# Patient Record
Sex: Male | Born: 1955 | Race: White | Hispanic: No | Marital: Married | Smoking: Former smoker
Health system: Southern US, Community
[De-identification: ages and names within clinical notes are randomized; demographics above are authoritative.]

## PROBLEM LIST (undated history)

## (undated) DIAGNOSIS — M549 Dorsalgia, unspecified: Secondary | ICD-10-CM

## (undated) DIAGNOSIS — F1120 Opioid dependence, uncomplicated: Secondary | ICD-10-CM

## (undated) DIAGNOSIS — G8929 Other chronic pain: Secondary | ICD-10-CM

## (undated) DIAGNOSIS — C801 Malignant (primary) neoplasm, unspecified: Secondary | ICD-10-CM

## (undated) DIAGNOSIS — I639 Cerebral infarction, unspecified: Secondary | ICD-10-CM

## (undated) DIAGNOSIS — I10 Essential (primary) hypertension: Secondary | ICD-10-CM

## (undated) DIAGNOSIS — E785 Hyperlipidemia, unspecified: Secondary | ICD-10-CM

## (undated) HISTORY — PX: CHOLECYSTECTOMY: SHX55

## (undated) HISTORY — PX: APPENDECTOMY: SHX54

## (undated) HISTORY — PX: BACK SURGERY: SHX140

---

## 1987-12-02 DIAGNOSIS — G8929 Other chronic pain: Secondary | ICD-10-CM

## 1987-12-02 HISTORY — DX: Other chronic pain: G89.29

## 2000-11-19 ENCOUNTER — Emergency Department (HOSPITAL_COMMUNITY): Admission: EM | Admit: 2000-11-19 | Discharge: 2000-11-19 | Payer: Self-pay | Admitting: Emergency Medicine

## 2003-01-25 ENCOUNTER — Emergency Department (HOSPITAL_COMMUNITY): Admission: EM | Admit: 2003-01-25 | Discharge: 2003-01-25 | Payer: Self-pay | Admitting: Emergency Medicine

## 2003-01-25 ENCOUNTER — Encounter: Payer: Self-pay | Admitting: Emergency Medicine

## 2003-04-12 ENCOUNTER — Emergency Department (HOSPITAL_COMMUNITY): Admission: EM | Admit: 2003-04-12 | Discharge: 2003-04-12 | Payer: Self-pay | Admitting: Emergency Medicine

## 2003-04-15 ENCOUNTER — Emergency Department (HOSPITAL_COMMUNITY): Admission: EM | Admit: 2003-04-15 | Discharge: 2003-04-15 | Payer: Self-pay

## 2003-09-13 ENCOUNTER — Encounter: Payer: Self-pay | Admitting: Internal Medicine

## 2003-09-13 ENCOUNTER — Encounter: Admission: RE | Admit: 2003-09-13 | Discharge: 2003-09-13 | Payer: Self-pay | Admitting: Internal Medicine

## 2003-10-27 ENCOUNTER — Encounter (INDEPENDENT_AMBULATORY_CARE_PROVIDER_SITE_OTHER): Payer: Self-pay | Admitting: *Deleted

## 2003-10-27 ENCOUNTER — Ambulatory Visit (HOSPITAL_COMMUNITY): Admission: RE | Admit: 2003-10-27 | Discharge: 2003-10-27 | Payer: Self-pay | Admitting: General Surgery

## 2004-03-18 ENCOUNTER — Ambulatory Visit (HOSPITAL_COMMUNITY): Admission: RE | Admit: 2004-03-18 | Discharge: 2004-03-18 | Payer: Self-pay | Admitting: Specialist

## 2004-03-18 ENCOUNTER — Ambulatory Visit (HOSPITAL_BASED_OUTPATIENT_CLINIC_OR_DEPARTMENT_OTHER): Admission: RE | Admit: 2004-03-18 | Discharge: 2004-03-18 | Payer: Self-pay | Admitting: Specialist

## 2004-03-18 ENCOUNTER — Encounter (INDEPENDENT_AMBULATORY_CARE_PROVIDER_SITE_OTHER): Payer: Self-pay | Admitting: Specialist

## 2006-08-06 ENCOUNTER — Inpatient Hospital Stay (HOSPITAL_COMMUNITY): Admission: EM | Admit: 2006-08-06 | Discharge: 2006-08-08 | Payer: Self-pay | Admitting: Emergency Medicine

## 2006-08-07 ENCOUNTER — Encounter: Payer: Self-pay | Admitting: Vascular Surgery

## 2006-09-07 ENCOUNTER — Emergency Department (HOSPITAL_COMMUNITY): Admission: EM | Admit: 2006-09-07 | Discharge: 2006-09-07 | Payer: Self-pay | Admitting: Emergency Medicine

## 2010-01-18 ENCOUNTER — Emergency Department (HOSPITAL_COMMUNITY): Admission: EM | Admit: 2010-01-18 | Discharge: 2010-01-18 | Payer: Self-pay | Admitting: Emergency Medicine

## 2011-04-18 NOTE — H&P (Signed)
NAME:  Gerald Adkins, Gerald Adkins                ACCOUNT NO.:  [id number]   MEDICAL RECORD NO.:  [id number]          PATIENT TYPE:  INP   LOCATION:  1824                         FACILITY:  [facility]   PHYSICIAN:  Melissa L. Ladona Ridgel, MD  DATE OF BIRTH:  1956/01/25   DATE OF ADMISSION:  08/06/2006  DATE OF DISCHARGE:                                HISTORY & PHYSICAL   CHIEF COMPLAINT:  Chest pain with questionable syncope.   HISTORY OF PRESENT ILLNESS:  The patient is a 55 year old white male with a  past medical history of chronic back who states that this afternoon, he  developed the acute onset of chest pain.  This was associated with confusion  and blurred vision.  The patient was working on his car.  He went into the  house to have a bowel movement which seemed to come on him quite quickly.  He states that he had bright red blood per rectum related to this bowel  movement.  He subsequently got up and back to the car to work, when the pain  hit him as he was walking toward the car.  He felt like he was going to fall  down on the ground.  He picked up his cell phone quickly and called for  help.  The patient states that since early May, he has been having on and  off chest pain with activity but he just put it off and decided to change  some of his lifestyle issues to be more heart healthy.  The patient does  have a history of anxiety attacks, but states this is not related.  He  states that he has, during these episodes, gotten short of breath and so he  has unconnected the acetylene from his torches and used the O2 to breathe  which has improved his symptoms.  The patient appears to be reading up on  his medical health and applying certain principals that he is convinced are  applicable to his case.  The patient states he changed his diet.  He does  not eat red meat.  At this point, he states he had to come to the emergency  room because this was worse than previous and different than the last  time.   While in the emergency room, the patient was having an IV inserted when he  developed the acute onset of nausea, sweating, and acutely lost  consciousness and potentially had a respiratory arrest.  The patient was  awakened shortly thereafter, a monitor was not attached and, therefore, we  cannot confirm his rhythm.  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  The patient states he  came around quickly from the event in the dental office, but this sounded  similar to that event.  The patient has had no subsequent chest pain while  in the emergency room, but does complain of some mild abdominal discomfort.   REVIEW OF SYSTEMS:  Chronic pain, see HPI.  He denies any recent weight loss  or gain.  He has not had any nausea and vomiting other than the events  related to today.  All other review of systems appears to be negative.   PAST MEDICAL HISTORY:  A broken back, chronic back pain.  He denies  diabetes.  He states he has slight hypertension.   PAST SURGICAL HISTORY:  Back surgery 16 years ago, appendectomy, and a  cholecystectomy.   SOCIAL HISTORY:  The patient has quit tobacco eight years ago.  He also quit  drinking very heavily a couple of years ago.  He states now he may have 1-2  drinks a week whereas before, he was out in the bars every night.  The  patient is currently disabled.   FAMILY HISTORY:  Mom is living and has some form of cancer which he is not  aware of what type.  A sister has had breast cancer.  His dad is unknown to  him.  He has one son who is married.   ALLERGIES:  No known drug allergies.   MEDICATIONS:  He takes herbs and occasionally purchases some hydrocodone  from a friend for his back pain.   PHYSICAL EXAMINATION:  VITAL SIGNS:  Temperature 96.8, blood pressure 147/86, pulse 64,  respirations 12, saturation 98%.  GENERAL:  Well developed, well nourished, white male in no acute  distress.  HEENT:  Normocephalic, atraumatic, pupils equal, round, reactive to light,  extraocular movements intact, he has poor dentition.  NECK:  Supple, no JVD, no lymph nodes, no carotid bruits.  CHEST:  Decreased but clear.  CARDIOVASCULAR:  Regular rate and rhythm, positive S1 and S2, no S3 or S4,  no murmurs, gallops, and rubs.  ABDOMEN:  Soft with minimal tenderness in the left lower quadrant.  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  The patient states his  mother was a heavy drinker during her pregnancy and he is not aware if she  had been taking thalidomide.Marland Kitchen  NEUROLOGICAL:  The patient is awake, alert and oriented.  Cranial nerves 2-  12 are intact.  Power 5/5.  DTRs 2+.  Plantars are downgoing.  RECTAL:  Exam was requested of the patient in order to examine the bright  red blood, the patient refuses at this time.   He also states he will not take a proton pump inhibitor as it messes him  up.  The patient had been reading up in his health text book and read that  he needed acid for his stomach to feel better, that the book told him that  if he drank apple juice his stomach would improve which is what he has been  doing and he has had no symptoms since.  At this time, he refuses to take  proton pump inhibitor.   LABORATORY DATA:  White count 8.3, hemoglobin 15.3, hematocrit 45.1,  platelets are 284.  Sodium 137, potassium 4.1, chloride 104, CO2 29, BUN 11,  creatinine 0.8.  Point of care enzymes are negative x2.  D-dimer is less  than 0.22.  Chest x-ray is negative.   ASSESSMENT AND PLAN:  This is a 55 year old white male with acute chest pain  and syncope/near syncope.  He also had what appeared to be a syncopal event  versus a respiratory arrest while having IV manipulated in the emergency room.  The patient reports  bright red blood per  rectum with associated loose  stools this afternoon.  At this time, he defers request for rectal exam  secondary to pain.  He also relates he will not take a proton pump  inhibitor as stated above.  At this time, the patient will be admitted to  step down for closer monitoring and will undergo cardiac catheterization as  per cardiology in the a.m.   1. Cardiovascular:  I appreciated Dr. Elvis Coil consultation.  With respect      to the recent syncope and previous history of flat line and/or      syncope I will recommend that the patient be placed in cardiac step      down tonight.  I agree with Lopressor, a lipid panel.  He also may need      to have the aspirin discontinued if he continues to have rectal      bleeding.  2. Pulmonary:  His lungs are clear, his chest x-ray is negative, and his D-      dimer is within normal limits.  I doubt, therefore, that he has a      pulmonary embolus.  I will not be doing a CT chest tonight as he will      be having cardiac catheterization in the morning.  Should he develop      further symptoms, we may need to consider this.  3. GI:  Rectal bleeding, the patient, as stated, defers PPI and rectal      examination.  I will, however, obtain a CT scan of the abdomen and      pelvis without IV contrast in light of his cardiac catheterization in      the a.m.  I will, however, use oral contrast to see if there is any      bowel thickening or areas suggesting possible diverticulitis or related      disease process.  I will request the primary service try to encourage      the patient to take a proton pump inhibitor.  We will also heme check      stools and if he has more bleeding, will repeat his hemoglobin and      hematocrit and consider him for transfusion if he drops his hemoglobin.  4. GU:  There are no complaints.  5. Endocrine:  His blood sugars appear stable on his BMET, will check a      lipid panel and TSH.      Melissa  L. Ladona Ridgel, MD  Electronically Signed     MLT/MEDQ  D:  08/06/2006  T:  08/06/2006  Job:  [id number]

## 2011-04-18 NOTE — Discharge Summary (Addendum)
Gerald Adkins, Gerald Adkins                ACCOUNT NO.:  [id number]   MEDICAL RECORD NO.:  [id number]          PATIENT TYPE:  INP   LOCATION:  6526                         FACILITY:  [facility]   PHYSICIAN:  Jackie Plum, M.D.DATE OF BIRTH:  06/02/1956   DATE OF ADMISSION:  08/06/2006  DATE OF DISCHARGE:  08/08/2006                                 DISCHARGE SUMMARY   PREOPERATIVE DIAGNOSIS:  Chest pain, resolved.   POSTOPERATIVE DIAGNOSES:  Cardiac catheterization indicated RCA stenosis  status post PTCA.  One-vessel coronary artery disease  Dyslipidemia.  Hypertension.  Congenital defect of left hand.   PROCEDURES:  Cardiac catheterization done by Dr. Swaziland on August 07, 2006.  Showed single-vessel obstructive atherosclerotic coronary artery  disease with normal ventricular function.  Status post PTCA of the mid right  coronary artery.  There was unsuccessful PTCA of the distal right coronary  at the crux due to inability to pass a wire into the true lumen of the  vessel.   CONSULTANT:  Dr. Swaziland of cardiology.   CONDITION ON DISCHARGE:  Satisfactory.   REASON FOR ADMISSION:  Chest pain.  Patient presented with chest pain to the  ED on August 06, 2006.  He is said to have had a questionable  episode/spell which is unclear whether it is truly a syncopal episode or  not.  Please see full details regarding patient's presenting symptoms by  reviewing the H&P dictated by Dr. Derenda Mis for full details regarding  patient's presentation.   On admission, patient's cardiac/pulmonary auscultation was unremarkable.  His EKG was normal.  At point of care, cardiac markers were negative.  Chest  pain was consistent with angina and therefore cardiology was consulted.  Patient was seen in consultation by Dr. Swaziland on the day of admission.  He  recommended admitting patient to telemetry to rule out myocardial  infarction, which was effected.  Lipid panel was obtained which indicated  a  total cholesterol of 201 with triglycerides of 160, HDL of 36 which was low,  and LDL of 133 which was high.  He was started on Zocor and he ruled out for  MI.  On account of typical angina presentation, cardiac catheterization was  done which showed above.  Patient has been ambulatory overnight without any  problems.  She had been off her nitroglycerin drip and she had been fine.  Has been cleared by cardiology for discharge today.   Patient is going to be discharged.  To follow up with his PCP, Dr. Concepcion Elk  in about 10-14 days.   He will be continued on:  1. Aspirin 325 mg p.o. daily.  2. Imdur 30 mg daily.  3. ____ QA MARKER: 257 ____ 12.5   4. Lisinopril 40 mg daily.  5. Zocor 40 mg daily.   He will need medical management of his coronary artery disease and continued  followup of his dyslipidemia.  We did monitor his LFTs on statins.  Patient  was discharged home in satisfactory condition.      Jackie Plum, M.D.  Electronically Signed     GO/MEDQ  D:  08/08/2006  T:  08/09/2006  Job:  [id number]   cc:   Fleet Contras, M.D.  Peter M. Swaziland, M.D.

## 2011-04-18 NOTE — Discharge Summary (Signed)
Gerald Adkins, Gerald Adkins                ACCOUNT NO.:  [id number]   MEDICAL RECORD NO.:  [id number]          PATIENT TYPE:  EMS   LOCATION:  MINO                         FACILITY:  [facility]   PHYSICIAN:  Hollice Espy, M.D.DATE OF BIRTH:  [birth year]   DATE OF ADMISSION:  09/07/2006  DATE OF DISCHARGE:  09/07/2006                                 DISCHARGE SUMMARY   DATE OF ER VISIT:  September 06, 2006.   ATTENDING PHYSICIAN:  Dr. Virginia Rochester.   PRIMARY CARE PHYSICIAN:  Patient has no PCP but being referred to Dr. Lucita Ferrara of [place] at [facility] as well as referral to [facility].   HOSPITAL COURSE:  Patient is a 54 year old white male with past medical  history of appendectomy and cholecystectomy in the past who presents to the  emergency room complaining of some abdominal pain.  He also had been  complaining of some rectal bleeding which seemed to be a separate complaint  from his rectal bleeding.  Patient was admitted.  Labs were checked on the  patient who was found to have essentially completely normal labs including a  negative urinalysis, white count, electrolytes, BUN and creatinine.  CT of  the abdomen was done and showed a proximal jejunal ileus and partial  obstruction with evidence of mucosal thickening.  This mucosal thickening  due to lymphostasis could be due to obstruction, infection, inflammation, or  ischemia.  Patient was also noted to have some mild cardiomegaly and some  stable mild bronchitic changes.  The plan initially was to admit the patient  for a partial small bowel obstruction, however, due to a nursing error in  the emergency room, patient was given a tray of food.  He actually ate the  whole thing and told me that he felt much, much better after he received  this.  His belly was feeling markedly better.  He was observed for 1 hour  post meal and had no problems or complaints.  At this point, it was felt  that he could be sent home.   In addition, the ER attending had evaluated the  patient and he was found to have a significant large amount of well sized  hemorrhoids which was felt to be cause of patient's rectal bleeding.  Currently, patient states he is doing fine, denies any headaches, vision  changes, dysphagia, chest pain, palpitations, shortness of breath, wheeze,  cough, abdominal pain, hematuria, dysuria, constipation, diarrhea.  No gross  extremity numbness, weakness, or pain.  Review of systems is otherwise  negative.   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   MEDICATIONS:  None.   ALLERGIES:  He has no known drug allergies.   SOCIAL HISTORY:  He is a smoker, he denies any alcohol or drug use.   FAMILY HISTORY:  Non contributory.   PHYSICAL EXAMINATION:  Patient's  vitals on admission:  Temp 98.0, heart rate  90 since then it has come down to about 84, blood pressure 165/100 came to  as low as 151/90.  Respirations 20, O2 sat 98% on room air.  In general,  patient is alert and oriented x3, no apparent distress.  HEENT:  Normocephalic, atraumatic.  His mucous membranes are moist.  He has  no carotid bruits.  HEART:  Regular rate and rhythm.  S1, S2.  LUNGS:  Clear to auscultation bilaterally.  ABDOMEN:  Soft, mild distention but no tenderness and he does have positive  bowel sounds.  EXTREMITIES:  There is no clubbing or cyanosis or edema.  He has his chronic  left forearm deformity.   LABORATORY DATA:  White count 9.2, no shift.  H and H 12.6, 37.4, CVA 89,  platelet count 259.  UA unremarkable.  Sodium 139, potassium 6.9, chloride  108, bicarbonate 25, BUN 9, creatinine 0.9, glucose 91.  CT is as per HPI.   ASSESSMENT/PLAN:  1. Small bowel obstruction:  Self resolved and he has already been  able to      tolerate p.o.  Plan to discharge the patient home.  2. Hypertension:  His blood pressure has been elevated during this      hospitalization, although, some of this may have been initially due to      pain, but he does have cardiomegaly on his chest x-ray.  He did not      have a PCP and I am going to refer him to Dr. Flonnie Overman so he may evaluate      him, repeat his blood pressure check, and likely start him on blood      pressure medication.  3. Tobacco abuse:  Patient needs counseling and right now has no desire to      quit.  4. Hemorrhoids:  Plan to refer the patient to [facility] for      followup so that he may be able to have these managed.      Hollice Espy, M.D.  Electronically Signed     SKK/MEDQ  D:  09/07/2006  T:  09/08/2006  Job:  [id number]   cc:   Lucita Ferrara, MD  [facility]

## 2011-04-18 NOTE — Op Note (Signed)
NAME:  Gerald Adkins, Gerald Adkins                          ACCOUNT NO.:  [id number]   MEDICAL RECORD NO.:  [id number]                   PATIENT TYPE:  AMB   LOCATION:  DSC                                  FACILITY:  [facility]   PHYSICIAN:  Earvin Hansen L. Shon Hough, M.D.           DATE OF BIRTH:  [birth year]   DATE OF PROCEDURE:  03/18/2004  DATE OF DISCHARGE:                                 OPERATIVE REPORT   INDICATION:  [age]-year-old gentleman with enlarging mass involving his  mid-back, increased pain and discomfort;  the patient has increased pain  such that he cannot lie on it and the area has gotten larger.   PROCEDURES DONE:  Exploration, excision of mass with some difficulty and  plastic closure.   SURGEON:  Yaakov Guthrie. Shon Hough, M.D.   ANESTHESIA:  MAC anesthesia supplemented by 0.5% Xylocaine with epinephrine  1:100,000 concentration, a total of 20 mL.   DESCRIPTION OF PROCEDURE:  Prep was done with Betadine soaping solution and  walled off with sterile towels and drapes so as to make a sterile field.  The mass just measured approximately 4 x 6 cm and was dealt with by making  an incision across transversely down to underlying subcutaneous tissue.  Dissection was carried down with difficulty with Metzenbaum scissors, down  to the underlying fascia, with the mass lying under the fascia of the  musculature.  The fascia was opened and then with delicate dissection, we  were able to tease the mass out, which had a consistency of a sebaceous  cyst.  We were able to do it circumferentially and under it, down to  underlying deeper tissue.  Hemostasis was maintained with the Bovie unit on  coagulation.  The mass did have a feeder vessel coming in from below with a  large diameter; this was ligated with a hemostat and then Bovie on  coagulation.  After this, irrigation was done and all small bleeders were  then controlled again with the Bovie unit on coagulation.  Next, the fascia  was reclosed  with 3-0 Vicryl, the subcutaneous tissue closed with 3-0 Vicryl  x2 layers, a subdermal suture of 5-0 Vicryl was placed and then a running  subcuticular stitch of 5-0 Vicryl.  Steri-Strips and soft dressings were  applied.  A #7 Blake drain was placed in the depths of the wound and brought  out through the lateral-most portion of the incision and secured with 3-0  Prolene.  After a sterile dressing had been placed, the patient was placed  in the bed and taken to the recovery in excellent condition.   ESTIMATED BLOOD LOSS:  Less than 50 mL.   COMPLICATIONS:  None.  Yaakov Guthrie. Shon Hough, M.D.    Cathie Hoops  D:  03/18/2004  T:  03/19/2004  Job:  [id number]

## 2011-04-18 NOTE — Consult Note (Signed)
NAME:  EUCLID, CASSETTA NO.:  [id number]   MEDICAL RECORD NO.:  [id number]          PATIENT TYPE:  EMS   LOCATION:  MAJO                         FACILITY:  [facility]   PHYSICIAN:  Peter M. Swaziland, M.D.  DATE OF BIRTH:  [birth year]   DATE OF CONSULTATION:  08/06/2006  DATE OF DISCHARGE:                                   CONSULTATION   HISTORY OF PRESENT ILLNESS:  Mr. Mcnab is a 55 year old white male who  presented to the emergency department today for evaluation of chest pain.  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  He describes the pain in the midchest as if some  heavy weight is sitting on his chest associated with shortness of breath,  sweating, nausea and weakness.  He states he has had similar episodes of  chest pain throughout the summer with exertion that typically resolve with  rest but this episode was more severe and lasted longer.  He also had some  associated tingling and numbness in his right leg.   In the emergency department, the nurse had just started an IV in his left  arm.  She was talking with the patient when he suddenly stopped breathing  and became unresponsive.  She believes the patient was asystolic on the  monitor but no recording was made.  With stimulation the patient start  breathing again and came to.   PAST MEDICAL HISTORY:  1. Borderline hypertension.  2. History of fracture of his back.  3. Congenital defect of his left hand.   MEDICATIONS:  Include p.r.n. hydrocodone he takes psyllium hulls and cayenne  pepper tablets over-the-counter.   ALLERGIES:  He is allergic to BUTTERSCOTCH.   FAMILY HISTORY:  Positive for cancer in multiple family members.  There is  no history of coronary disease.  He does not know his father's history.   SOCIAL HISTORY:  He is disabled.  He is married.  He has one son.  He has a  33 pack-year history of smoking but quit 8 years ago.  Drinks  alcohol  approximately two times per week.   REVIEW OF SYSTEMS:  The patient does report chronic history of rectal  bleeding, now occurring approximately one to two times per month.  Other  review of systems is negative.   PHYSICAL EXAM:  Patient is well-developed white male in no distress.  He  appears older than his stated age.  Blood pressure 147/86, pulse 64, sinus rhythm.  He is afebrile.  Sats are  98%.  HEENT:  He has a heavy beard.  His pupils equal round reactive light  accommodation.  Extraocular movements were full.  Oropharynx is clear.  Neck  is supple without JVD, adenopathy, thyromegaly or bruits.  LUNGS:  Were clear to auscultation percussion.  CARDIAC:  Exam reveals regular rate and rhythm without gallop, murmur, rub  or click.  ABDOMEN:  Soft, nontender without masses or hepatosplenomegaly.  Femoral pedal pulses were 2+ and symmetric.  NEUROLOGIC:  Exam is nonfocal.   LABORATORY  DATA:  ECG is normal.  Chest x-ray:  Shows no active disease.  White count 8300, hemoglobin 15.3, hematocrit 45.1, platelets 284,000.  Sodium is 137, potassium 4.1, chloride 104, BUN 11, glucose 103, creatinine  is 0.8.  CPK-MB and troponins were negative x2. D-dimer is less than 0.22.   IMPRESSION:  1. Chest pain history is very consistent with stable angina pectoris.  2. Prior tobacco use.  3. Elevated blood pressure  4. History of rectal bleeding.  5. Apparent episode of asystole respiratory arrest resolve spontaneously.      Question vagally mediated since it occurred after initiation of IV.   PLAN:  Will admit to telemetry, rule out myocardial infarction, be treated  with aspirin, Lovenox subcu, Lopressor and ACE inhibitor. Will check lipid  panel.  We will plan on doing cardiac catheterization tomorrow to evaluate  his coronary status.           ______________________________  Peter M. Swaziland, M.D.     PMJ/MEDQ  D:  08/06/2006  T:  08/07/2006  Job:  [id number]   cc:   Jackie Plum, M.D.

## 2011-04-18 NOTE — Op Note (Signed)
NAME:  Gerald Adkins, Gerald Adkins                          ACCOUNT NO.:  [id number]   MEDICAL RECORD NO.:  [id number]                   PATIENT TYPE:  OIB   LOCATION:  NA                                   FACILITY:  [facility]   PHYSICIAN:  Jimmye Norman III, M.D.               DATE OF BIRTH:  1956-08-12   DATE OF PROCEDURE:  10/27/2003  DATE OF DISCHARGE:                                 OPERATIVE REPORT   PREOPERATIVE DIAGNOSIS:  Symptomatic cholelithiasis.   POSTOPERATIVE DIAGNOSIS:  Symptomatic cholelithiasis.   PROCEDURE:  Laparoscopic cholecystectomy.   SURGEON:  Jimmye Norman, M.D.   ASSISTANT:  None.   ANESTHESIA:  General endotracheal.   ESTIMATED BLOOD LOSS:  Less than 20 mL.   COMPLICATIONS:  None.   CONDITION:  Stable.   INDICATIONS FOR OPERATION:  The patient is a 55 year old male with abdominal  pain, right upper quadrant, and gallstones on ultrasound.  He comes in for a  laparoscopic cholecystectomy.   FINDINGS:  The patient had adhesions to the infundibulum of the gallbladder,  which were easily removed, including the duodenum.  He had two large stones  within the gallbladder and a normal-size common duct and cystic duct.   OPERATION:  The patient was taken to the operating room and placed on the  table in the supine position.  After an adequate endotracheal anesthetic was  administered, he was prepped and draped in the usual sterile manner,  exposing the midline in the right upper quadrant.   With the patient in reverse Trendelenburg position, a supraumbilical  curvilinear incision was made using a #11 blade.  It was taken down to the  midline fascia, through which a Veress needle was passed into the peritoneal  cavity and confirmed to be in position with the saline test.  Once this was  done, carbon dioxide insufflation was instilled through the Veress needle  into the peritoneal cavity up to a maximum intra-abdominal pressure of 15  mmHg.   Subsequently, two right  costal margin 5 mm cannulas and a subxiphoid 11-12  mm cannula were passed under direct vision into the peritoneal cavity.  With  all cannulas in place, the patient was placed in a steeper reversed  Trendelenburg.  The left side was tilted down and the dissection begun.   Because of the adhesions to the infundibulum of the gallbladder, we had to  be careful with retraction initially through the lateralmost 5 mm cannula.  We did take it towards the right upper quadrant and the anterior abdominal  wall.  However, as we were doing so, we did an adhesiolysis of those  adhesions, freeing up the infundibulum for a better retraction.  We were  then able to expose the peritoneum overlying the triangle of Calot and  hepatoduodenal triangle.  We dissected up the cystic duct and the cystic  artery and these peritoneal structures and isolated them well enough  to see  windows on both sides above the artery and the duct.  We placed Endoclips on  both the cystic duct and the cystic artery proximally and distally with  triple clips on the cystic duct.  We subsequently transected it and then  dissected out the gallbladder from its bed with minimal difficulty.  There  was no entrance into the gallbladder itself.   We then brought the gallbladder out the supraumbilical site with minimal  difficulty, although we did have to stretch the fascia a bit because of the  large size of the stones.  We inspected the gallbladder bed where there was  minimal bleeding.  We did cauterize somewhat; however, we used less than a  bag of irrigation for hemostasis and irrigation.  There was no biliary  spillage or leakage.  Once the irrigation and coagulation were done, we  removed all cannulas, aspirating above the liver as we did so.  We removed  all cannulas and then closed the supraumbilical site using a figure-of-eight  stitch of 0 Vicryl on a UR-6 needle.  We then injected at all sites with  0.25% Marcaine with  epinephrine and closed the skin using running  subcuticular stitch of 4-0 Vicryl.  All needle counts, sponge counts, and  instrument counts were correct.  The patient was taken to the recovery room  in a stable condition with sterile dressings applied to all wounds.                                               Kathrin Ruddy, M.D.    JW/MEDQ  D:  10/27/2003  T:  10/27/2003  Job:  [id number]

## 2011-04-18 NOTE — Cardiovascular Report (Signed)
NAME:  BROWN, DUNLAP NO.:  [id number]   MEDICAL RECORD NO.:  [id number]          PATIENT TYPE:  INP   LOCATION:  2807                         FACILITY:  [facility]   PHYSICIAN:  Peter M. Swaziland, M.D.  DATE OF BIRTH:  June 18, 1956   DATE OF PROCEDURE:  DATE OF DISCHARGE:                              CARDIAC CATHETERIZATION   INDICATIONS FOR PROCEDURE:  A 55 year old male with hypercholesterolemia and  prior tobacco abuse who presents with unstable angina.   PROCEDURES:  1. Left heart catheterization.  2. Coronary and left ventricular angiography.  3. Percutaneous balloon angioplasty of the right coronary artery.   ACCESS:  Via the right femoral artery using the standard Seldinger  technique.   EQUIPMENT:  A 6-French 4-cm right and left Judkins catheter, 6-French  pigtail catheter, 6-French arterial sheath, 6-French FR-4 guide with side  holes 0.014.  PT2 moderate strength wire, a 2.5 x 20-mm Quantum balloon, and  a 3 x 15-mm Maverick balloon.   CONTRAST:  Omnipaque 260 ml.   MEDICATIONS:  1. Versed a total of 3 mg IV.  2. Fentanyl 25 mcg IV.  3. Integrilin double bolus 180 mcg/kg followed by continuous infusion of 2      mcg/kg per minute.  4. Nitroglycerin 200 mcg intracoronary x3.  5. Heparin 4500 units IV.  6. Atropine 0.5 mg IV.   HEMODYNAMIC DATA:  1. Aortic pressure 163/91 with a mean of 120.  2. Left ventricle pressure is 160 with EDP of 17-mmHg.   ANGIOGRAPHIC DATA:  1. The left coronary artery arises and distributes normally.  The left      main coronary artery is short and normal.  2. The left anterior descending artery is normal.  3. The left circumflex coronary artery has mild disease in the mid vessel      up to 20%.  4. The right coronary artery is a dominant vessel.  It has a focal 90%      stenosis in the mid vessel.  There is then a long stenosis at the crux      up to 99.9%.  There is TIMI II flow distally.  5. The PDA and  posterolateral branches fill by well-established left-to-      right collaterals.   LEFT VENTRICULAR ANGIOGRAPHY:  Performed in the RAO view demonstrates normal  left ventricular size and contractility with normal systolic function.  Ejection fraction is estimated at 55%.  There is no mitral regurgitation or  prolapse.   We proceeded this point with attempt at intervention of the tandem lesions  in the right coronary artery.  Using the above equipment, we were able to  cross the lesion in the mid right coronary without difficulty.  However, the  distal lesion was very difficult to cross.  It behaved like a chronic total  occlusion.  We were unable to cross with a high-torque floppy wire or a  Prowater wire.  With much difficulty, we were able to cross with a PT2 wire  but there was significant resistance in crossing.  We, therefore, used a  dock wire  and placed a transit catheter in the distal right coronary artery.  We then withdrew the wire and performed dye injection through the transit  catheter.  This demonstrated opacification of the distal right coronary  branches including the PDA and posterolateral branches but the distal right  coronary was not opacified.  This was felt to be consistent with subintimal  position of the wire with re-entry into the true lumen distally.  We  replaced a high-torque floppy wire down the transit catheter and withdrew  the transit catheter.  We did do a low-level balloon inflation, using a 2.5-  mm Quantum balloon but still we were unable to establish any flow past the  crux of the right coronary artery.   We then addressed the mid right coronary lesion.  We dilated this using the  2.5-mm Quantum up to 16 atmospheres.  We then exchanged for a 3 x 15-mm  Maverick balloon and dilated to 8 atmospheres.  This yielded a good  angiographic result here with less than 20% residual stenosis.  I do not  elect to put a stent in this lesion, since I was  concerned about the distal  runoff.  The patient tolerated the procedure very well.  In the procedure in  the mid right coronary lesion was reduced to less than 20%.  The lesion at  the crux was unchanged with a still 99.9% obstruction and TIMI grade 2 flow.  The patient tolerated procedure very well, in large part due to the good  collateral support from the LAD.  He remained pain free throughout procedure  and had no ST-segment changes.  It is felt at this point that further  intervention of the distal coronary could not be performed and we  recommended aggressive medical therapy.   FINAL INTERPRETATION:  1. Single-vessel obstructive atherosclerotic coronary artery disease.  2. Normal left ventricular function.  3. Successful percutaneous transluminal coronary angioplasty of the mid      right coronary.  4. Unsuccessful percutaneous transluminal coronary angioplasty of the      distal right coronary at the crux due to inability to pass a wire into      the true lumen of the vessel.           ______________________________  Peter M. [place], M.D.     PMJ/MEDQ  D:  08/07/2006  T:  08/07/2006  Job:  [id number]   cc:   Jackie Plum, M.D.

## 2013-04-19 ENCOUNTER — Encounter (HOSPITAL_COMMUNITY): Payer: Self-pay | Admitting: Emergency Medicine

## 2013-04-19 ENCOUNTER — Emergency Department (HOSPITAL_COMMUNITY)
Admission: EM | Admit: 2013-04-19 | Discharge: 2013-04-20 | Disposition: A | Discharge status: Home / Self Care | Payer: Medicare Other | Attending: Emergency Medicine | Admitting: Emergency Medicine

## 2013-04-19 DIAGNOSIS — R300 Dysuria: Secondary | ICD-10-CM | POA: Insufficient documentation

## 2013-04-19 DIAGNOSIS — Z8673 Personal history of transient ischemic attack (TIA), and cerebral infarction without residual deficits: Secondary | ICD-10-CM | POA: Insufficient documentation

## 2013-04-19 DIAGNOSIS — R3911 Hesitancy of micturition: Secondary | ICD-10-CM | POA: Insufficient documentation

## 2013-04-19 DIAGNOSIS — K644 Residual hemorrhoidal skin tags: Secondary | ICD-10-CM | POA: Insufficient documentation

## 2013-04-19 DIAGNOSIS — R109 Unspecified abdominal pain: Secondary | ICD-10-CM

## 2013-04-19 DIAGNOSIS — K6289 Other specified diseases of anus and rectum: Secondary | ICD-10-CM | POA: Insufficient documentation

## 2013-04-19 DIAGNOSIS — R42 Dizziness and giddiness: Secondary | ICD-10-CM | POA: Insufficient documentation

## 2013-04-19 DIAGNOSIS — K625 Hemorrhage of anus and rectum: Secondary | ICD-10-CM | POA: Insufficient documentation

## 2013-04-19 DIAGNOSIS — R110 Nausea: Secondary | ICD-10-CM | POA: Insufficient documentation

## 2013-04-19 DIAGNOSIS — K921 Melena: Secondary | ICD-10-CM | POA: Insufficient documentation

## 2013-04-19 DIAGNOSIS — Z79899 Other long term (current) drug therapy: Secondary | ICD-10-CM | POA: Insufficient documentation

## 2013-04-19 DIAGNOSIS — K649 Unspecified hemorrhoids: Secondary | ICD-10-CM

## 2013-04-19 HISTORY — DX: Cerebral infarction, unspecified: I63.9

## 2013-04-19 LAB — COMPREHENSIVE METABOLIC PANEL
ALT: 23 U/L (ref 0–53)
Albumin: 3.6 g/dL (ref 3.5–5.2)
CO2: 22 mEq/L (ref 19–32)
Chloride: 98 mEq/L (ref 96–112)
GFR calc Af Amer: >90 mL/min (ref >90)
GFR calc non Af Amer: >90 mL/min (ref >90)
Glucose, Bld: 132 mg/dL — ABNORMAL HIGH (ref 70–99)
Potassium: 3.5 mEq/L (ref 3.5–5.1)

## 2013-04-19 LAB — URINALYSIS, ROUTINE W REFLEX MICROSCOPIC
Glucose, UA: NEGATIVE mg/dL
Hgb urine dipstick: NEGATIVE
Ketones, ur: 15 mg/dL — AB
Leukocytes, UA: NEGATIVE
Protein, ur: 30 mg/dL — AB
Specific Gravity, Urine: 1.022 (ref 1.005–1.030)
Urobilinogen, UA: 1 mg/dL (ref 0.0–1.0)
pH: 6 (ref 5.0–8.0)

## 2013-04-19 LAB — CBC WITH DIFFERENTIAL/PLATELET
Basophils Relative: 0 % (ref 0–1)
HCT: 43.9 % (ref 39.0–52.0)
Hemoglobin: 15 g/dL (ref 13.0–17.0)
Lymphocytes Relative: 27 % (ref 12–46)
MCH: 28.3 pg (ref 26.0–34.0)
MCHC: 34.2 g/dL (ref 30.0–36.0)
Platelets: 250 10*3/uL (ref 150–400)
RBC: 5.3 MIL/uL (ref 4.22–5.81)

## 2013-04-19 LAB — GRAM STAIN

## 2013-04-19 LAB — URINE MICROSCOPIC-ADD ON

## 2013-04-19 LAB — LIPASE, BLOOD: Lipase: 19 U/L (ref 11–59)

## 2013-04-19 MED ORDER — LIDOCAINE HCL 2 % EX GEL
Freq: Once | CUTANEOUS | Status: AC
  Administered 2013-04-19: 20 via URETHRAL

## 2013-04-19 MED ORDER — IBUPROFEN 400 MG PO TABS
600.0000 mg | ORAL_TABLET | Freq: Once | ORAL | Status: AC
  Administered 2013-04-19: 600 mg via ORAL
  Filled 2013-04-19: qty 2

## 2013-04-19 NOTE — ED Notes (Signed)
Pt states he hasn't been able to void x 2 days, c/o abdominal pain 10/10.

## 2013-04-19 NOTE — ED Notes (Signed)
Pt taken to room D35

## 2013-04-19 NOTE — ED Notes (Signed)
States that his prostate has shut down and  He has not been able to pee well and has had diarrhea since sunday

## 2013-04-19 NOTE — ED Provider Notes (Signed)
History     CSN: [id number]  Arrival date & time 04/19/13  1308   First MD Initiated Contact with Patient 04/19/13 1651      Chief Complaint  Patient presents with  . Rectal bleeding    Patient is a 57 y.o. male presenting with hematochezia.  Rectal Bleeding Quality:  Bright red Amount:  Moderate Duration:  4 days Timing:  Intermittent Progression:  Unchanged Chronicity:  New Context: constipation, defecation, hemorrhoids and rectal pain   Pain details:    Quality:  Sharp   Severity:  Moderate   Duration:  4 days   Timing:  Worse with defecation   Progression:  Unchanged Similar prior episodes: yes   Relieved by:  Time Worsened by:  Defecation Ineffective treatments:  None tried Associated symptoms: abdominal pain and light-headedness   Associated symptoms: no fever, no hematemesis and no vomiting   Risk factors: no anticoagulant use, no hx of colorectal cancer, no hx of colorectal surgery and no liver disease    Additionally, he reports that he has had decreased urine output.  He has had a mild, cramping lower abdominal pain, aggravated by nothing, relieved by bowel movements.  Although he was constipated several days ago, which he believe flared up his hemorrhoids, he is currently having diarrhea; it is non-bloody, with no mucus; he has been having 5-10 episodes per day for about 3 days.  He has had no recent travel.  No fever, no chills.  Has had some nausea but no vomiting.    Past Medical History  Diagnosis Date  . Stroke     No past surgical history on file.  No family history on file.  History  Substance Use Topics  . Smoking status: Former Games developer  . Smokeless tobacco: Not on file  . Alcohol Use: Yes      Review of Systems  Constitutional: Negative for fever.  Gastrointestinal: Positive for nausea, abdominal pain, diarrhea, hematochezia, anal bleeding and rectal pain. Negative for vomiting and hematemesis.  Genitourinary: Positive for decreased urine  volume and difficulty urinating. Negative for dysuria, hematuria, flank pain, discharge, genital sores and testicular pain.  Neurological: Positive for light-headedness.  All other systems reviewed and are negative.    Allergies  Review of patient's allergies indicates no known allergies.  Home Medications   Current Outpatient Rx  Name  Route  Sig  Dispense  Refill  . Krill Oil 500 MG CAPS   Oral   Take 1 capsule by mouth daily.         . Probiotic Product (PROBIOTIC DAILY PO)   Oral   Take 1 capsule by mouth daily.           BP 143/103  Pulse 116  Temp(Src) 98.2 F (36.8 C) (Oral)  Resp 16  SpO2 97%  Physical Exam  Nursing note and vitals reviewed. Constitutional: He is oriented to person, place, and time. He appears well-developed and well-nourished. No distress.  HENT:  Head: Normocephalic and atraumatic.  Mouth/Throat: Oropharynx is clear and moist.  Eyes: Conjunctivae and EOM are normal. Pupils are equal, round, and reactive to light. No scleral icterus.  Neck: Normal range of motion. Neck supple. No JVD present.  Cardiovascular: Normal rate, regular rhythm, normal heart sounds and intact distal pulses.  Exam reveals no gallop and no friction rub.   No murmur heard. Pulmonary/Chest: Effort normal and breath sounds normal. No respiratory distress. He has no wheezes. He has no rales.  Abdominal: Soft. Normal appearance  and bowel sounds are normal. He exhibits no distension. There is no hepatosplenomegaly. There is no tenderness. There is no rigidity, no rebound, no guarding, no CVA tenderness, no tenderness at McBurney's point and negative Murphy's sign. No hernia.  Genitourinary: Testes normal and penis normal. Rectal exam shows external hemorrhoid (multiple, large, non-thrombosed ). Rectal exam shows no mass. Guaiac positive stool. Prostate is enlarged. Prostate is not tender.  Musculoskeletal: He exhibits no edema.  Neurological: He is alert and oriented to  person, place, and time. No cranial nerve deficit. He exhibits normal muscle tone. Coordination normal.  Skin: Skin is warm and dry. He is not diaphoretic.    ED Course  Procedures (including critical care time)  Labs Reviewed  CBC WITH DIFFERENTIAL - Abnormal; Notable for the following:    RDW 16.1 (*)    All other components within normal limits  COMPREHENSIVE METABOLIC PANEL - Abnormal; Notable for the following:    Glucose, Bld 132 (*)    All other components within normal limits  URINALYSIS, ROUTINE W REFLEX MICROSCOPIC - Abnormal; Notable for the following:    Color, Urine AMBER (*)    APPearance HAZY (*)    Bilirubin Urine SMALL (*)    Ketones, ur 15 (*)    Protein, ur 30 (*)    All other components within normal limits  URINE MICROSCOPIC-ADD ON - Abnormal; Notable for the following:    Casts HYALINE CASTS (*)    All other components within normal limits  GRAM STAIN  URINE CULTURE  LIPASE, BLOOD  URINALYSIS, ROUTINE W REFLEX MICROSCOPIC   No results found.   1. Abdominal pain   2. Hemorrhoids   3. Bleeding hemorrhoid   4. Urinary hesitancy       MDM  57 yo M presents c/o rectal bleeding, rectal pain, large hemorrhoids for about 4 days.  Associated with mild abd discomfort, nausea, constipation a few day ago followed by diarrhea for the past several days; decreased urine output with urinary hesitancy. On exam, he is initially tachycardic to 116; otherwise normal vitals.  Tachycardia resolved with IVF.  He is non-toxic appearing, in NAD.  abd benign.  Has large, non-thrombosed hemorrhoids.  Moderate, bright red, gross blood on rectal exam.  Prostate mildly enlarged, non-tender.  Hemoglobin stable, CMP unremarkable, no leukocytosis, UA not suggestive of UTI.  Bladder exam, bedside ultrasound, and ultimately in and out cath do not demonstrate urinary retention  Given benign abd exam, well appearing patient, normal labs, stable for discharge.  We discussed management  of his hemorrhoids including diet, Sitz baths, water intake; he is to f/u with his PCP tomorrow for reevaluation.  Gave return precautions.           Toney Sang, MD 04/20/13 [phone]

## 2013-04-19 NOTE — ED Notes (Signed)
Patient has not been able to urinate for urinalysis. Is aware sample is needed.

## 2013-04-22 NOTE — ED Provider Notes (Signed)
I saw and evaluated the patient, reviewed the resident's note and I agree with the findings and plan.     Toy Baker, MD 04/22/13 [phone]

## 2018-08-22 ENCOUNTER — Encounter (HOSPITAL_COMMUNITY): Payer: Self-pay | Admitting: *Deleted

## 2018-08-22 ENCOUNTER — Emergency Department (HOSPITAL_COMMUNITY): Payer: Medicare Other

## 2018-08-22 ENCOUNTER — Other Ambulatory Visit: Payer: Self-pay

## 2018-08-22 ENCOUNTER — Inpatient Hospital Stay (HOSPITAL_COMMUNITY)
Admission: EM | Admit: 2018-08-22 | Discharge: 2018-08-26 | DRG: 824 | Disposition: A | Discharge status: Home / Self Care | Payer: Medicare Other | Attending: Internal Medicine | Admitting: Internal Medicine

## 2018-08-22 DIAGNOSIS — D509 Iron deficiency anemia, unspecified: Secondary | ICD-10-CM | POA: Diagnosis present

## 2018-08-22 DIAGNOSIS — C383 Malignant neoplasm of mediastinum, part unspecified: Secondary | ICD-10-CM | POA: Diagnosis present

## 2018-08-22 DIAGNOSIS — Z8674 Personal history of sudden cardiac arrest: Secondary | ICD-10-CM

## 2018-08-22 DIAGNOSIS — Z91018 Allergy to other foods: Secondary | ICD-10-CM | POA: Diagnosis not present

## 2018-08-22 DIAGNOSIS — C439 Malignant melanoma of skin, unspecified: Secondary | ICD-10-CM | POA: Diagnosis not present

## 2018-08-22 DIAGNOSIS — R0602 Shortness of breath: Secondary | ICD-10-CM

## 2018-08-22 DIAGNOSIS — R1110 Vomiting, unspecified: Secondary | ICD-10-CM | POA: Diagnosis present

## 2018-08-22 DIAGNOSIS — Z23 Encounter for immunization: Secondary | ICD-10-CM

## 2018-08-22 DIAGNOSIS — Z79899 Other long term (current) drug therapy: Secondary | ICD-10-CM | POA: Diagnosis not present

## 2018-08-22 DIAGNOSIS — D491 Neoplasm of unspecified behavior of respiratory system: Secondary | ICD-10-CM

## 2018-08-22 DIAGNOSIS — I871 Compression of vein: Secondary | ICD-10-CM | POA: Diagnosis present

## 2018-08-22 DIAGNOSIS — C3411 Malignant neoplasm of upper lobe, right bronchus or lung: Secondary | ICD-10-CM | POA: Diagnosis present

## 2018-08-22 DIAGNOSIS — R222 Localized swelling, mass and lump, trunk: Secondary | ICD-10-CM

## 2018-08-22 DIAGNOSIS — M549 Dorsalgia, unspecified: Secondary | ICD-10-CM | POA: Diagnosis present

## 2018-08-22 DIAGNOSIS — I700 Atherosclerosis of aorta: Secondary | ICD-10-CM | POA: Diagnosis present

## 2018-08-22 DIAGNOSIS — Z87891 Personal history of nicotine dependence: Secondary | ICD-10-CM

## 2018-08-22 DIAGNOSIS — Z8673 Personal history of transient ischemic attack (TIA), and cerebral infarction without residual deficits: Secondary | ICD-10-CM | POA: Diagnosis not present

## 2018-08-22 DIAGNOSIS — C770 Secondary and unspecified malignant neoplasm of lymph nodes of head, face and neck: Secondary | ICD-10-CM | POA: Diagnosis present

## 2018-08-22 DIAGNOSIS — E785 Hyperlipidemia, unspecified: Secondary | ICD-10-CM | POA: Diagnosis present

## 2018-08-22 DIAGNOSIS — I2510 Atherosclerotic heart disease of native coronary artery without angina pectoris: Secondary | ICD-10-CM | POA: Diagnosis present

## 2018-08-22 DIAGNOSIS — F17200 Nicotine dependence, unspecified, uncomplicated: Secondary | ICD-10-CM | POA: Diagnosis not present

## 2018-08-22 DIAGNOSIS — G8929 Other chronic pain: Secondary | ICD-10-CM | POA: Diagnosis present

## 2018-08-22 DIAGNOSIS — C7801 Secondary malignant neoplasm of right lung: Secondary | ICD-10-CM | POA: Diagnosis not present

## 2018-08-22 DIAGNOSIS — I10 Essential (primary) hypertension: Secondary | ICD-10-CM | POA: Diagnosis present

## 2018-08-22 DIAGNOSIS — C781 Secondary malignant neoplasm of mediastinum: Secondary | ICD-10-CM | POA: Diagnosis not present

## 2018-08-22 HISTORY — DX: Essential (primary) hypertension: I10

## 2018-08-22 HISTORY — DX: Dorsalgia, unspecified: M54.9

## 2018-08-22 HISTORY — DX: Other chronic pain: G89.29

## 2018-08-22 HISTORY — DX: Opioid dependence, uncomplicated: F11.20

## 2018-08-22 HISTORY — DX: Hyperlipidemia, unspecified: E78.5

## 2018-08-22 LAB — CBC
HEMATOCRIT: 30.8 % — AB (ref 39.0–52.0)
Hemoglobin: 8.3 g/dL — ABNORMAL LOW (ref 13.0–17.0)
MCH: 18.9 pg — AB (ref 26.0–34.0)
MCHC: 26.9 g/dL — ABNORMAL LOW (ref 30.0–36.0)
MCV: 70.2 fL — AB (ref 78.0–100.0)
Platelets: 535 10*3/uL — ABNORMAL HIGH (ref 150–400)
RBC: 4.39 MIL/uL (ref 4.22–5.81)
RDW: 17.3 % — ABNORMAL HIGH (ref 11.5–15.5)
WBC: 11.1 10*3/uL — ABNORMAL HIGH (ref 4.0–10.5)

## 2018-08-22 LAB — BASIC METABOLIC PANEL
Anion gap: 11 (ref 5–15)
BUN: 8 mg/dL (ref 6–20)
CHLORIDE: 99 mmol/L (ref 98–111)
CO2: 24 mmol/L (ref 22–32)
CREATININE: 0.62 mg/dL (ref 0.61–1.24)
Calcium: 8.6 mg/dL — ABNORMAL LOW (ref 8.9–10.3)
GFR calc non Af Amer: >60 mL/min (ref >60)
Glucose, Bld: 100 mg/dL — ABNORMAL HIGH (ref 70–99)
POTASSIUM: 4.1 mmol/L (ref 3.5–5.1)
Sodium: 134 mmol/L — ABNORMAL LOW (ref 135–145)

## 2018-08-22 LAB — I-STAT TROPONIN, ED: Troponin i, poc: 0 ng/mL (ref 0.00–0.08)

## 2018-08-22 LAB — TYPE AND SCREEN
ABO/RH(D): O POS
Antibody Screen: NEGATIVE

## 2018-08-22 LAB — PROTIME-INR
INR: 1.1
Prothrombin Time: 14.1 seconds (ref 11.4–15.2)

## 2018-08-22 LAB — BRAIN NATRIURETIC PEPTIDE: B NATRIURETIC PEPTIDE 5: 47 pg/mL (ref 0.0–100.0)

## 2018-08-22 LAB — APTT: aPTT: 35 seconds (ref 24–36)

## 2018-08-22 LAB — SURGICAL PCR SCREEN
MRSA, PCR: NEGATIVE
Staphylococcus aureus: NEGATIVE

## 2018-08-22 MED ORDER — POLYETHYLENE GLYCOL 3350 17 G PO PACK: 17.0000 g | PACK | Freq: Every day | ORAL | Status: DC | PRN

## 2018-08-22 MED ORDER — ACETAMINOPHEN 650 MG RE SUPP: 650.0000 mg | Freq: Four times a day (QID) | RECTAL | Status: DC | PRN

## 2018-08-22 MED ORDER — ENOXAPARIN SODIUM 40 MG/0.4ML ~~LOC~~ SOLN: 40.0000 mg | SUBCUTANEOUS | Status: DC

## 2018-08-22 MED ORDER — PNEUMOCOCCAL VAC POLYVALENT 25 MCG/0.5ML IJ INJ
0.5000 mL | INJECTION | INTRAMUSCULAR | Status: DC
  Filled 2018-08-22: qty 0.5

## 2018-08-22 MED ORDER — CEFAZOLIN SODIUM-DEXTROSE 2-4 GM/100ML-% IV SOLN
2.0000 g | INTRAVENOUS | Status: AC
  Administered 2018-08-23: 2 g via INTRAVENOUS
  Filled 2018-08-22: qty 100

## 2018-08-22 MED ORDER — ACETAMINOPHEN 325 MG PO TABS
650.0000 mg | ORAL_TABLET | Freq: Four times a day (QID) | ORAL | Status: DC | PRN
  Administered 2018-08-23: 650 mg via ORAL
  Filled 2018-08-22: qty 2

## 2018-08-22 MED ORDER — HEPARIN SODIUM (PORCINE) 5000 UNIT/ML IJ SOLN
5000.0000 [IU] | Freq: Three times a day (TID) | INTRAMUSCULAR | Status: DC
  Filled 2018-08-22: qty 1

## 2018-08-22 MED ORDER — CHLORHEXIDINE GLUCONATE CLOTH 2 % EX PADS
6.0000 | MEDICATED_PAD | Freq: Once | CUTANEOUS | Status: AC
  Administered 2018-08-22: 6 via TOPICAL

## 2018-08-22 MED ORDER — IOPAMIDOL (ISOVUE-370) INJECTION 76%
INTRAVENOUS | Status: AC
  Administered 2018-08-22: 100 mL
  Filled 2018-08-22: qty 100

## 2018-08-22 MED ORDER — INFLUENZA VAC SPLIT QUAD 0.5 ML IM SUSY: 0.5000 mL | PREFILLED_SYRINGE | INTRAMUSCULAR | Status: DC

## 2018-08-22 MED ORDER — CHLORHEXIDINE GLUCONATE CLOTH 2 % EX PADS
6.0000 | MEDICATED_PAD | Freq: Once | CUTANEOUS | Status: AC
  Administered 2018-08-23: 6 via TOPICAL

## 2018-08-22 NOTE — ED Notes (Signed)
Pt returned from CT °

## 2018-08-22 NOTE — ED Notes (Signed)
Patient transported to CT 

## 2018-08-22 NOTE — ED Notes (Signed)
Pt reports severe syncopal reactions to blood draws in past, will wait to get labs drawn when in bed.

## 2018-08-22 NOTE — Consult Note (Signed)
[address]       [place] [address]             [phone]        Gerald Adkins [place] Medical Record #[id number] Date of Birth: 07-Dec-1955  Referring:Olson, Garen Lah, MD Primary Care: [name], MD Primary Cardiologist:No primary care provider on file.  Chief Complaint:    Chief Complaint  Patient presents with  . Shortness of Breath    History of Present Illness:     Patient is a 62 year old male who notes approximately 1 month history of increasing cough whenever he lays on his back, his symptoms progressed until he came to the emergency room this morning with cough and left side of his face swollen.  Chest x-ray and CT scan confirmed a large mediastinal mass extending to the right neck.  Since admission the patient notes that he feels much better respiratory wise.  He has had 40 pound weight loss over the last 6 months.  Patient is a long-term smoker.  He notes over the years he has had 2 episodes of "cardiac arrest", he thinks he had an angioplasty in the past.  Has a previous history of stroke   Current Activity/ Functional Status: Patient is independent with mobility/ambulation, transfers, ADL's, IADL's.   Zubrod Score: At the time of surgery this patient's most appropriate activity status/level should be described as: []     0    Normal activity, no symptoms [x]     1    Restricted in physical strenuous activity but ambulatory, able to do out light work []     2    Ambulatory and capable of self care, unable to do work activities, up and about                 more than 50%  Of the time                            []     3    Only limited self care, in bed greater than 50% of waking hours []     4    Completely disabled, no self care, confined to bed or chair []     5    Moribund  Past Medical History:  Diagnosis Date  . Chronic back pain 1989   broke back  . Hyperlipidemia   . Hypertension   . Opiate addiction ([place])    remote  .  Stroke [facility])     Past Surgical History:  Procedure Laterality Date  . APPENDECTOMY    . BACK SURGERY     lumbar spine, cut sciatic nerve  . CHOLECYSTECTOMY      Social History   Tobacco Use  Smoking Status Former Smoker  . Packs/day: 1.50  . Years: 33.00  . Pack years: 49.50  . Last attempt to quit: 1999  . Years since quitting: 20.7  Smokeless Tobacco Never Used    Social History   Substance and Sexual Activity  Alcohol Use Yes     No Known Allergies  Current Facility-Administered Medications  Medication Dose Route Frequency Provider Last Rate Last Dose  . acetaminophen (TYLENOL) tablet 650 mg  650 mg Oral Q6H PRN Orson Eva J, DO       Or  . acetaminophen (TYLENOL) suppository 650 mg  650 mg Rectal Q6H PRN Bufford Lope, DO      . Derrill Memo  ON 08/23/2018] Influenza vac split quadrivalent PF (FLUARIX) injection 0.5 mL  0.5 mL Intramuscular Tomorrow-1000 Martyn Malay, MD      . Derrill Memo ON 08/23/2018] pneumococcal 23 valent vaccine (PNU-IMMUNE) injection 0.5 mL  0.5 mL Intramuscular Tomorrow-1000 Dorris Singh M, MD      . polyethylene glycol (MIRALAX / GLYCOLAX) packet 17 g  17 g Oral Daily PRN Bufford Lope, DO        Medications Prior to Admission  Medication Sig Dispense Refill Last Dose  . Krill Oil 500 MG CAPS Take 1 capsule by mouth daily.   04/18/2013 at Unknown  . Probiotic Product (PROBIOTIC DAILY PO) Take 1 capsule by mouth daily.   04/15/13    Family History  Problem Relation Age of Onset  . Breast cancer Mother        multiple types  . Lung cancer Maternal Aunt        nonsmoking  . Pancreatic cancer Maternal Aunt      Review of Systems:   Pertinent items are noted in HPI.     Cardiac Review of Systems: Y or  [    ]= no  Chest Pain [  y  ]  Resting SOB Blue.Reese   ] Exertional SOB  Blue.Reese  ]  Orthopnea Blue.Reese  ]   Pedal Edema [ n  ]    Palpitations Florencio.Farrier  ] Syncope  Florencio.Farrier  ]   Presyncope [ n  ]  General Review of Systems: [Y] = yes [  ]=no Constitional: recent weight  change Blue.Reese  ]; anorexia [  ]; fatigue [ y ]; nausea [  Daily In  am ]; night sweats [  ]; fever [  ]; or chills [  ]                                                               Dental: Last Dentist visit: unknown   Eye : blurred vision [  ]; diplopia [   ]; vision changes [  ];  Amaurosis fugax[  ]; Resp: cough [  ];  wheezing[ n ];  hemoptysis[n  ]; shortness of breath[y  ]; paroxysmal nocturnal dyspnea[y  ]; dyspnea on exertion[  y]; or orthopnea[ y ];  GI:  gallstones[  ], vomiting[  ];  dysphagia[  ]; melena[  ];  hematochezia [  ]; heartburn[  ];   Hx of  Colonoscopy[  ]; GU: kidney stones [  ]; hematuria[  ];   dysuria [  ];  nocturia[  ];  history of     obstruction [  ]; urinary frequency [  ]             Skin: rash, swelling[  ];, hair loss[  ];  peripheral edema[  ];  or itching[  ]; Musculosketetal: myalgias[  ];  joint swelling[  ];  joint erythema[  ];  joint pain[  ];  back pain[ y ];  Heme/Lymph: bruising[  ];  bleeding[  ];  anemia[  ];  Neuro: TIA[  ];  headaches[  ];  stroke[  ];  vertigo[  ];  seizures[  ];   paresthesias[  ];  difficulty walking[  ];  Psych:depression[  ]; anxiety[  ];  Endocrine:  diabetes[  ];  thyroid dysfunction[  ];               Physical Exam: BP (!) 134/91 (BP Location: Right Arm)   Pulse (!) 113   Temp 97.7 F (36.5 C) (Oral)   Resp 19   Ht 6\' 1"  (1.854 m)   Wt 83.2 kg   SpO2 100%   BMI 24.21 kg/m    General appearance: alert, cooperative, appears older than stated age and no distress Head: Normocephalic, without obvious abnormality, atraumatic Neck: no carotid bruit, no JVD, supple, symmetrical, trachea midline, thyroid not enlarged, symmetric, no tenderness/mass/nodules and Patient has slight fullness in the right supraclavicular fossa on palpation this may be approachable for biopsy Lymph nodes: Cervical, supraclavicular, and axillary nodes normal. and Patient has no axillary or inguinal adenopathy Resp: diminished breath sounds  bibasilar Back: symmetric, no curvature. ROM normal. No CVA tenderness. Cardio: regular rate and rhythm, S1, S2 normal, no murmur, click, rub or gallop GI: soft, non-tender; bowel sounds normal; no masses,  no organomegaly Extremities: extremities normal with exception of congenitally malformed left hand and wrist, atraumatic, no cyanosis or edema, Homans sign is negative, no sign of DVT and varicose veins noted Neurologic: Grossly normal  Diagnostic Studies & Laboratory data:     Recent Radiology Findings:   Dg Chest 2 View  Result Date: 08/22/2018 CLINICAL DATA:  Acute shortness of breath. Fatigue and nausea/vomiting. EXAM: CHEST - 2 VIEW COMPARISON:  08/06/2006 FINDINGS: RIGHT mediastinal and RIGHT hilar masses are now identified. A 1.5 cm nodular opacity overlying the UPPER LEFT lung noted. The visualized lungs are otherwise clear. No pleural effusion or pneumothorax. No acute bony abnormalities are identified. IMPRESSION: New RIGHT mediastinal and RIGHT hilar masses and possible LEFT lung nodule. Chest CT with contrast recommended for further evaluation. Electronically Signed   By: Margarette Canada M.D.   On: 08/22/2018 12:31   Ct Angio Chest Pe W And/or Wo Contrast  Result Date: 08/22/2018 CLINICAL DATA:  Patient began feeling ill 5 days ago with nausea, vomiting and fatigue. Shortness of breath today. Masses noted on the current chest radiograph. EXAM: CT ANGIOGRAPHY CHEST WITH CONTRAST TECHNIQUE: Multidetector CT imaging of the chest was performed using the standard protocol during bolus administration of intravenous contrast. Multiplanar CT image reconstructions and MIPs were obtained to evaluate the vascular anatomy. CONTRAST:  145mL ISOVUE-370 IOPAMIDOL (ISOVUE-370) INJECTION 76% COMPARISON:  Current chest radiograph. FINDINGS: Cardiovascular: There is satisfactory opacification of the pulmonary arteries to the segmental level. There is no evidence of a pulmonary embolism. Large right upper  lobe and confluent mediastinal and hilar mass occludes the right upper lobe pulmonary artery. It narrows the right lower lobe pulmonary artery. The mass also invades the superior vena cava. It abuts and slightly deforms the innominate artery. Mass partly insert goals the ascending aorta and proximal aortic arch. Heart is normal in size and configuration. Trace amount pericardial fluid. Mild three-vessel coronary artery calcifications. Thoracic aorta is normal in caliber. No dissection. Mild thoracic aortic atherosclerosis. Mediastinum/Nodes: Large right upper lobe mass is contiguous with a large mediastinal mass extends from the right superior mediastinum, follow-up in the anterior, middle and posterior mediastinum, extending inferiorly to just above the caval atrial junction. Mass narrows and invades the superior vena cava although the vessel is not excluded. Mass partly encases the innominate artery and right subclavian artery and abuts the right common carotid artery. Mass partly encases the ascending thoracic aorta proximal arch. Mass also abuts the  trachea displacing it to the left. Trachea is narrowed, greatest narrowing in the anterior to posterior dimension. It measures 1.5 x 0.5 mm in transverse dimensions at the level of the top of the aortic arch. There are shotty prominent mildly enlarged mediastinal lymph nodes the prevascular space. Extends to surround the right hilar structures as described under the cardiovascular section. Mass is contiguous with an enlarged subcarinal lymph node measures 3.2 cm in short axis. There are prominent neck base lymph nodes. Largest lies at the right neck base measuring 2.9 cm short axis. Lungs/Pleura: Trace bilateral pleural effusions. Large medial right upper lobe mass contiguous with the mediastinal mass and right hilar mass. The overall size of this mass in mediastinal and right hilar components measures approximately 14 x 10 x 13 cm. There are lung nodules. Largest  lies in the left upper lobe centered on image 77, series 9, measuring 15 x 13 mm. Nodule in the left lower lobe, image 109, measuring 6 mm. Nodule in the right upper lobe, image 100, measuring 6 mm. Elevation the right hemidiaphragm. Mild atelectasis lies in the right lower lobe above the elevated hemidiaphragm. There is mild atelectasis adjacent to the large right upper lobe mass. Mild centrilobular emphysema. No convincing pneumonia. No pulmonary edema. No pneumothorax. Upper Abdomen: No acute findings. No liver or adrenal masses noted on the included field of view. Musculoskeletal: No fracture or acute finding. No osteoblastic or osteolytic lesions. Review of the MIP images confirms the above findings. IMPRESSION: 1. No evidence of a pulmonary embolism. 2. Large mass consistent with malignancy is centered on the right upper mediastinum and medial right upper lobe. It extends to surround the right hilar structures. The mass invades and narrows the superior vena cava. It occludes the right upper lobe pulmonary artery and narrows right lower lobe pulmonary artery. It abuts the aorta and innominate, right subclavian and right common carotid arteries. It also abuts and narrows esophagus. 3. Or is additional metastatic disease with metastatic adenopathy at the right neck base, small metastatic lung nodules and additional shotty mediastinal nodes that are presumed to be metastatic disease. 4. Trace pleural effusions. No convincing pulmonary edema. No pneumonia. Mild right lower lobe atelectasis. Aortic Atherosclerosis (ICD10-I70.0) and Emphysema (ICD10-J43.9). Electronically Signed   By: Lajean Manes M.D.   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  On  exam and on CT scan there is a slight fullness in the right supraclavicular fossa which may be able to be biopsied, if were unable to get adequate tissue from this we will plan a right parasternal exploration and biopsy of the mass directly.  Risks of the procedure particularly the increased risk because of superior vena cava syndrome and compression of airways as discussed with the patient and his family.  We will plan to keep the patient n.p.o. after midnight and proceed with biopsy tomorrow.  Patient will need radiation oncology consultation Medical oncology consultation MRI of the brain CT of the abdomen pelvis-and ultimately a PET scan as an outpatient   Grace Isaac MD      [address] [place] [address] Office [phone]   Beeper [phone]  08/22/2018 6:49 PM

## 2018-08-22 NOTE — ED Triage Notes (Signed)
Pt reports that he started feeling bad on Tuesday with n/v, fatigue and now sob today. ekg done and airway intact at triage.

## 2018-08-22 NOTE — Anesthesia Preprocedure Evaluation (Addendum)
Anesthesia Evaluation  Patient identified by MRN, date of birth, ID band Patient awake    Reviewed: Allergy & Precautions, H&P , NPO status , Patient's Chart, lab work & pertinent test results  Airway Mallampati: II  TM Distance: >3 FB Neck ROM: Full    Dental no notable dental hx. (+) Edentulous Upper, Dental Advisory Given   Pulmonary former smoker,  Large mediastinal/RUL mass   + rhonchi        Cardiovascular hypertension,  Rhythm:Regular Rate:Tachycardia     Neuro/Psych negative neurological ROS  negative psych ROS   GI/Hepatic negative GI ROS, Neg liver ROS,   Endo/Other  negative endocrine ROS  Renal/GU negative Renal ROS  negative genitourinary   Musculoskeletal   Abdominal   Peds  Hematology negative hematology ROS (+)   Anesthesia Other Findings   Reproductive/Obstetrics negative OB ROS                           Anesthesia Physical Anesthesia Plan  ASA: IV  Anesthesia Plan: General   Post-op Pain Management:    Induction: Intravenous  PONV Risk Score and Plan: 2 and Propofol infusion and Ondansetron  Airway Management Planned: Oral ETT  Additional Equipment:   Intra-op Plan:   Post-operative Plan: Extubation in OR and Possible Post-op intubation/ventilation  Informed Consent: I have reviewed the patients History and Physical, chart, labs and discussed the procedure including the risks, benefits and alternatives for the proposed anesthesia with the patient or authorized representative who has indicated his/her understanding and acceptance.   Dental advisory given  Plan Discussed with: CRNA  Anesthesia Plan Comments:        Anesthesia Quick Evaluation

## 2018-08-22 NOTE — Progress Notes (Signed)
Patient has been tachycardic and is stable.

## 2018-08-22 NOTE — H&P (Addendum)
[facility] Admission History and Physical Service Pager: [phone]  Patient name: Gerald Adkins Medical record number: [id number] Date of birth: 24-Jan-1956 Age: 62 y.o. Gender: male  Primary Care Provider: [name], MD Consultants: CVTS, Code Status: full code  Chief Complaint: facial and neck swelling with dyspnea  Assessment and Plan: LUE DUBUQUE is a 62 y.o. male presenting with facial swelling and dyspnea . 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 He was found to have a large mass consistent with malignancy centered on R upper mediastinum and upper lobe invading and narrowing the SVC on CTA chest. CT does show trachea displacement and esophageal narrowing but patient is able to protect his airway and has no dysphagia but does note some increased difficulty breathing with certain positional changes such as leaning forward or on his side. Reassuringly no focal neurological deficits or confusion, so no need for emergent intervention or SVC stent. Patient will need radiation oncology and PET scan follow up as he would like aggressive measures. - admit to Telemetry, attending Dr. Owens Shark - CVTS consulted for biopsy of mass, holding SQ heparin. Appreciate recommendations - am CBC, CMP - monitor for mental status changes.  If there is an acute worsening of status, would need SVC stent by IR  - consider palliative consult   #chronic back pain - patient fractured his vertebrae approximately 30 years ago. Currently self medicates with marijuana.  Does not want opioids during admission if possible.   - prn tylenol for pain  #marijuana use disorder - patient has admitted to being addicted to  opioids on 3 different occasions.  He states he now uses marijuana for pain control in order to stay off of opioids.  Would prefer to avoid opioids during this hospital stay.   - counseled on cessation  #chronic vomiting - patient states his whole life he has had issues with vomiting when waking up before 9am.  Has had to wake up early for jury duty this past week. He states he vomited for 45 minutes one day earlier this week.  - prn zofran for nausea. (Qtc 449)  FEN/GI: heart healthy diet, replete as necessary, miralax Prophylaxis: SCDs  Disposition: telemetry   History of Present Illness:  Gerald Adkins is a 62 y.o. male presenting with neck and facial swelling with dyspnea. PMH is significant for stroke, vertebral fracture, HTN.    Patient states that he has had a cough for the last 1 year.  However he feels that he started having some slow insidious progressive dyspnea on exertion over the last 1 month.  Facial and neck swelling were first noticed this morning.  He states he was having trouble breathing when lying in certain positions, and that it feels like his airway has 'closed off' at certain positions.  He usually lies on his left side because he for the past year he has not been able to sleep on his back due to productive cough and nocturnal dyspnea when lying in the supine position. He does not have difficulty swallowing his saliva or eating food.  He states he hasn't smoked cigarettes in about twenty years, but had at least a 90 pack/year history before that.   He complains  of an unintentional weight loss in the past month along with intermittent loss of appetite.  The loss of appetite may have more to do with him having to wake up earlier than 9am this past week for jury duty, which causes him to vomit several times. This is a lifelong issue for him. He has not eaten anything today but that is because he thinks he shouldn't eat before coming to the hospital.      Review Of Systems:    Review of Systems  Constitutional: Positive for malaise/fatigue and weight loss.  Respiratory: Positive for cough. Negative for hemoptysis.   Cardiovascular: Negative for chest pain.  Gastrointestinal: Positive for nausea and vomiting. Negative for abdominal pain and diarrhea.  Genitourinary: Negative for dysuria, frequency, hematuria and urgency.  Musculoskeletal: Positive for back pain.  Neurological: Negative for headaches.  All other systems reviewed and are negative.   Patient Active Problem List   Diagnosis Date Noted  . 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    Past Medical History: Past Medical History:  Diagnosis Date  . Chronic back pain 1989   broke back  . Hyperlipidemia   . Hypertension   . Opiate addiction ([place])    remote  . Stroke [facility])     Past Surgical History: Past Surgical History:  Procedure Laterality Date  . APPENDECTOMY    . BACK SURGERY     lumbar spine, cut sciatic nerve  . CHOLECYSTECTOMY      Social History: Social History   Tobacco Use  . Smoking status: Former Smoker    Packs/day: 1.50    Years: 33.00    Pack years: 49.50    Last attempt to quit: 1999    Years since quitting: 20.7  . Smokeless tobacco: Never Used  Substance Use Topics  . Alcohol use: Yes  . Drug use: Yes    Frequency: 3.0 times per week    Types: Marijuana   Additional social history: Lives at home with wife. No other recreational drug use apart of marijuana. Has not had alcohol in 1 month, used to be a weekend drinker.  Please also refer to relevant sections of EMR.  Family History: Family History  Problem Relation Age of Onset  . Breast cancer Mother        multiple types  . Lung cancer Maternal Aunt        nonsmoking  . Pancreatic cancer Maternal Aunt     Allergies and Medications: No Known Allergies No current facility-administered medications on file prior to encounter.    Current Outpatient Medications on File Prior to Encounter  Medication Sig Dispense  Refill  . Krill Oil 500 MG CAPS Take 1 capsule by mouth daily.    . Probiotic Product (PROBIOTIC DAILY PO) Take 1 capsule by mouth daily.    States not on these supplements.  Objective: BP (!) 134/91   Pulse (!) 113   Temp 97.7 F (36.5 C) (Oral)   Resp 19   SpO2 100%  Exam: General: alert and oriented.  No acute distress.  HEENT: atraumatic edematous head with prominent nasal veins. no asymmetry in the oropharynx.  No erythema or swelling in the oropharynx.  Eyes: no scleral icterus.  PERRLA, EOMI.   Neck: significant generalized neck swelling with distended neck veins.  No appreciable cervical lymphadenopathy.   Cardiovascular: regular rate and rhythm.  No murmurs. 2+ radial pulse bilaterally.  No LE edema.  Respiratory: lungs clear to auscultation bilaterally with egophony on R Gastrointestinal: active bowel  sounds.  nontender to palpation in all 4 quadrants.  MSK: 5/5 strength bilaterally upper and lower extremities.  Derm: no rashes.  Neuro: CN 2-12 grossly intact.  Sensation to light touch intact bilaterally upper and lower extremities.  Endorses decreased sensation on the lower right side of face as compared to the left.  Psych: Pleasant affect. Good insight.  Labs and Imaging: CBC BMET  Recent Labs  Lab 08/22/18 1221  WBC 11.1*  HGB 8.3*  HCT 30.8*  PLT 535*   Recent Labs  Lab 08/22/18 1221  NA 134*  K 4.1  CL 99  CO2 24  BUN 8  CREATININE 0.62  GLUCOSE 100*  CALCIUM 8.6*      Troponin (Point of Care Test) Recent Labs    08/22/18 1232  TROPIPOC 0.00    BNP    Component Value Date/Time   BNP 47.0 08/22/2018 1221    Dg Chest 2 View  Result Date: 08/22/2018 CLINICAL DATA:  Acute shortness of breath. Fatigue and nausea/vomiting. EXAM: CHEST - 2 VIEW COMPARISON:  08/06/2006 FINDINGS: RIGHT mediastinal and RIGHT hilar masses are now identified. A 1.5 cm nodular opacity overlying the UPPER LEFT lung noted. The visualized lungs are otherwise clear. No  pleural effusion or pneumothorax. No acute bony abnormalities are identified. IMPRESSION: New RIGHT mediastinal and RIGHT hilar masses and possible LEFT lung nodule. Chest CT with contrast recommended for further evaluation. Electronically Signed   By: Margarette Canada M.D.   On: 08/22/2018 12:31   Ct Angio Chest Pe W And/or Wo Contrast  Result Date: 08/22/2018 CLINICAL DATA:  Patient began feeling ill 5 days ago with nausea, vomiting and fatigue. Shortness of breath today. Masses noted on the current chest radiograph. EXAM: CT ANGIOGRAPHY CHEST WITH CONTRAST TECHNIQUE: Multidetector CT imaging of the chest was performed using the standard protocol during bolus administration of intravenous contrast. Multiplanar CT image reconstructions and MIPs were obtained to evaluate the vascular anatomy. CONTRAST:  155mL ISOVUE-370 IOPAMIDOL (ISOVUE-370) INJECTION 76% COMPARISON:  Current chest radiograph. FINDINGS: Cardiovascular: There is satisfactory opacification of the pulmonary arteries to the segmental level. There is no evidence of a pulmonary embolism. Large right upper lobe and confluent mediastinal and hilar mass occludes the right upper lobe pulmonary artery. It narrows the right lower lobe pulmonary artery. The mass also invades the superior vena cava. It abuts and slightly deforms the innominate artery. Mass partly insert goals the ascending aorta and proximal aortic arch. Heart is normal in size and configuration. Trace amount pericardial fluid. Mild three-vessel coronary artery calcifications. Thoracic aorta is normal in caliber. No dissection. Mild thoracic aortic atherosclerosis. Mediastinum/Nodes: Large right upper lobe mass is contiguous with a large mediastinal mass extends from the right superior mediastinum, follow-up in the anterior, middle and posterior mediastinum, extending inferiorly to just above the caval atrial junction. Mass narrows and invades the superior vena cava although the vessel is not  excluded. Mass partly encases the innominate artery and right subclavian artery and abuts the right common carotid artery. Mass partly encases the ascending thoracic aorta proximal arch. Mass also abuts the trachea displacing it to the left. Trachea is narrowed, greatest narrowing in the anterior to posterior dimension. It measures 1.5 x 0.5 mm in transverse dimensions at the level of the top of the aortic arch. There are shotty prominent mildly enlarged mediastinal lymph nodes the prevascular space. Extends to surround the right hilar structures as described under the cardiovascular section. Mass is contiguous with an enlarged subcarinal lymph  node measures 3.2 cm in short axis. There are prominent neck base lymph nodes. Largest lies at the right neck base measuring 2.9 cm short axis. Lungs/Pleura: Trace bilateral pleural effusions. Large medial right upper lobe mass contiguous with the mediastinal mass and right hilar mass. The overall size of this mass in mediastinal and right hilar components measures approximately 14 x 10 x 13 cm. There are lung nodules. Largest lies in the left upper lobe centered on image 77, series 9, measuring 15 x 13 mm. Nodule in the left lower lobe, image 109, measuring 6 mm. Nodule in the right upper lobe, image 100, measuring 6 mm. Elevation the right hemidiaphragm. Mild atelectasis lies in the right lower lobe above the elevated hemidiaphragm. There is mild atelectasis adjacent to the large right upper lobe mass. Mild centrilobular emphysema. No convincing pneumonia. No pulmonary edema. No pneumothorax. Upper Abdomen: No acute findings. No liver or adrenal masses noted on the included field of view. Musculoskeletal: No fracture or acute finding. No osteoblastic or osteolytic lesions. Review of the MIP images confirms the above findings. IMPRESSION: 1. No evidence of a pulmonary embolism. 2. Large mass consistent with malignancy is centered on the right upper mediastinum and medial  right upper lobe. It extends to surround the right hilar structures. The mass invades and narrows the superior vena cava. It occludes the right upper lobe pulmonary artery and narrows right lower lobe pulmonary artery. It abuts the aorta and innominate, right subclavian and right common carotid arteries. It also abuts and narrows esophagus. 3. Or is additional metastatic disease with metastatic adenopathy at the right neck base, small metastatic lung nodules and additional shotty mediastinal nodes that are presumed to be metastatic disease. 4. Trace pleural effusions. No convincing pulmonary edema. No pneumonia. Mild right lower lobe atelectasis. Aortic Atherosclerosis (ICD10-I70.0) and Emphysema (ICD10-J43.9). Electronically Signed   By: Lajean Manes M.D.   On: 08/22/2018 14:32    Benay Pike, MD 08/22/2018, 5:19 PM PGY-1, [facility] pager: [phone], text pages welcome  FPTS Upper-Level Resident Addendum  I have independently interviewed and examined the patient. I have discussed the above with the original author and agree with their documentation. My edits for correction/addition/clarification are in blue. Please see also any attending notes.   Bufford Lope, DO PGY-3, [facility] 08/22/2018 5:53 PM  FPTS Service pager: [phone] (text pages welcome through [place])

## 2018-08-22 NOTE — Progress Notes (Signed)
New pt admission from ED. Pt brought to the floor in stable condition. Vitals taken. Initial Assessment done. All immediate pertinent needs to patient addressed. Patient Guide given to patient. Important safety instructions relating to hospitalization reviewed with patient. Patient verbalized understanding. Will continue to monitor pt.  Palma Holter RN

## 2018-08-22 NOTE — ED Notes (Signed)
Patient transported to X-ray 

## 2018-08-22 NOTE — ED Provider Notes (Signed)
[facility] Emergency Department Provider Note MRN:  [id number]  Arrival date & time: 08/22/18     Chief Complaint   Shortness of Breath   History of Present Illness   Gerald Adkins is a 62 y.o. year-old male with a history of stroke presenting to the ED with chief complaint of shortness of breath.  Gradual onset shortness of breath 45 hours prior to arrival.  Shortness of breath located in the chest, worse with laying flat.  Associated with recent periorbital swelling, which he says usually only occurs if he is drinking too much alcohol.  Also associated with right-sided stabbing neck pain, constant, nonradiating.  Denies headache or vision change, no chest pain, no abdominal pain, no pain or swelling of the legs.  No recent fever, but does endorse recent dry cough.  Review of Systems  A complete 10 system review of systems was obtained and all systems are negative except as noted in the HPI and PMH.   Patient's Health History    Past Medical History:  Diagnosis Date  . Chronic back pain 1989   broke back  . Hyperlipidemia   . Hypertension   . 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 Stroke [facility])     Past Surgical History:  Procedure Laterality Date  . APPENDECTOMY    . BACK SURGERY     lumbar spine, cut sciatic nerve  . CHOLECYSTECTOMY      Family History  Problem Relation Age of Onset  . Breast cancer Mother        multiple types  . Lung cancer Maternal Aunt        nonsmoking  . Pancreatic cancer Maternal Aunt     Social History   Socioeconomic History  . Marital status: Married    Spouse name: Not on file  . Number of children: Not on file  . Years of education: Not on file  . Highest education level: Not on file  Occupational History  . Not on file  Social Needs  . Financial resource strain: Not on file  . Food insecurity:    Worry: Not on file    Inability: Not on file  . Transportation needs:    Medical: Not on file    Non-medical:  Not on file  Tobacco Use  . Smoking status: Former Smoker    Packs/day: 1.50    Years: 33.00    Pack years: 49.50    Last attempt to quit: 1999    Years since quitting: 20.7  . Smokeless tobacco: Never Used  Substance and Sexual Activity  . Alcohol use: Yes  . Drug use: Yes    Frequency: 3.0 times per week    Types: Marijuana  . Sexual activity: Not on file  Lifestyle  . Physical activity:    Days per week: Not on file    Minutes per session: Not on file  . Stress: Not on file  Relationships  . Social connections:    Talks on phone: Not on file    Gets together: Not on file    Attends religious service: Not on file    Active member of club or organization: Not on file    Attends meetings of clubs or organizations: Not on file    Relationship status: Not on file  . Intimate partner violence:    Fear of current or ex partner: Not on file    Emotionally abused: Not on file    Physically  abused: Not on file    Forced sexual activity: Not on file  Other Topics Concern  . Not on file  Social History Narrative  . Not on file     Physical Exam  Vital Signs and Nursing Notes reviewed Vitals:   08/22/18 1644 08/22/18 1942  BP: (!) 134/91 (!) 131/99  Pulse: (!) 113 (!) 112  Resp: 19 18  Temp: 97.7 F (36.5 C) 97.9 F (36.6 C)  SpO2: 100% 97%    CONSTITUTIONAL: Chronically ill-appearing, NAD NEURO:  Alert and oriented x 3, no focal deficits EYES:  eyes equal and reactive, swelling of the upper eyelids ENT/NECK:  no LAD, no JVD CARDIO: Regular rate, well-perfused, normal S1 and S2 PULM:  CTAB no wheezing or rhonchi GI/GU:  normal bowel sounds, non-distended, non-tender MSK/SPINE:  No gross deformities, no edema SKIN:  no rash, atraumatic PSYCH:  Appropriate speech and behavior  Diagnostic and Interventional Summary    EKG Interpretation  Date/Time:  Sunday August 22 2018 11:41:45 EDT Ventricular Rate:  109 PR Interval:  136 QRS Duration: 84 QT  Interval:  334 QTC Calculation: 449 R Axis:   99 Text Interpretation:  Sinus tachycardia Rightward axis Anterior infarct , age undetermined Abnormal ECG Confirmed by Gerlene Fee [id number]) on 08/22/2018 12:47:06 PM      Labs Reviewed  BASIC METABOLIC PANEL - Abnormal; Notable for the following components:      Result Value   Sodium 134 (*)    Glucose, Bld 100 (*)    Calcium 8.6 (*)    All other components within normal limits  CBC - Abnormal; Notable for the following components:   WBC 11.1 (*)    Hemoglobin 8.3 (*)    HCT 30.8 (*)    MCV 70.2 (*)    MCH 18.9 (*)    MCHC 26.9 (*)    RDW 17.3 (*)    Platelets 535 (*)    All other components within normal limits  SURGICAL PCR SCREEN  BRAIN NATRIURETIC PEPTIDE  PROTIME-INR  APTT  HIV ANTIBODY (ROUTINE TESTING W REFLEX)  COMPREHENSIVE METABOLIC PANEL  CBC WITH DIFFERENTIAL/PLATELET  VITAMIN B12  FOLATE  IRON AND TIBC  FERRITIN  RETICULOCYTES  I-STAT TROPONIN, ED  TYPE AND SCREEN  ABO/RH    CT Angio Chest PE W and/or Wo Contrast  Final Result    DG Chest 2 View  Final Result      Medications  acetaminophen (TYLENOL) tablet 650 mg (has no administration in time range)    Or  acetaminophen (TYLENOL) suppository 650 mg (has no administration in time range)  polyethylene glycol (MIRALAX / GLYCOLAX) packet 17 g (has no administration in time range)  Influenza vac split quadrivalent PF (FLUARIX) injection 0.5 mL (has no administration in time range)  pneumococcal 23 valent vaccine (PNU-IMMUNE) injection 0.5 mL (has no administration in time range)  ceFAZolin (ANCEF) IVPB 2g/100 mL premix (has no administration in time range)  Chlorhexidine Gluconate Cloth 2 % PADS 6 each (has no administration in time range)    And  Chlorhexidine Gluconate Cloth 2 % PADS 6 each (has no administration in time range)  iopamidol (ISOVUE-370) 76 % injection (100 mLs  Contrast Given 08/22/18 1339)     Procedures Critical Care  ED Course  and Medical Decision Making  I have reviewed the triage vital signs and the nursing notes.  Pertinent labs & imaging results that were available during my care of the patient were reviewed by me and considered  in my medical decision making (see below for details).  Considering pneumonia versus new onset CHF and this 62 year old male here with shortness of breath.  No evidence of DVT on exam, considering PE but less of a concern today.  Work-up pending  Clinical Course as of Aug 22 2199  Sun Aug 22, 2018  1254 Work-up revealing decreased hemoglobin, patient explains he has chronic rectal bleeding related to hemorrhoids.  Chest x-ray concerning for lung mass, will follow-up with CT of the chest to evaluate this mass and also to exclude the possibility of PE given the concern for underlying malignancy.  Hemoglobin(!): 8.3 [MB]  1255 Patient reporting 50 to 60 pound unintentional weight loss over the course of the past 3 to 4 weeks.   [MB]    Clinical Course User Index [MB] Maudie Flakes, MD    CT reveals metastatic disease versus primary lung cancer causing impingement or narrowing of the SVC, esophagus.  Admitted to medicine service for further care and evaluation.  Barth Kirks. Sedonia Small, [place] [email or web address]  Final Clinical Impressions(s) / ED Diagnoses     ICD-10-CM   1. Lung tumor D49.1   2. SOB (shortness of breath) R06.02     ED Discharge Orders    None         Maudie Flakes, MD 08/22/18 2200

## 2018-08-22 NOTE — Plan of Care (Signed)
  Problem: Clinical Measurements: Goal: Will remain free from infection Outcome: Progressing   Problem: Activity: Goal: Risk for activity intolerance will decrease Outcome: Progressing   Problem: Nutrition: Goal: Adequate nutrition will be maintained Outcome: Progressing   Problem: Coping: Goal: Level of anxiety will decrease Outcome: Progressing   Problem: Elimination: Goal: Will not experience complications related to bowel motility Outcome: Progressing Goal: Will not experience complications related to urinary retention Outcome: Progressing   Problem: Pain Managment: Goal: General experience of comfort will improve Outcome: Progressing   Problem: Safety: Goal: Ability to remain free from injury will improve Outcome: Progressing   Problem: Skin Integrity: Goal: Risk for impaired skin integrity will decrease Outcome: Progressing

## 2018-08-23 ENCOUNTER — Inpatient Hospital Stay (HOSPITAL_COMMUNITY): Payer: Medicare Other | Admitting: Anesthesiology

## 2018-08-23 ENCOUNTER — Ambulatory Visit
Admit: 2018-08-23 | Discharge: 2018-08-23 | Disposition: A | Discharge status: Home / Self Care | Payer: Medicare Other | Source: Ambulatory Visit | Attending: Radiation Oncology | Admitting: Radiation Oncology

## 2018-08-23 ENCOUNTER — Encounter (HOSPITAL_COMMUNITY)
Admission: EM | Disposition: A | Discharge status: Home / Self Care | Payer: Self-pay | Source: Home / Self Care | Attending: Internal Medicine

## 2018-08-23 DIAGNOSIS — Z87891 Personal history of nicotine dependence: Secondary | ICD-10-CM

## 2018-08-23 DIAGNOSIS — I871 Compression of vein: Secondary | ICD-10-CM

## 2018-08-23 DIAGNOSIS — I10 Essential (primary) hypertension: Secondary | ICD-10-CM | POA: Diagnosis not present

## 2018-08-23 HISTORY — PX: SCALENE NODE BIOPSY: SHX5446

## 2018-08-23 LAB — RETICULOCYTES
RBC.: 4.44 MIL/uL (ref 4.22–5.81)
RETIC COUNT ABSOLUTE: 79.9 10*3/uL (ref 19.0–186.0)
Retic Ct Pct: 1.8 % (ref 0.4–3.1)

## 2018-08-23 LAB — CBC WITH DIFFERENTIAL/PLATELET
Basophils Absolute: 0.1 10*3/uL (ref 0.0–0.1)
Basophils Relative: 1 %
EOS PCT: 1 %
Eosinophils Absolute: 0.1 10*3/uL (ref 0.0–0.7)
HEMATOCRIT: 30.6 % — AB (ref 39.0–52.0)
HEMOGLOBIN: 8.5 g/dL — AB (ref 13.0–17.0)
LYMPHS PCT: 12 %
Lymphs Abs: 1.3 10*3/uL (ref 0.7–4.0)
MCH: 19.1 pg — ABNORMAL LOW (ref 26.0–34.0)
MCHC: 27.8 g/dL — ABNORMAL LOW (ref 30.0–36.0)
MCV: 68.9 fL — AB (ref 78.0–100.0)
MONOS PCT: 8 %
Monocytes Absolute: 0.9 10*3/uL (ref 0.1–1.0)
NEUTROS PCT: 78 %
Neutro Abs: 8.5 10*3/uL — ABNORMAL HIGH (ref 1.7–7.7)
PLATELETS: 528 10*3/uL — AB (ref 150–400)
RBC: 4.44 MIL/uL (ref 4.22–5.81)
RDW: 17.5 % — ABNORMAL HIGH (ref 11.5–15.5)
WBC: 10.9 10*3/uL — AB (ref 4.0–10.5)

## 2018-08-23 LAB — COMPREHENSIVE METABOLIC PANEL
ALBUMIN: 2.7 g/dL — AB (ref 3.5–5.0)
ALT: 12 U/L (ref 0–44)
AST: 19 U/L (ref 15–41)
Alkaline Phosphatase: 65 U/L (ref 38–126)
Anion gap: 14 (ref 5–15)
BUN: 8 mg/dL (ref 6–20)
CO2: 22 mmol/L (ref 22–32)
Calcium: 8.7 mg/dL — ABNORMAL LOW (ref 8.9–10.3)
Chloride: 99 mmol/L (ref 98–111)
Creatinine, Ser: 0.8 mg/dL (ref 0.61–1.24)
GFR calc Af Amer: >60 mL/min (ref >60)
GFR calc non Af Amer: >60 mL/min (ref >60)
Glucose, Bld: 130 mg/dL — ABNORMAL HIGH (ref 70–99)
POTASSIUM: 4.1 mmol/L (ref 3.5–5.1)
Sodium: 135 mmol/L (ref 135–145)
Total Bilirubin: 0.5 mg/dL (ref 0.3–1.2)
Total Protein: 6.5 g/dL (ref 6.5–8.1)

## 2018-08-23 LAB — IRON AND TIBC
Iron: 13 ug/dL — ABNORMAL LOW (ref 45–182)
SATURATION RATIOS: 4 % — AB (ref 17.9–39.5)
TIBC: 322 ug/dL (ref 250–450)
UIBC: 309 ug/dL

## 2018-08-23 LAB — VITAMIN B12: VITAMIN B 12: 562 pg/mL (ref 180–914)

## 2018-08-23 LAB — HIV ANTIBODY (ROUTINE TESTING W REFLEX): HIV SCREEN 4TH GENERATION: NONREACTIVE

## 2018-08-23 LAB — FOLATE: Folate: 11.8 ng/mL (ref >5.9)

## 2018-08-23 LAB — ABO/RH: ABO/RH(D): O POS

## 2018-08-23 LAB — FERRITIN: Ferritin: 22 ng/mL — ABNORMAL LOW (ref 24–336)

## 2018-08-23 SURGERY — BIOPSY, LYMPH NODE, SCALENE
Anesthesia: General | Laterality: Right

## 2018-08-23 MED ORDER — LABETALOL HCL 5 MG/ML IV SOLN
INTRAVENOUS | Status: AC
  Filled 2018-08-23: qty 4

## 2018-08-23 MED ORDER — LIDOCAINE 2% (20 MG/ML) 5 ML SYRINGE
INTRAMUSCULAR | Status: AC
  Filled 2018-08-23: qty 5

## 2018-08-23 MED ORDER — SUGAMMADEX SODIUM 200 MG/2ML IV SOLN
INTRAVENOUS | Status: DC | PRN
  Administered 2018-08-23: 330 mg via INTRAVENOUS

## 2018-08-23 MED ORDER — ROCURONIUM BROMIDE 10 MG/ML (PF) SYRINGE
PREFILLED_SYRINGE | INTRAVENOUS | Status: DC | PRN
  Administered 2018-08-23: 40 mg via INTRAVENOUS

## 2018-08-23 MED ORDER — LIDOCAINE HCL (PF) 1 % IJ SOLN
INTRAMUSCULAR | Status: AC
  Filled 2018-08-23: qty 30

## 2018-08-23 MED ORDER — SODIUM CHLORIDE 0.9 % IV SOLN
INTRAVENOUS | Status: DC | PRN
  Administered 2018-08-23: 20 ug/min via INTRAVENOUS

## 2018-08-23 MED ORDER — SUCCINYLCHOLINE CHLORIDE 200 MG/10ML IV SOSY
PREFILLED_SYRINGE | INTRAVENOUS | Status: AC
  Filled 2018-08-23: qty 10

## 2018-08-23 MED ORDER — PROPOFOL 10 MG/ML IV BOLUS
INTRAVENOUS | Status: DC | PRN
  Administered 2018-08-23: 130 mg via INTRAVENOUS

## 2018-08-23 MED ORDER — PROPOFOL 10 MG/ML IV BOLUS
INTRAVENOUS | Status: AC
  Filled 2018-08-23: qty 20

## 2018-08-23 MED ORDER — SUCCINYLCHOLINE CHLORIDE 200 MG/10ML IV SOSY
PREFILLED_SYRINGE | INTRAVENOUS | Status: DC | PRN
  Administered 2018-08-23: 100 mg via INTRAVENOUS

## 2018-08-23 MED ORDER — MIDAZOLAM HCL 2 MG/2ML IJ SOLN
INTRAMUSCULAR | Status: DC | PRN
  Administered 2018-08-23: 1 mg via INTRAVENOUS

## 2018-08-23 MED ORDER — FENTANYL CITRATE (PF) 100 MCG/2ML IJ SOLN
25.0000 ug | INTRAMUSCULAR | Status: DC | PRN
  Administered 2018-08-23: 50 ug via INTRAVENOUS

## 2018-08-23 MED ORDER — 0.9 % SODIUM CHLORIDE (POUR BTL) OPTIME
TOPICAL | Status: DC | PRN
  Administered 2018-08-23: 2000 mL

## 2018-08-23 MED ORDER — ROCURONIUM BROMIDE 50 MG/5ML IV SOSY
PREFILLED_SYRINGE | INTRAVENOUS | Status: AC
  Filled 2018-08-23: qty 5

## 2018-08-23 MED ORDER — PROPOFOL 1000 MG/100ML IV EMUL
INTRAVENOUS | Status: AC
  Filled 2018-08-23: qty 100

## 2018-08-23 MED ORDER — PHENOL 1.4 % MT LIQD
1.0000 | OROMUCOSAL | Status: DC | PRN
  Filled 2018-08-23 (×2): qty 177

## 2018-08-23 MED ORDER — ONDANSETRON HCL 4 MG/2ML IJ SOLN
INTRAMUSCULAR | Status: AC
  Filled 2018-08-23: qty 4

## 2018-08-23 MED ORDER — CEFAZOLIN SODIUM 1 G IJ SOLR
INTRAMUSCULAR | Status: AC
  Filled 2018-08-23: qty 20

## 2018-08-23 MED ORDER — LABETALOL HCL 5 MG/ML IV SOLN
INTRAVENOUS | Status: DC | PRN
  Administered 2018-08-23: 10 mg via INTRAVENOUS

## 2018-08-23 MED ORDER — FENTANYL CITRATE (PF) 250 MCG/5ML IJ SOLN
INTRAMUSCULAR | Status: AC
  Filled 2018-08-23: qty 5

## 2018-08-23 MED ORDER — HYDROMORPHONE HCL 2 MG PO TABS
2.0000 mg | ORAL_TABLET | Freq: Once | ORAL | Status: AC
  Administered 2018-08-23: 2 mg via ORAL
  Filled 2018-08-23: qty 1

## 2018-08-23 MED ORDER — LIDOCAINE 2% (20 MG/ML) 5 ML SYRINGE
INTRAMUSCULAR | Status: DC | PRN
  Administered 2018-08-23: 80 mg via INTRAVENOUS
  Administered 2018-08-23: 100 mg via INTRAVENOUS

## 2018-08-23 MED ORDER — HYDROCODONE-ACETAMINOPHEN 5-325 MG PO TABS
2.0000 | ORAL_TABLET | Freq: Once | ORAL | Status: AC
  Administered 2018-08-23: 2 via ORAL

## 2018-08-23 MED ORDER — FENTANYL CITRATE (PF) 100 MCG/2ML IJ SOLN
INTRAMUSCULAR | Status: AC
  Filled 2018-08-23: qty 2

## 2018-08-23 MED ORDER — HYDROCODONE-ACETAMINOPHEN 5-325 MG PO TABS
1.0000 | ORAL_TABLET | Freq: Four times a day (QID) | ORAL | Status: DC | PRN
  Administered 2018-08-23 – 2018-08-25 (×6): 2 via ORAL
  Filled 2018-08-23 (×7): qty 2

## 2018-08-23 MED ORDER — MIDAZOLAM HCL 2 MG/2ML IJ SOLN
INTRAMUSCULAR | Status: AC
  Filled 2018-08-23: qty 2

## 2018-08-23 MED ORDER — LACTATED RINGERS IV SOLN
INTRAVENOUS | Status: DC | PRN
  Administered 2018-08-23: 07:00:00 via INTRAVENOUS

## 2018-08-23 MED ORDER — ONDANSETRON HCL 4 MG/2ML IJ SOLN
INTRAMUSCULAR | Status: DC | PRN
  Administered 2018-08-23: 4 mg via INTRAVENOUS

## 2018-08-23 MED ORDER — HEMOSTATIC AGENTS (NO CHARGE) OPTIME
TOPICAL | Status: DC | PRN
  Administered 2018-08-23: 1 via TOPICAL

## 2018-08-23 MED ORDER — DEXAMETHASONE SODIUM PHOSPHATE 10 MG/ML IJ SOLN
INTRAMUSCULAR | Status: AC
  Filled 2018-08-23: qty 1

## 2018-08-23 MED ORDER — FENTANYL CITRATE (PF) 100 MCG/2ML IJ SOLN
INTRAMUSCULAR | Status: DC | PRN
  Administered 2018-08-23: 50 ug via INTRAVENOUS
  Administered 2018-08-23: 25 ug via INTRAVENOUS

## 2018-08-23 MED ORDER — ONDANSETRON HCL 4 MG/2ML IJ SOLN: 4.0000 mg | Freq: Four times a day (QID) | INTRAMUSCULAR | Status: DC

## 2018-08-23 MED ORDER — ONDANSETRON HCL 4 MG/2ML IJ SOLN
4.0000 mg | Freq: Four times a day (QID) | INTRAMUSCULAR | Status: DC | PRN
  Administered 2018-08-24 – 2018-08-25 (×4): 4 mg via INTRAVENOUS
  Filled 2018-08-23 (×5): qty 2

## 2018-08-23 SURGICAL SUPPLY — 40 items
ADH SKN CLS APL DERMABOND .7 (GAUZE/BANDAGES/DRESSINGS) ×1
CANISTER SUCT 3000ML PPV (MISCELLANEOUS) ×2 IMPLANT
CLIP VESOCCLUDE SM WIDE 24/CT (CLIP) ×1 IMPLANT
CONT SPEC 4OZ CLIKSEAL STRL BL (MISCELLANEOUS) ×3 IMPLANT
COVER SURGICAL LIGHT HANDLE (MISCELLANEOUS) ×3 IMPLANT
DERMABOND ADVANCED (GAUZE/BANDAGES/DRESSINGS) ×1
DERMABOND ADVANCED .7 DNX12 (GAUZE/BANDAGES/DRESSINGS) ×1 IMPLANT
DRAPE CHEST BREAST 15X10 FENES (DRAPES) ×1 IMPLANT
DRAPE LAPAROTOMY T 102X78X121 (DRAPES) ×1 IMPLANT
DRSG AQUACEL AG ADV 3.5X14 (GAUZE/BANDAGES/DRESSINGS) ×1 IMPLANT
ELECT BLADE 4.0 EZ CLEAN MEGAD (MISCELLANEOUS) ×2
ELECT CAUTERY BLADE 6.4 (BLADE) ×1 IMPLANT
ELECT REM PT RETURN 9FT ADLT (ELECTROSURGICAL) ×2
ELECTRODE BLDE 4.0 EZ CLN MEGD (MISCELLANEOUS) IMPLANT
ELECTRODE REM PT RTRN 9FT ADLT (ELECTROSURGICAL) ×1 IMPLANT
GAUZE SPONGE 4X4 12PLY STRL (GAUZE/BANDAGES/DRESSINGS) ×2 IMPLANT
GLOVE BIO SURGEON STRL SZ 6.5 (GLOVE) ×4 IMPLANT
GOWN STRL REUS W/ TWL LRG LVL3 (GOWN DISPOSABLE) ×1 IMPLANT
GOWN STRL REUS W/TWL LRG LVL3 (GOWN DISPOSABLE) ×6
HEMOSTAT SURGICEL 2X14 (HEMOSTASIS) ×1 IMPLANT
KIT BASIN OR (CUSTOM PROCEDURE TRAY) ×2 IMPLANT
KIT TURNOVER KIT B (KITS) ×2 IMPLANT
LIGACLIP MED TITANIUM (CLIP) ×1 IMPLANT
NEEDLE 22X1 1/2 (OR ONLY) (NEEDLE) IMPLANT
NS IRRIG 1000ML POUR BTL (IV SOLUTION) ×3 IMPLANT
PACK CHEST (CUSTOM PROCEDURE TRAY) ×1 IMPLANT
PACK GENERAL/GYN (CUSTOM PROCEDURE TRAY) ×1 IMPLANT
PAD ARMBOARD 7.5X6 YLW CONV (MISCELLANEOUS) ×4 IMPLANT
SPONGE INTESTINAL PEANUT (DISPOSABLE) ×1 IMPLANT
SUT SILK 2 0 TIES 10X30 (SUTURE) ×1 IMPLANT
SUT VIC AB 2-0 CT1 27 (SUTURE)
SUT VIC AB 2-0 CT1 TAPERPNT 27 (SUTURE) ×1 IMPLANT
SUT VIC AB 3-0 SH 27 (SUTURE)
SUT VIC AB 3-0 SH 27X BRD (SUTURE) ×1 IMPLANT
SUT VIC AB 3-0 SH 8-18 (SUTURE) ×1 IMPLANT
SUT VIC AB 3-0 X1 27 (SUTURE) ×2 IMPLANT
SYR CONTROL 10ML LL (SYRINGE) ×1 IMPLANT
TOWEL GREEN STERILE (TOWEL DISPOSABLE) ×2 IMPLANT
TOWEL GREEN STERILE FF (TOWEL DISPOSABLE) ×2 IMPLANT
WATER STERILE IRR 1000ML POUR (IV SOLUTION) ×2 IMPLANT

## 2018-08-23 NOTE — Progress Notes (Signed)
Family Medicine Teaching Service Daily Progress Note Intern Pager: [phone]  Patient name: Gerald Adkins Medical record number: [id number] Date of birth: [birth year] Age: 62 y.o. Gender: male  Primary Care Provider: [name], MD Consultants: CVTS Code Status: Full code  Pt Overview and Major Events to Date:  Hospital Day 1 Admitted: 08/22/2018  Assessment and Plan: Gerald Adkins is a 63 y.o. male presenting with facial swelling and dyspnea . 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  CVTS was consulted and they agreed to biopsy this AM.  - s/p biopsy CVTS wishes to transfer patient to [facility] for rad and med onc treatment.  - consider palliative consult for goals of care discussion at some point.   #anemia - Hgb 8.3 on initial labs. Has not been evaluated for anemia before. Is having dyspnea on exertion but this is thought to be due to mass.  - will need anemia work up at [facility]  #HTN - patient states he used to take medication for HTN many years ago but does not currently. Hypertensive in hospital. 131/99 - 163/78.   #chronic back pain - patient fractured his vertebrae approximately 30 years ago. Currently self medicates with marijuana.  Does not want opioids during admission if possible.   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  He states he now uses marijuana for pain control in order to stay off of opioids.  Would prefer to avoid opioids during this hospital stay.   - counseled on cessation  #chronic vomiting - patient states his whole life he has had issues with vomiting when waking up before 9am.  vomiting this  AM - prn zofran for nausea. (Qtc 449)  FEN/GI: heart healthy diet, replete as necessary, miralax Prophylaxis: SCDs  Disposition: telemetry    Medications: Scheduled Meds: . Influenza vac split quadrivalent PF  0.5 mL Intramuscular Tomorrow-1000  . pneumococcal 23 valent vaccine  0.5 mL Intramuscular Tomorrow-1000   Continuous Infusions: .  ceFAZolin (ANCEF) IV     PRN Meds: acetaminophen **OR** acetaminophen, ondansetron (ZOFRAN) IV, polyethylene glycol  ================================================= ================================================= Subjective:  Saw patient post biopsy.  Denied difficulty breathing when laying on his left side. Denied wanting to speak with a chaplain.  Patient is aware he will be transferred to [facility] for oncology treatment.    Objective: Temp:  [97.6 F (36.4 C)-97.9 F (36.6 C)] 97.6 F (36.4 C) (09/23 0507) Pulse Rate:  [96-113] 105 (09/23 0507) Resp:  [12-22] 20 (09/23 0507) BP: (117-163)/(68-99) 153/82 (09/23 0507) SpO2:  [97 %-100 %] 98 % (09/23 0507) Weight:  [81.8 kg-83.2 kg] 81.8 kg (09/23 0507) Intake/Output 09/22 0701 - 09/23 0700 In: 240 [P.O.:240] Out: 716 [Urine:716] Physical Exam:  Gen: laying in bed on left side.  No acute distress.  HEENT: enlarged veins on nose.  Diffuse swelling of neck.   Resp: No crackles or wheezes, per Dr. Saul Fordyce exam.  Psych: Cooperative with exam..patient appears saddened by news of his illness.     Laboratory: Recent Labs  Lab 08/22/18 1221  WBC 11.1*  HGB 8.3*  HCT 30.8*  PLT 535*   Recent Labs  Lab 08/22/18 1221  NA 134*  K 4.1  CL 99  CO2 24  BUN 8  CREATININE 0.62  CALCIUM 8.6*  GLUCOSE 100*   Imaging/Diagnostic Tests: Dg Chest 2 View  Result Date: 08/22/2018 CLINICAL DATA:  Acute shortness of breath. Fatigue and nausea/vomiting. EXAM: CHEST - 2 VIEW COMPARISON:  08/06/2006 FINDINGS: RIGHT mediastinal and RIGHT hilar masses are now identified. A 1.5 cm  nodular opacity overlying the UPPER LEFT lung noted. The visualized lungs are otherwise clear. No pleural effusion or pneumothorax. No acute bony abnormalities are identified. IMPRESSION: New RIGHT mediastinal and RIGHT hilar masses and possible LEFT lung nodule. Chest CT with contrast recommended for further evaluation. Electronically Signed   By: Margarette Canada M.D.   On: 08/22/2018 12:31   Ct Angio Chest Pe W And/or Wo Contrast  Result Date: 08/22/2018 CLINICAL DATA:  Patient began feeling ill 5 days ago with nausea, vomiting and fatigue. Shortness of breath today. Masses noted on the current chest radiograph. EXAM: CT ANGIOGRAPHY CHEST WITH CONTRAST TECHNIQUE: Multidetector CT imaging of the chest was performed using the standard protocol during bolus administration of intravenous contrast. Multiplanar CT image reconstructions and MIPs were obtained to evaluate the vascular anatomy. CONTRAST:  159mL ISOVUE-370 IOPAMIDOL (ISOVUE-370) INJECTION 76% COMPARISON:  Current chest radiograph. FINDINGS: Cardiovascular: There is satisfactory opacification of the pulmonary arteries to the segmental level. There is no evidence of a pulmonary embolism. Large right upper lobe and confluent mediastinal and hilar mass occludes the right upper lobe pulmonary artery. It narrows the right lower lobe pulmonary artery. The mass also invades the superior vena cava. It abuts and slightly deforms the innominate artery. Mass partly insert goals the ascending aorta and proximal aortic arch. Heart is normal in size and configuration. Trace amount pericardial fluid. Mild three-vessel coronary artery calcifications. Thoracic aorta is normal in caliber. No dissection. Mild thoracic aortic atherosclerosis. Mediastinum/Nodes: Large right upper lobe mass is contiguous with a large mediastinal mass extends from the right superior mediastinum, follow-up in the anterior, middle and posterior mediastinum, extending inferiorly to just above the  caval atrial junction. Mass narrows and invades the superior vena cava although the vessel is not excluded. Mass partly encases the innominate artery and right subclavian artery and abuts the right common carotid artery. Mass partly encases the ascending thoracic aorta proximal arch. Mass also abuts the trachea displacing it to the left. Trachea is narrowed, greatest narrowing in the anterior to posterior dimension. It measures 1.5 x 0.5 mm in transverse dimensions at the level of the top of the aortic arch. There are shotty prominent mildly enlarged mediastinal lymph nodes the prevascular space. Extends to surround the right hilar structures as described under the cardiovascular section. Mass is contiguous with an enlarged subcarinal lymph node measures 3.2 cm in short axis. There are prominent neck base lymph nodes. Largest lies at the right neck base measuring 2.9 cm short axis. Lungs/Pleura: Trace bilateral pleural effusions. Large medial right upper lobe mass contiguous with the mediastinal mass and right hilar mass. The overall size of this mass in mediastinal and right hilar components measures approximately 14 x 10 x 13 cm. There are lung nodules. Largest lies in the left upper lobe centered on image 77, series 9, measuring 15 x 13 mm. Nodule in the left lower lobe, image 109, measuring 6 mm. Nodule in the right upper lobe, image 100, measuring 6 mm. Elevation the right hemidiaphragm. Mild atelectasis lies in the right lower lobe above the elevated hemidiaphragm. There is mild atelectasis  adjacent to the large right upper lobe mass. Mild centrilobular emphysema. No convincing pneumonia. No pulmonary edema. No pneumothorax. Upper Abdomen: No acute findings. No liver or adrenal masses noted on the included field of view. Musculoskeletal: No fracture or acute finding. No osteoblastic or osteolytic lesions. Review of the MIP images confirms the above findings. IMPRESSION: 1. No evidence of a pulmonary embolism.  2. Large mass consistent with malignancy is centered on the right upper mediastinum and medial right upper lobe. It extends to surround the right hilar structures. The mass invades and narrows the superior vena cava. It occludes the right upper lobe pulmonary artery and narrows right lower lobe pulmonary artery. It abuts the aorta and innominate, right subclavian and right common carotid arteries. It also abuts and narrows esophagus. 3. Or is additional metastatic disease with metastatic adenopathy at the right neck base, small metastatic lung nodules and additional shotty mediastinal nodes that are presumed to be metastatic disease. 4. Trace pleural effusions. No convincing pulmonary edema. No pneumonia. Mild right lower lobe atelectasis. Aortic Atherosclerosis (ICD10-I70.0) and Emphysema (ICD10-J43.9). Electronically Signed   By: Lajean Manes M.D.   On: 08/22/2018 14:32      Benay Pike, MD 08/23/2018, 5:54 AM PGY-1, [facility] Intern pager: [phone], text pages welcome

## 2018-08-23 NOTE — Anesthesia Postprocedure Evaluation (Signed)
Anesthesia Post Note  Patient: Gerald Adkins  Procedure(s) Performed: BIOPSY OF RIGHT SCALENE LYMPH NODE (Right )     Patient location during evaluation: PACU Anesthesia Type: General Level of consciousness: awake and alert Pain management: pain level controlled Vital Signs Assessment: post-procedure vital signs reviewed and stable Respiratory status: spontaneous breathing, nonlabored ventilation, respiratory function stable and patient connected to nasal cannula oxygen Cardiovascular status: blood pressure returned to baseline and stable Postop Assessment: no apparent nausea or vomiting Anesthetic complications: no    Last Vitals:  Vitals:   08/23/18 1010 08/23/18 1020  BP: 104/90   Pulse: (!) 118 99  Resp: 15   Temp:    SpO2: 99%     Last Pain:  Vitals:   08/23/18 1047  TempSrc:   PainSc: 3                  Lilyannah Zuelke,W. EDMOND

## 2018-08-23 NOTE — Transfer of Care (Signed)
Immediate Anesthesia Transfer of Care Note  Patient: Gerald Adkins  Procedure(s) Performed: BIOPSY OF RIGHT SCALENE LYMPH NODE (Right )  Patient Location: PACU  Anesthesia Type:General  Level of Consciousness: awake, alert  and oriented  Airway & Oxygen Therapy: Patient Spontanous Breathing and Patient connected to face mask oxygen  Post-op Assessment: Report given to RN, Post -op Vital signs reviewed and stable and Patient moving all extremities  Post vital signs: Reviewed and stable  Last Vitals:  Vitals Value Taken Time  BP 118/79 08/23/2018  9:21 AM  Temp 36.4 C 08/23/2018  9:10 AM  Pulse 108 08/23/2018  9:23 AM  Resp 20 08/23/2018  9:23 AM  SpO2 100 % 08/23/2018  9:23 AM  Vitals shown include unvalidated device data.  Last Pain:  Vitals:   08/23/18 0910  TempSrc:   PainSc: 0-No pain         Complications: No apparent anesthesia complications

## 2018-08-23 NOTE — Progress Notes (Addendum)
      [address]       [place] [address]             [phone]      Patient awake and alert in RR. Extubated without difficulty Sinus tachycardia frequent pacs, monitor for afib  Case discussed with Dr Julien Nordmann and Dr Sondra Come [facility].  Wife did not ansewer phone, patietn asked to call son Kellin , discussed with him and made aware patient would need to transfer to [facility]   Grace Isaac MD      [address] [place] [address] Office [phone]   [place]

## 2018-08-23 NOTE — Progress Notes (Signed)
      [address]       ,[place] [address]             [phone]     Pre Procedure note for inpatients:   GREYSEN DEVINO has been scheduled for Procedure(s): RIGHT BIOPSY SCALENE NODE POSSIBLE RIGHT PARASTERNAL EXPLORATION AND BIOPSY (Right) today. The various methods of treatment have been discussed with the patient. After consideration of the risks, benefits and treatment options the patient has consented to the planned procedure.   The patient has been seen and labs reviewed. There are no changes in the patient's condition to prevent proceeding with the planned procedure today.  Recent labs:  Lab Results  Component Value Date   WBC 11.1 (H) 08/22/2018   HGB 8.3 (L) 08/22/2018   HCT 30.8 (L) 08/22/2018   PLT 535 (H) 08/22/2018   GLUCOSE 100 (H) 08/22/2018   ALT 23 04/19/2013   AST 26 04/19/2013   NA 134 (L) 08/22/2018   K 4.1 08/22/2018   CL 99 08/22/2018   CREATININE 0.62 08/22/2018   BUN 8 08/22/2018   CO2 24 08/22/2018   INR 1.10 08/22/2018   Case discussed with Dr Ola Spurr to plan anesthetic  coordination considering patients mediastinal mass   The goals risks and alternatives of the planned surgical procedure Procedure(s): RIGHT BIOPSY SCALENE NODE POSSIBLE RIGHT PARASTERNAL EXPLORATION AND BIOPSY (Right)  have been discussed with the patient in detail. The risks of the procedure including death, infection, stroke, myocardial infarction, bleeding, blood transfusion have all been discussed specifically.  I have quoted Azzie Almas a 3 % of perioperative mortality and a complication rate as high as 40 %. The patient's questions have been answered.JAYLON BOYLEN is willing  to proceed with the planned procedure.  Grace Isaac, MD 08/23/2018 7:18 AM

## 2018-08-23 NOTE — Discharge Summary (Signed)
[facility] TransferSummary  Patient name: Gerald Adkins Medical record number: [id number] Date of birth: [birth year] Age: 62 y.o. Gender: male Date of Admission: 08/22/2018  Date of Transfer: 08/23/2018 Admitting Physician: Martyn Malay, MD  Primary Care Provider: [name], MD Consultants: CVTS  Indication for Hospitalization: SVC syndrome from mediastinal mass  Transfer Diagnoses/Problem List:  SVC syndrome Mediastinal mass HTN Chronic back pain Substance abuse disorder  Disposition: to  hospital  Transfer Condition: stable.   Transfer Exam:  Gen: laying in bed on left side.  No acute distress.  HEENT: prominent nasal veins.  Diffuse swelling of neck. midly enlarged nontender submandibular nodes.  CV: regular rate and rhythm.  Resp: lungs clear to auscultation bilaterally.   GI: present bowel sounds. nontender to palpation in all four quadrants.  Psych: Cooperative with exam..patient appears saddened by news of his illness.  Brief Hospital Course:  Gerald Adkins a 61 y.o.malepresenting with facial swelling and dyspnea. PMH is significant for stroke, vertebral fracture, HTN, h/o tobacco use, marijuana use.   In the ED a thoracic CT was performed and a large mediastinal mass was found which was invading and narrowing the superior vena cava, occluding the Right upper lobe pulmonary artery and abutting several other arteries and esophagus.  Patient admitted to floor and biopsy was performed the next morning by CVTS. After surgery the patient was [facility]    Patient was also found to have iron deficiency anemia based on low iron, low ferritin, low MCV.      Issues for Follow Up:  1. Follow up on mediastinal mass biopsy. 2. Iron deficiency anemia.  Work up cause of iron deficiency.   Significant Procedures: biopsy of right calene lymph node  Significant Labs and Imaging:  Recent Labs  Lab 08/22/18 1221  08/23/18 0958  WBC 11.1* 10.9*  HGB 8.3* 8.5*  HCT 30.8* 30.6*  PLT 535* 528*   Recent Labs  Lab 08/22/18 1221 08/23/18 0958  NA 134* 135  K 4.1 4.1  CL 99 99  CO2 24 22  GLUCOSE 100* 130*  BUN 8 8  CREATININE 0.62 0.80  CALCIUM 8.6* 8.7*  ALKPHOS  --  65  AST  --  19  ALT  --  12  ALBUMIN  --  2.7*      Results/Tests Pending at Time of Transfer: biopsy results   Gerald Pike, MD 08/23/2018, 5:03 PM PGY-1, [place]

## 2018-08-23 NOTE — Progress Notes (Signed)
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 male who presented to the ED with facial swelling and dyspnea yesterday, 08/22/18. Chest CT showed new masses. The following chest CT scan revealed: 2. Large mass consistent with malignancy is centered on the right upper mediastinum and medial right upper lobe. It extends to surround the right hilar structures. The mass invades and narrows the superior vena cava. It occludes the right upper lobe pulmonary artery and narrows right lower lobe pulmonary artery. It abuts the aorta and innominate, right subclavian and right common carotid arteries. It also abuts and narrows esophagus. 3. Or is additional metastatic disease with metastatic adenopathy at the right neck base, small metastatic lung nodules and additional shotty mediastinal nodes that are presumed to be metastatic disease. 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 Patient is been transferred [place] to initiate urgent chest radiation treatments.  PREVIOUS RADIATION THERAPY: No  PAST MEDICAL HISTORY:  has a past medical history of Chronic back pain (1989), Hyperlipidemia, Hypertension, Opiate addiction ([place]), and Stroke ([place]).    PAST SURGICAL HISTORY: Past Surgical History:  Procedure Laterality Date  . APPENDECTOMY    . BACK SURGERY     lumbar spine, cut sciatic nerve  . CHOLECYSTECTOMY      FAMILY HISTORY: family history includes Breast cancer in his mother; Lung cancer in his maternal aunt; Pancreatic cancer in his  maternal aunt.  SOCIAL HISTORY:  reports that he quit smoking about 20 years ago. He has a 49.50 pack-year smoking history. He has never used smokeless tobacco. He reports that he drinks alcohol. He reports that he has current or past drug history. Drug: Marijuana. Frequency: 3.00 times per week.  ALLERGIES: Butterscotch flavor  MEDICATIONS:  No current facility-administered medications for this encounter.    No current outpatient medications on file.   Facility-Administered Medications Ordered in Other Encounters  Medication Dose Route Frequency Provider Last Rate Last Dose  . acetaminophen (TYLENOL) tablet 650 mg  650 mg Oral Q6H PRN Shirley, [place], DO   650 mg at 08/23/18 1318   Or  . acetaminophen (TYLENOL) suppository 650 mg  650 mg Rectal Q6H PRN Shirley, [place], DO      . fentaNYL (SUBLIMAZE) 100 MCG/2ML injection           . Influenza vac split quadrivalent PF (FLUARIX) injection 0.5 mL  0.5 mL Intramuscular Tomorrow-1000 [place] Derry, [place], DO      . ondansetron [facility]) injection 4 mg  4 mg Intravenous Q6H PRN Shirley, [place], DO      . pneumococcal 23 valent vaccine (PNU-IMMUNE) injection 0.5 mL  0.5 mL Intramuscular Tomorrow-1000 [place] Derry, [place], DO      . polyethylene glycol (MIRALAX / GLYCOLAX) packet 17 g  17 g Oral Daily PRN Shirley, [place], DO        REVIEW OF SYSTEMS:  REVIEW OF SYSTEMS: A 10+ POINT REVIEW OF SYSTEMS WAS OBTAINED including neurology, dermatology, psychiatry, cardiac, respiratory, lymph, extremities, GI, GU, musculoskeletal, constitutional, reproductive, HEENT. All pertinent positives are noted in the HPI. All others are negative.  He is  some pain in the right shoulder area. The patient has to lie on his left side to breathe comfortably. When lying on his back he notices worsening of his breathing and increased coughing.     PHYSICAL EXAM:  Vitals - 1 value per visit 9/92/4268  SYSTOLIC 341  DIASTOLIC 94  Pulse 962  Temperature 98.3  Respirations 20    Weight (lb) 180.4  Height   BMI    General: Alert and oriented, in no acute distress, lying in his hospital bed on his left side HEENT: Head is normocephalic. Extraocular movements are intact. Oropharynx is clear. poor dentition with several teeth missing Neck: Neck is supple, no palpable cervical or supraclavicular lymphadenopathy. Biopsy site noted along the right supraclavicular region. Edema noted in the neck more so along the left side Heart: Regular in rate and rhythm with no murmurs, rubs, or gallops. Chest: Clear to auscultation bilaterally, with no rhonchi, wheezes, or rales, good air movement throughout both lungs. Abdomen: Soft, nontender, nondistended, with no rigidity or guarding. Extremities: No cyanosis or edema. 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 Lymphatics: see Neck Exam Skin: No concerning lesions. Musculoskeletal: symmetric strength and muscle tone throughout. Neurologic: Cranial nerves II through XII are grossly intact. No obvious focalities. Speech is fluent. Coordination is intact. Psychiatric: Judgment and insight are intact. Affect is appropriate.    ECOG = 3  0 - Asymptomatic (Fully active, able to carry on all predisease activities without restriction)  1 - Symptomatic but completely ambulatory (Restricted in physically strenuous activity but ambulatory and able to carry out work of a light or sedentary nature. For example, light housework, office work)  2 - Symptomatic, <50% in bed during the day (Ambulatory and capable of all self care but unable to carry out any work activities. Up and about more than 50% of waking hours)  3 - Symptomatic, >50% in bed, but not bedbound (Capable of only limited self-care, confined to bed or chair 50% or more of waking hours)  4 - Bedbound (Completely disabled. Cannot carry on any self-care. Totally confined to bed or chair)  5 - Death   Eustace Pen MM, Creech RH, Tormey DC, et al. [phone]). "Toxicity and response criteria of  the [facility]". [place] Oncol. 5 (6): 649-55  LABORATORY DATA:  Lab Results  Component Value Date   WBC 10.9 (H) 08/23/2018   HGB 8.5 (L) 08/23/2018   HCT 30.6 (L) 08/23/2018   MCV 68.9 (L) 08/23/2018   PLT 528 (H) 08/23/2018   NEUTROABS 8.5 (H) 08/23/2018   Lab Results  Component Value Date   NA 135 08/23/2018   K 4.1 08/23/2018   CL 99 08/23/2018   CO2 22 08/23/2018   GLUCOSE 130 (H) 08/23/2018   CREATININE 0.80 08/23/2018   CALCIUM 8.7 (L) 08/23/2018      RADIOGRAPHY: Dg Chest 2 View  Result Date: 08/22/2018 CLINICAL DATA:  Acute shortness of breath. Fatigue and nausea/vomiting. EXAM: CHEST - 2 VIEW COMPARISON:  08/06/2006 FINDINGS: RIGHT mediastinal and RIGHT hilar masses are now identified. A 1.5 cm nodular opacity overlying the UPPER LEFT lung noted. The visualized lungs are otherwise clear. No pleural effusion or pneumothorax. No acute bony abnormalities are identified. IMPRESSION: New RIGHT mediastinal and RIGHT hilar masses and possible LEFT lung nodule. Chest CT with contrast recommended for further evaluation. Electronically Signed   By: Margarette Canada M.D.   On: 08/22/2018 12:31   Ct Angio Chest Pe W And/or  Wo Contrast  Result Date: 08/22/2018 CLINICAL DATA:  Patient began feeling ill 5 days ago with nausea, vomiting and fatigue. Shortness of breath today. Masses noted on the current chest radiograph. EXAM: CT ANGIOGRAPHY CHEST WITH CONTRAST TECHNIQUE: Multidetector CT imaging of the chest was performed using the standard protocol during bolus administration of intravenous contrast. Multiplanar CT image reconstructions and MIPs were obtained to evaluate the vascular anatomy. CONTRAST:  158mL ISOVUE-370 IOPAMIDOL (ISOVUE-370) INJECTION 76% COMPARISON:  Current chest radiograph. FINDINGS: Cardiovascular: There is satisfactory opacification of the pulmonary arteries to the segmental level. There is no evidence of a pulmonary embolism. Large right  upper lobe and confluent mediastinal and hilar mass occludes the right upper lobe pulmonary artery. It narrows the right lower lobe pulmonary artery. The mass also invades the superior vena cava. It abuts and slightly deforms the innominate artery. Mass partly insert goals the ascending aorta and proximal aortic arch. Heart is normal in size and configuration. Trace amount pericardial fluid. Mild three-vessel coronary artery calcifications. Thoracic aorta is normal in caliber. No dissection. Mild thoracic aortic atherosclerosis. Mediastinum/Nodes: Large right upper lobe mass is contiguous with a large mediastinal mass extends from the right superior mediastinum, follow-up in the anterior, middle and posterior mediastinum, extending inferiorly to just above the caval atrial junction. Mass narrows and invades the superior vena cava although the vessel is not excluded. Mass partly encases the innominate artery and right subclavian artery and abuts the right common carotid artery. Mass partly encases the ascending thoracic aorta proximal arch. Mass also abuts the trachea displacing it to the left. Trachea is narrowed, greatest narrowing in the anterior to posterior dimension. It measures 1.5 x 0.5 mm in transverse dimensions at the level of the top of the aortic arch. There are shotty prominent mildly enlarged mediastinal lymph nodes the prevascular space. Extends to surround the right hilar structures as described under the cardiovascular section. Mass is contiguous with an enlarged subcarinal lymph node measures 3.2 cm in short axis. There are prominent neck base lymph nodes. Largest lies at the right neck base measuring 2.9 cm short axis. Lungs/Pleura: Trace bilateral pleural effusions. Large medial right upper lobe mass contiguous with the mediastinal mass and right hilar mass. The overall size of this mass in mediastinal and right hilar components measures approximately 14 x 10 x 13 cm. There are lung nodules.  Largest lies in the left upper lobe centered on image 77, series 9, measuring 15 x 13 mm. Nodule in the left lower lobe, image 109, measuring 6 mm. Nodule in the right upper lobe, image 100, measuring 6 mm. Elevation the right hemidiaphragm. Mild atelectasis lies in the right lower lobe above the elevated hemidiaphragm. There is mild atelectasis adjacent to the large right upper lobe mass. Mild centrilobular emphysema. No convincing pneumonia. No pulmonary edema. No pneumothorax. Upper Abdomen: No acute findings. No liver or adrenal masses noted on the included field of view. Musculoskeletal: No fracture or acute finding. No osteoblastic or osteolytic lesions. Review of the MIP images confirms the above findings. IMPRESSION: 1. No evidence of a pulmonary embolism. 2. Large mass consistent with malignancy is centered on the right upper mediastinum and medial right upper lobe. It extends to surround the right hilar structures. The mass invades and narrows the superior vena cava. It occludes the right upper lobe pulmonary artery and narrows right lower lobe pulmonary artery. It abuts the aorta and innominate, right subclavian and right common carotid arteries. It also abuts and narrows esophagus. 3.  Or is additional metastatic disease with metastatic adenopathy at the right neck base, small metastatic lung nodules and additional shotty mediastinal nodes that are presumed to be metastatic disease. 4. Trace pleural effusions. No convincing pulmonary edema. No pneumonia. Mild right lower lobe atelectasis. Aortic Atherosclerosis (ICD10-I70.0) and Emphysema (ICD10-J43.9). Electronically Signed   By: Lajean Manes M.D.   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  Preliminary diagnosis is suggestive of lymphoma or possibly germ cell tumor. Patient has significant tumor within the chest which is causing significant narrowing of of his trachea and mainstem bronchi as well as  resulting in superior vena cava narrowing. The Patient will be a good candidate for urgent radiation treatments directed at the chest area. I discussed the course of treatment side effects and potential toxicities of radiation therapy with the patient this evening. He appears to understand and wishes to proceed with planned course of treatment.  PLAN:patient will brought down for CT simulation and planning tomorrow morning and begin his treatments tomorrow. He will Receive 3 high-dose radiation treatments (3.0 Gy x 3) directed at the largest chest mass. Depending on final diagnosis he will likely then proceed with chemotherapy at that time.     ------------------------------------------------  Blair Promise, PhD, MD  This document serves as a record of services personally performed by Gery Pray, MD. It was created on his behalf by Wilburn Mylar, a trained medical scribe. The creation of this record is based on the scribe's personal observations and the provider's statements to them. This document has been checked and approved by the attending provider.

## 2018-08-23 NOTE — Progress Notes (Signed)
Patient transferred from [facility]. VSS, tele applied. Agree with previous RN assessment. Denies any needs at this time. Will continue to monitor.

## 2018-08-23 NOTE — Brief Op Note (Signed)
      [address]       [place] [address]             [phone]     08/23/2018  9:02 AM  PATIENT:  Gerald Adkins  62 y.o. 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   Delay start of Pharmacological VTE agent (>24hrs) due to surgical blood loss or risk of bleeding: yes

## 2018-08-23 NOTE — Anesthesia Procedure Notes (Signed)
Procedure Name: Intubation Date/Time: 08/23/2018 7:46 AM Performed by: Leonor Liv, CRNA Pre-anesthesia Checklist: Patient identified, Emergency Drugs available, Suction available and Patient being monitored Patient Re-evaluated:Patient Re-evaluated prior to induction Oxygen Delivery Method: Circle System Utilized Preoxygenation: Pre-oxygenation with 100% oxygen Induction Type: IV induction Ventilation: Mask ventilation without difficulty Laryngoscope Size: Mac and 4 Grade View: Grade I Tube type: Subglottic suction tube Tube size: 8.0 mm Number of attempts: 1 Airway Equipment and Method: Stylet and LTA kit utilized Placement Confirmation: ETT inserted through vocal cords under direct vision,  positive ETCO2 and breath sounds checked- equal and bilateral Secured at: 23 cm Tube secured with: Tape Dental Injury: Teeth and Oropharynx as per pre-operative assessment

## 2018-08-23 NOTE — Plan of Care (Signed)

## 2018-08-24 ENCOUNTER — Encounter (HOSPITAL_COMMUNITY): Payer: Self-pay | Admitting: Cardiothoracic Surgery

## 2018-08-24 ENCOUNTER — Ambulatory Visit
Admit: 2018-08-24 | Discharge: 2018-08-24 | Disposition: A | Discharge status: Home / Self Care | Payer: Medicare Other | Source: Ambulatory Visit | Attending: Radiation Oncology | Admitting: Radiation Oncology

## 2018-08-24 ENCOUNTER — Ambulatory Visit
Admit: 2018-08-24 | Discharge: 2018-08-24 | Disposition: A | Discharge status: Home / Self Care | Payer: Medicare Other | Attending: Radiation Oncology | Admitting: Radiation Oncology

## 2018-08-24 DIAGNOSIS — I871 Compression of vein: Secondary | ICD-10-CM

## 2018-08-24 DIAGNOSIS — Z510 Encounter for antineoplastic radiation therapy: Secondary | ICD-10-CM | POA: Insufficient documentation

## 2018-08-24 MED ORDER — SODIUM CHLORIDE 0.9 % IV SOLN
510.0000 mg | Freq: Once | INTRAVENOUS | Status: AC
  Administered 2018-08-24: 510 mg via INTRAVENOUS
  Filled 2018-08-24: qty 17

## 2018-08-24 MED ORDER — AMLODIPINE BESYLATE 5 MG PO TABS
5.0000 mg | ORAL_TABLET | Freq: Every day | ORAL | Status: DC
  Administered 2018-08-24 – 2018-08-26 (×3): 5 mg via ORAL
  Filled 2018-08-24 (×3): qty 1

## 2018-08-24 NOTE — Progress Notes (Signed)
PROGRESS NOTE [facility]   ADGER CANTERA   VXB:[id number] DOB: Sep 11, 1956  DOA: 08/22/2018 PCP: [name], MD   Brief Narrative:  Gerald Adkins is a 62 year old male with past medical history significant for stroke, patellar fracture, hypertension and tobacco abuse presented to the emergency department complaining of facial swelling dyspnea.  Upon ED evaluation thoracic CT was performed and showed large mediastinal mass which was found invading and narrowing the superior vena cava, occluding the right upper lobe pulmonary artery.  Several older arteries in the esophagus.  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  Patient was transferred to [facility] hospital from family medicine service to receive urgent radiotherapy.  Subjective: Patient seen and examined, he reported breathing has been stable, however has not significantly improved.  Denies chest pain and palpitations.  Unable to sleep well last night.  Complaining of back pain and nausea.  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 Depending on treatment it may need to consider palliative care consult for goals of care.  Iron deficiency anemia Malignancy also possibly affecting hemoglobin.  Will give IV Feraheme.  Continue to monitor H&H.  Transfuse if hemoglobin less than 7.  No signs of overt bleeding  Hypertension She was not taking blood pressure medications prior to admission, he has been above goal during hospital stay will start amlodipine 5 mg and continue to monitor blood pressure.  Chronic  back pain Stable, continue current pain management  Chronic nausea and vomiting Patient uses as needed Zofran, this is likely related to marijuana abuse Continue Zofran as needed, monitor QTC sporadically.  DVT prophylaxis: SCDs Code Status: Full code Family Communication: None at bedside Disposition Plan: TBD  Consultants:   Rad oncology  Procedures:   Mediastinal biopsy   Antimicrobials: Anti-infectives (From admission, onward)   Start     Dose/Rate Route Frequency Ordered Stop   08/23/18 0715  ceFAZolin (ANCEF) IVPB 2g/100 mL premix     2 g 200 mL/hr over 30 Minutes Intravenous 30 min pre-op 08/22/18 1925 08/23/18 0730        Objective: Vitals:   08/23/18 2028 08/24/18 0535 08/24/18 0842 08/24/18 1329  BP: (!) 127/91 (!) 152/96  (!) 155/96  Pulse: 97 98  (!) 115  Resp: 12 16    Temp: 97.8 F (36.6 C) 97.7 F (36.5 C)  (!) 97.5 F (36.4 C)  TempSrc: Oral Oral  Oral  SpO2: 98% 100% 96% 100%  Weight:      Height:       No intake or output data in the 24 hours ending 08/24/18 1732 Filed Weights   08/22/18 1644 08/23/18 0507  Weight: 83.2 kg 81.8 kg    Examination:  General exam: Appears calm and comfortable  HEENT: Neck swelling Respiratory system: Breath sound diminished mainly on the right, no crackle or wheezing.  Cardiovascular system: S1 & S2 heard, RRR. No JVD, murmurs, rubs or gallops Gastrointestinal system: Abdomen is nondistended, soft and nontender. Central nervous system: Alert and oriented. No focal neurological deficits. Extremities: left hand amputation, no LE edema  Skin: Plethoric face Psychiatry: Mood & affect appropriate.  Data Reviewed: I have personally reviewed following labs and imaging studies  CBC: Recent Labs  Lab 08/22/18 1221 08/23/18 0958  WBC 11.1* 10.9*  NEUTROABS  --  8.5*  HGB 8.3* 8.5*  HCT 30.8* 30.6*  MCV 70.2* 68.9*  PLT 535* 423*   Basic Metabolic Panel: Recent Labs  Lab 08/22/18 1221  08/23/18 0958  NA 134* 135  K 4.1 4.1  CL 99 99  CO2 24 22  GLUCOSE 100* 130*  BUN 8 8  CREATININE 0.62 0.80  CALCIUM 8.6* 8.7*   GFR: Estimated Creatinine Clearance: 111 mL/min (by C-G formula based on SCr of 0.8 mg/dL). Liver Function Tests: Recent Labs  Lab 08/23/18 0958  AST 19  ALT 12  ALKPHOS 65  BILITOT 0.5  PROT 6.5  ALBUMIN 2.7*   No results for input(s): LIPASE, AMYLASE in the last 168 hours. No results for input(s): AMMONIA in the last 168 hours. Coagulation Profile: Recent Labs  Lab 08/22/18 2049  INR 1.10   Cardiac Enzymes: No results for input(s): CKTOTAL, CKMB, CKMBINDEX, TROPONINI in the last 168 hours. BNP (last 3 results) No results for input(s): PROBNP in the last 8760 hours. HbA1C: No results for input(s): HGBA1C in the last 72 hours. CBG: No results for input(s): GLUCAP in the last 168 hours. Lipid Profile: No results for input(s): CHOL, HDL, LDLCALC, TRIG, CHOLHDL, LDLDIRECT in the last 72 hours. Thyroid Function Tests: No results for input(s): TSH, T4TOTAL, FREET4, T3FREE, THYROIDAB in the last 72 hours. Anemia Panel: Recent Labs    08/23/18 0958  VITAMINB12 562  FOLATE 11.8  FERRITIN 22*  TIBC 322  IRON 13*  RETICCTPCT 1.8   Sepsis Labs: No results for input(s): PROCALCITON, LATICACIDVEN in the last 168 hours.  Recent Results (from the past 240 hour(s))  Surgical pcr screen     Status: None   Collection Time: 08/22/18  8:07 PM  Result Value Ref Range Status   MRSA, PCR NEGATIVE NEGATIVE Final   Staphylococcus aureus NEGATIVE NEGATIVE Final    Comment: (NOTE) The Xpert SA Assay (FDA approved for NASAL specimens in patients 10 years of age and older), is one component of a comprehensive surveillance program. It is not intended to diagnose infection nor to guide or monitor treatment. Performed at [facility], [place] [address] [place], [place] [address]       Radiology Studies: No results found.    Scheduled  Meds: . Influenza vac split quadrivalent PF  0.5 mL Intramuscular Tomorrow-1000  . pneumococcal 23 valent vaccine  0.5 mL Intramuscular Tomorrow-1000   Continuous Infusions:   LOS: 2 days    Time spent: Total of 25 minutes spent with pt, greater than 50% of which was spent in discussion of  treatment, counseling and coordination of care   Chipper Oman, MD Pager: Text Page via [email or web address]   If 7PM-7AM, please contact night-coverage [email or web address] 08/24/2018, 5:32 PM   Note - This record has been created using [employer]. Chart creation errors have been sought, but may not always have been located. Such creation errors do not reflect on the standard of medical care.

## 2018-08-24 NOTE — Progress Notes (Signed)
  Radiation Oncology         (336) [phone] ________________________________  Name: Gerald Adkins MRN: [id number]  Date: 08/24/2018  DOB: 18-Feb-1956  Simulation Verification Note    ICD-10-CM   1. SVC syndrome I87.1     Status: inpatient  NARRATIVE: The patient was brought to the treatment unit and placed in the planned treatment position. The clinical setup was verified. Then port films were obtained and uploaded to the radiation oncology medical record software.  The treatment beams were carefully compared against the planned radiation fields. The position location and shape of the radiation fields was reviewed. They targeted volume of tissue appears to be appropriately covered by the radiation beams. Organs at risk appear to be excluded as planned.  Based on my personal review, I approved the simulation verification. The patient's treatment will proceed as planned.  -----------------------------------  Blair Promise, PhD, MD

## 2018-08-24 NOTE — Op Note (Signed)
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  PROCEDURE PERFORMED:  Right scalene node biopsy.  BRIEF HISTORY:  The patient is a 62 year old male with previous history of smoking who has had a month or more history of increasing cough when he lies flat, increasing shortness of breath.  He came to the emergency room the morning prior to surgery  because of significant shortness of breath, cough and facial swelling predominantly on the left side of his face.  CT scan of the chest showed a large mediastinal mass extending up into the right lower neck.  The patient was seen to obtain a tissue  diagnosis.  Scalene node biopsy, possible right paratracheal exploration was recommended to the patient who agreed and signed informed consent.  DESCRIPTION OF PROCEDURE:  The patient was brought to the operating room after signing informed consent.  Initially, we discussed with anesthesia proceeding with MAC anesthesia and biopsying a palpable mass in the right lower neck.  However, because the  patient was uncomfortable lying still on his back even with the head elevated slightly, we proceeded with general endotracheal anesthesia.  The patient tolerated this well.  The right neck and chest was prepped with Betadine, draped in a sterile manner.   Appropriate timeout was performed.  Incision was made at the base of the right neck.  The right side had preoperatively been marked.  Dissection was carried down through the platysma and a 3-4 cm mass was identifiable at the base of the neck.  Bleeding  was controlled with  electrocautery unit.  A portion of the mass was excised and submitted to pathology for frozen section.  In addition, a second cup of similar specimen was sent for further studies as necessary to make the diagnosis.  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  Further discussion with pathology in view suggested that this could also be a germ cell tumor.  Pathology confirmed we had sufficient tissue both for flow cytometry and further stains.  The incision  was hemostatic.  The platysma was closed with interrupted 3-0 Vicryl sutures and a 4-0 subcuticular stitch in skin edges.  Dermabond was applied on the incision.  Sponge and needle count was reported as correct at completion of the procedure.  Blood  loss was minimal.  The patient was then awakened and extubated in the operating room without difficulty and was transferred to the recovery room for further postoperative care.  TN/NUANCE  D:08/24/2018 T:08/24/2018 JOB:[id number]

## 2018-08-24 NOTE — Progress Notes (Signed)
  Radiation Oncology         [phone] ________________________________  Name: Gerald Adkins MRN: [id number]  Date: 08/24/2018  DOB: [birth year]  SIMULATION AND TREATMENT PLANNING NOTE    ICD-10-CM   1. 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  Identity was confirmed.  All relevant records and images related to the planned course of therapy were reviewed.  The patient freely provided informed written consent to proceed with treatment after reviewing the details related to the planned course of therapy. The consent form was witnessed and verified by the simulation staff.  Then, the patient was set-up in a stable reproducible  Supine/ slant position in light of breathing issues for radiation therapy.  CT images were obtained.  Surface markings were placed.  The CT images were loaded into the planning software.  Then the target and avoidance structures were contoured.  Treatment planning then occurred.  The radiation prescription was entered and confirmed.  Then, I designed and supervised the construction of a total of 3 medically necessary complex treatment devices.  I have requested : Isodose Plan.  I have ordered:dose calc.  PLAN:  The patient will initially receive 9.0 Gy in 3 fractions.  -----------------------------------  Blair Promise, PhD, MD

## 2018-08-25 ENCOUNTER — Ambulatory Visit
Admit: 2018-08-25 | Discharge: 2018-08-25 | Disposition: A | Discharge status: Home / Self Care | Payer: Medicare Other | Attending: Radiation Oncology | Admitting: Radiation Oncology

## 2018-08-25 DIAGNOSIS — F17200 Nicotine dependence, unspecified, uncomplicated: Secondary | ICD-10-CM

## 2018-08-25 DIAGNOSIS — C781 Secondary malignant neoplasm of mediastinum: Secondary | ICD-10-CM

## 2018-08-25 DIAGNOSIS — Z8673 Personal history of transient ischemic attack (TIA), and cerebral infarction without residual deficits: Secondary | ICD-10-CM

## 2018-08-25 DIAGNOSIS — C7801 Secondary malignant neoplasm of right lung: Secondary | ICD-10-CM

## 2018-08-25 DIAGNOSIS — I2510 Atherosclerotic heart disease of native coronary artery without angina pectoris: Secondary | ICD-10-CM

## 2018-08-25 DIAGNOSIS — D491 Neoplasm of unspecified behavior of respiratory system: Secondary | ICD-10-CM

## 2018-08-25 DIAGNOSIS — C439 Malignant melanoma of skin, unspecified: Secondary | ICD-10-CM

## 2018-08-25 DIAGNOSIS — E785 Hyperlipidemia, unspecified: Secondary | ICD-10-CM

## 2018-08-25 DIAGNOSIS — R0602 Shortness of breath: Secondary | ICD-10-CM

## 2018-08-25 LAB — BASIC METABOLIC PANEL
Anion gap: 10 (ref 5–15)
BUN: 16 mg/dL (ref 6–20)
CALCIUM: 9.2 mg/dL (ref 8.9–10.3)
CHLORIDE: 97 mmol/L — AB (ref 98–111)
CO2: 29 mmol/L (ref 22–32)
CREATININE: 0.66 mg/dL (ref 0.61–1.24)
Glucose, Bld: 117 mg/dL — ABNORMAL HIGH (ref 70–99)
Potassium: 4.6 mmol/L (ref 3.5–5.1)
SODIUM: 136 mmol/L (ref 135–145)

## 2018-08-25 LAB — CBC
HCT: 31.2 % — ABNORMAL LOW (ref 39.0–52.0)
Hemoglobin: 8.8 g/dL — ABNORMAL LOW (ref 13.0–17.0)
MCH: 19.2 pg — AB (ref 26.0–34.0)
MCHC: 28.2 g/dL — AB (ref 30.0–36.0)
MCV: 68.1 fL — ABNORMAL LOW (ref 78.0–100.0)
PLATELETS: 647 10*3/uL — AB (ref 150–400)
RBC: 4.58 MIL/uL (ref 4.22–5.81)
RDW: 17.3 % — AB (ref 11.5–15.5)
WBC: 10.4 10*3/uL (ref 4.0–10.5)

## 2018-08-25 LAB — MAGNESIUM: MAGNESIUM: 2.1 mg/dL (ref 1.7–2.4)

## 2018-08-25 MED ORDER — BENZONATATE 100 MG PO CAPS: 100.0000 mg | ORAL_CAPSULE | Freq: Three times a day (TID) | ORAL | Status: DC | PRN

## 2018-08-25 NOTE — Progress Notes (Signed)
Oxygen sat at rest is 96% on RA.  Pt ambulated in the hall approx 147ft.  No complaints of SOB, oxygen sat remained at 92-94%.

## 2018-08-25 NOTE — Progress Notes (Signed)
PROGRESS NOTE    Gerald Adkins  UDJ:[id number] DOB: 03-11-1956 DOA: 08/22/2018 PCP: [name], MD    Brief Narrative:  62 year old male with past medical history significant for stroke, patellar fracture, hypertension and tobacco abuse presented to the emergency department complaining of facial swelling dyspnea.  Upon ED evaluation thoracic CT was performed and showed large mediastinal mass which was found invading and narrowing the superior vena cava, occluding the right upper lobe pulmonary artery.  Several older arteries in the esophagus.  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  Patient was transferred to [facility] long hospital from family medicine service to receive urgent radiotherapy.  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   -Patient given IV Feraheme.  -Plan to transfuse if hemoglobin less than 7.   -Patient without signs of overt bleeding  Hypertension -Patient reportedly not taking blood pressure medications prior to admission -Patient was started on amlodipine 5 mg   Chronic back pain -Remained stable, continue current pain management  Chronic nausea and vomiting -Patient uses PRN Zofran, this is likely related to marijuana abuse Continue Zofran as needed, monitor QTC sporadically. -Stable this  AM  DVT prophylaxis: SCD's Code Status: Full Family Communication: Pt in room, family not at bedside Disposition Plan: Uncertain at this time  Consultants:   CT surgery  Oncology  Radiation Oncology  Procedures:   Radiation tx  Mediastinal biopsy  Antimicrobials: Anti-infectives (From admission, onward)   Start     Dose/Rate Route Frequency Ordered Stop   08/23/18 0715  ceFAZolin (ANCEF) IVPB 2g/100 mL premix     2 g 200 mL/hr over 30 Minutes Intravenous 30 min pre-op 08/22/18 1925 08/23/18 0730       Subjective: Complaining of cough this AM  Objective: Vitals:   08/24/18 1329 08/24/18 2036 08/25/18 0526 08/25/18 1414  BP: (!) 155/96 135/75 (!) 143/94 129/80  Pulse: (!) 115 (!) 113 98 (!) 110  Resp:  12 16   Temp: (!) 97.5 F (36.4 C) 97.7 F (36.5 C) 97.6 F (36.4 C) 98 F (36.7 C)  TempSrc: Oral Oral Oral Oral  SpO2: 100% 98% 100% 97%  Weight:      Height:        Intake/Output Summary (Last 24 hours) at 08/25/2018 1720 Last data filed at 08/25/2018 1500 Gross per 24 hour  Intake 600 ml  Output 300 ml  Net 300 ml   Filed Weights   08/22/18 1644 08/23/18 0507  Weight: 83.2 kg 81.8 kg    Examination:  General exam: Appears calm and comfortable  Respiratory system: coarse breath sounds. Respiratory effort normal. Cardiovascular system: S1 & S2 heard, RRR Gastrointestinal system: Abdomen is nondistended, soft and nontender. No organomegaly or masses felt. Normal bowel sounds heard. Central nervous system: Alert and oriented. No focal neurological deficits. Extremities: Symmetric 5 x 5 power. Skin: No rashes, lesions  Psychiatry: Judgement and insight appear normal. Mood & affect appropriate.  Data Reviewed: I have personally reviewed following labs and imaging studies  CBC: Recent Labs  Lab 08/22/18 1221 08/23/18 0958 08/25/18 0529  WBC 11.1* 10.9* 10.4  NEUTROABS  --  8.5*  --   HGB 8.3* 8.5* 8.8*  HCT 30.8* 30.6* 31.2*  MCV 70.2*  68.9* 68.1*  PLT 535* 528* 791*   Basic Metabolic Panel: Recent Labs  Lab 08/22/18 1221 08/23/18 0958 08/25/18 0529  NA 134* 135 136  K 4.1 4.1 4.6  CL 99 99 97*  CO2 24 22 29   GLUCOSE 100* 130* 117*  BUN 8 8 16   CREATININE 0.62 0.80 0.66  CALCIUM 8.6* 8.7* 9.2  MG  --   --  2.1   GFR: Estimated Creatinine Clearance: 111 mL/min (by C-G formula based on SCr of 0.66 mg/dL). Liver Function Tests: Recent Labs  Lab 08/23/18 0958  AST 19  ALT 12  ALKPHOS 65  BILITOT 0.5  PROT 6.5  ALBUMIN 2.7*   No results for input(s): LIPASE, AMYLASE in the last 168 hours. No results for input(s): AMMONIA in the last 168 hours. Coagulation Profile: Recent Labs  Lab 08/22/18 2049  INR 1.10   Cardiac Enzymes: No results for input(s): CKTOTAL, CKMB, CKMBINDEX, TROPONINI in the last 168 hours. BNP (last 3 results) No results for input(s): PROBNP in the last 8760 hours. HbA1C: No results for input(s): HGBA1C in the last 72 hours. CBG: No results for input(s): GLUCAP in the last 168 hours. Lipid Profile: No results for input(s): CHOL, HDL, LDLCALC, TRIG, CHOLHDL, LDLDIRECT in the last 72 hours. Thyroid Function Tests: No results for input(s): TSH, T4TOTAL, FREET4, T3FREE, THYROIDAB in the last 72 hours. Anemia Panel: Recent Labs    08/23/18 0958  VITAMINB12 562  FOLATE 11.8  FERRITIN 22*  TIBC 322  IRON 13*  RETICCTPCT 1.8   Sepsis Labs: No results for input(s): PROCALCITON, LATICACIDVEN in the last 168 hours.  Recent Results (from the past 240 hour(s))  Surgical pcr screen     Status: None   Collection Time: 08/22/18  8:07 PM  Result Value Ref Range Status   MRSA, PCR NEGATIVE NEGATIVE Final   Staphylococcus aureus NEGATIVE NEGATIVE Final    Comment: (NOTE) The Xpert SA Assay (FDA approved for NASAL specimens in patients 35 years of age and older), is one component of a comprehensive surveillance program. It is not intended to diagnose infection nor to guide or  monitor treatment. Performed at [facility], [place] [address] [place], [place] [address]      Radiology Studies: No results found.  Scheduled Meds: . amLODipine  5 mg Oral Daily  . Influenza vac split quadrivalent PF  0.5 mL Intramuscular Tomorrow-1000  . pneumococcal 23 valent vaccine  0.5 mL Intramuscular Tomorrow-1000   Continuous Infusions:   LOS: 3 days   Marylu Lund, MD [facility] Pager On Amion  If 7PM-7AM, please contact night-coverage 08/25/2018, 5:20 PM

## 2018-08-25 NOTE — Consult Note (Signed)
[place] [phone]   [phone]  CONSULT NOTE  REFERRING PHYSICIAN: Dr. 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  HPI ARJUN HARD is a 62 y.o. male with past medical history significant for hypertension, dyslipidemia, history of stroke, coronary artery disease as well as history of chronic back pain and opioid addiction.  The patient also has a long history of tobacco abuse.  He presented to the emergency department at [facility] complaining of shortness of breath worse when laying down.  He also has swelling of the right side of the face as well as neck area.  Chest x-ray performed on 08/22/2018 showed new right mediastinal and right hilar masses with possible left lung nodule.  This was followed by CT angiogram scan of the chest on 08/22/2018 and that showed a large right upper lobe mass contiguous with a right mediastinal mass extends from the right superior mediastinum, follow-up in the anterior, middle and posterior mediastinum extending inferiorly to just above the caval atrial junction.  The mass narrows and invades the superior vena cava although the vessel is not excluded.  The mass partially encases the innominate artery and the right subclavian artery and abuts the right common carotid artery.  The mass partially encases the ascending thoracic aorta proximal arch and abuts the trachea displacing it to the left.  The trachea is narrowed.  There are shortly prominent mildly enlarged mediastinal lymph nodes in the prevascular space.  The mass is contiguous with an enlarged subcarinal lymph node measuring 3.2 cm in short axis.  There are prominent neck base lymph nodes the largest lies at the right neck base measuring 2.9 cm in short axis.  There was also a large medial right upper lobe mass contiguous with the medial tunnel mass and right hilar mass.  The overall size of  this mass in the mediastinum and right hilar component measured approximately 14 x 10 x 13 cm.  There are lung nodules including left upper lobe measuring 1.5 x 1.3 cm, nodule in the left lower lobe measuring 0.6 cm and nodule in the right upper lobe measuring 0.6 cm.  The patient was seen by Dr. Servando Snare he underwent right scalene node biopsy on 08/23/2018.  The final pathology [id number] that was reported at the time of this dictation on 08/26/2018 was consistent with malignant melanoma The patient was transferred to [facility] and he was a started on palliative radiation because of the SVC syndrome. I was consulted to evaluate the patient and give recommendation regarding treatment of his condition. When seen today the patient continues to complain of mild cough as well as shortness of breath at baseline increased with exertion as well as the facial and neck swelling.  He denied having any significant chest pain.  He denied having any hemoptysis.  He has no nausea, vomiting, diarrhea or constipation.  He denied having any fever or chills. Family history significant for mother with breast cancer, maternal aunt with lung cancer and another maternal aunt with pancreatic cancer. The patient is married and has 1 son.  He is to work as a Animator doing multiple jobs. He has a long history of smoking.  He also has a history of alcohol abuse in the past but not recently.  The patient also has a history of opioid abuse in the past. HPI  Past Medical History:  Diagnosis Date  .  Chronic back pain 1989   broke back  . Hyperlipidemia   . Hypertension   . Opiate addiction ([place])    remote  . Stroke [facility])     Past Surgical History:  Procedure Laterality Date  . APPENDECTOMY    . BACK SURGERY     lumbar spine, cut sciatic nerve  . CHOLECYSTECTOMY    . SCALENE NODE BIOPSY Right 08/23/2018   Procedure: BIOPSY OF RIGHT SCALENE LYMPH NODE;  Surgeon: Grace Isaac, MD;  Location: [facility] OR;   Service: Thoracic;  Laterality: Right;    Family History  Problem Relation Age of Onset  . Breast cancer Mother        multiple types  . Lung cancer Maternal Aunt        nonsmoking  . Pancreatic cancer Maternal Aunt     Social History Social History   Tobacco Use  . Smoking status: Former Smoker    Packs/day: 1.50    Years: 33.00    Pack years: 49.50    Last attempt to quit: 1999    Years since quitting: 20.7  . Smokeless tobacco: Never Used  Substance Use Topics  . Alcohol use: Yes  . Drug use: Yes    Frequency: 3.0 times per week    Types: Marijuana    Allergies  Allergen Reactions  . Butterscotch Flavor Anaphylaxis and Shortness Of Breath    Current Facility-Administered Medications  Medication Dose Route Frequency Provider Last Rate Last Dose  . acetaminophen (TYLENOL) tablet 650 mg  650 mg Oral Q6H PRN Shirley, [place], DO   650 mg at 08/23/18 1318   Or  . acetaminophen (TYLENOL) suppository 650 mg  650 mg Rectal Q6H PRN Shirley, [place], DO      . amLODipine (NORVASC) tablet 5 mg  5 mg Oral Daily Patrecia Pour, [name], MD   5 mg at 08/24/18 2037  . HYDROcodone-acetaminophen (NORCO/VICODIN) 5-325 MG per tablet 1-2 tablet  1-2 tablet Oral Q6H PRN Schorr, Rhetta Mura, NP   2 tablet at 08/25/18 0739  . Influenza vac split quadrivalent PF (FLUARIX) injection 0.5 mL  0.5 mL Intramuscular Tomorrow-1000 [place] Derry, [place], DO      . ondansetron [facility]) injection 4 mg  4 mg Intravenous Q6H PRN Shirley, [place], DO   4 mg at 08/25/18 0739  . pneumococcal 23 valent vaccine (PNU-IMMUNE) injection 0.5 mL  0.5 mL Intramuscular Tomorrow-1000 En[place] Derry, [place], DO      . polyethylene glycol (MIRALAX / GLYCOLAX) packet 17 g  17 g Oral Daily PRN Shirley, [place], DO        Review of Systems  Constitutional: positive for fatigue and weight loss Eyes: negative Ears, nose, mouth, throat, and face: negative Respiratory: positive for cough, dyspnea on exertion and wheezing Cardiovascular:  negative Gastrointestinal: negative Genitourinary:negative Integument/breast: negative Hematologic/lymphatic: negative Musculoskeletal:negative Neurological: negative Behavioral/Psych: negative Endocrine: negative Allergic/Immunologic: negative  Physical Exam  MHD:QQIWL, healthy, no distress, well nourished, well developed and mild distress SKIN: skin color, texture, turgor are normal, no rashes or significant lesions HEAD: swelling of the neck and face EYES: normal, PERRLA, Conjunctiva are pink and non-injected EARS: External ears normal, Canals clear OROPHARYNX:no exudate, no erythema and lips, buccal mucosa, and tongue normal  NECK: supple, no adenopathy, swelling of the neck LYMPH:  no palpable lymphadenopathy, no hepatosplenomegaly BREAST:not examined LUNGS: expiratory wheezes bilaterally HEART: regular rate & rhythm, no murmurs and no gallops ABDOMEN:abdomen soft, non-tender, normal bowel sounds and no masses or organomegaly BACK: Back symmetric, no curvature.,  Range of motion is normal EXTREMITIES:no joint deformities, effusion, or inflammation, no skin discoloration  NEURO: alert & oriented x 3 with fluent speech, no focal motor/sensory deficits  PERFORMANCE STATUS: ECOG 1  LABORATORY DATA: Lab Results  Component Value Date   WBC 10.4 08/25/2018   HGB 8.8 (L) 08/25/2018   HCT 31.2 (L) 08/25/2018   MCV 68.1 (L) 08/25/2018   PLT 647 (H) 08/25/2018    @LASTCHEM @  RADIOGRAPHIC STUDIES: Dg Chest 2 View  Result Date: 08/22/2018 CLINICAL DATA:  Acute shortness of breath. Fatigue and nausea/vomiting. EXAM: CHEST - 2 VIEW COMPARISON:  08/06/2006 FINDINGS: RIGHT mediastinal and RIGHT hilar masses are now identified. A 1.5 cm nodular opacity overlying the UPPER LEFT lung noted. The visualized lungs are otherwise clear. No pleural effusion or pneumothorax. No acute bony abnormalities are identified. IMPRESSION: New RIGHT mediastinal and RIGHT hilar masses and possible LEFT  lung nodule. Chest CT with contrast recommended for further evaluation. Electronically Signed   By: Margarette Canada M.D.   On: 08/22/2018 12:31   Ct Angio Chest Pe W And/or Wo Contrast  Result Date: 08/22/2018 CLINICAL DATA:  Patient began feeling ill 5 days ago with nausea, vomiting and fatigue. Shortness of breath today. Masses noted on the current chest radiograph. EXAM: CT ANGIOGRAPHY CHEST WITH CONTRAST TECHNIQUE: Multidetector CT imaging of the chest was performed using the standard protocol during bolus administration of intravenous contrast. Multiplanar CT image reconstructions and MIPs were obtained to evaluate the vascular anatomy. CONTRAST:  195mL ISOVUE-370 IOPAMIDOL (ISOVUE-370) INJECTION 76% COMPARISON:  Current chest radiograph. FINDINGS: Cardiovascular: There is satisfactory opacification of the pulmonary arteries to the segmental level. There is no evidence of a pulmonary embolism. Large right upper lobe and confluent mediastinal and hilar mass occludes the right upper lobe pulmonary artery. It narrows the right lower lobe pulmonary artery. The mass also invades the superior vena cava. It abuts and slightly deforms the innominate artery. Mass partly insert goals the ascending aorta and proximal aortic arch. Heart is normal in size and configuration. Trace amount pericardial fluid. Mild three-vessel coronary artery calcifications. Thoracic aorta is normal in caliber. No dissection. Mild thoracic aortic atherosclerosis. Mediastinum/Nodes: Large right upper lobe mass is contiguous with a large mediastinal mass extends from the right superior mediastinum, follow-up in the anterior, middle and posterior mediastinum, extending inferiorly to just above the caval atrial junction. Mass narrows and invades the superior vena cava although the vessel is not excluded. Mass partly encases the innominate artery and right subclavian artery and abuts the right common carotid artery. Mass partly encases the ascending  thoracic aorta proximal arch. Mass also abuts the trachea displacing it to the left. Trachea is narrowed, greatest narrowing in the anterior to posterior dimension. It measures 1.5 x 0.5 mm in transverse dimensions at the level of the top of the aortic arch. There are shotty prominent mildly enlarged mediastinal lymph nodes the prevascular space. Extends to surround the right hilar structures as described under the cardiovascular section. Mass is contiguous with an enlarged subcarinal lymph node measures 3.2 cm in short axis. There are prominent neck base lymph nodes. Largest lies at the right neck base measuring 2.9 cm short axis. Lungs/Pleura: Trace bilateral pleural effusions. Large medial right upper lobe mass contiguous with the mediastinal mass and right hilar mass. The overall size of this mass in mediastinal and right hilar components measures approximately 14 x 10 x 13 cm. There are lung nodules. Largest lies in the left upper lobe centered on  image 77, series 9, measuring 15 x 13 mm. Nodule in the left lower lobe, image 109, measuring 6 mm. Nodule in the right upper lobe, image 100, measuring 6 mm. Elevation the right hemidiaphragm. Mild atelectasis lies in the right lower lobe above the elevated hemidiaphragm. There is mild atelectasis adjacent to the large right upper lobe mass. Mild centrilobular emphysema. No convincing pneumonia. No pulmonary edema. No pneumothorax. Upper Abdomen: No acute findings. No liver or adrenal masses noted on the included field of view. Musculoskeletal: No fracture or acute finding. No osteoblastic or osteolytic lesions. Review of the MIP images confirms the above findings. IMPRESSION: 1. No evidence of a pulmonary embolism. 2. Large mass consistent with malignancy is centered on the right upper mediastinum and medial right upper lobe. It extends to surround the right hilar structures. The mass invades and narrows the superior vena cava. It occludes the right upper lobe  pulmonary artery and narrows right lower lobe pulmonary artery. It abuts the aorta and innominate, right subclavian and right common carotid arteries. It also abuts and narrows esophagus. 3. Or is additional metastatic disease with metastatic adenopathy at the right neck base, small metastatic lung nodules and additional shotty mediastinal nodes that are presumed to be metastatic disease. 4. Trace pleural effusions. No convincing pulmonary edema. No pneumonia. Mild right lower lobe atelectasis. Aortic Atherosclerosis (ICD10-I70.0) and Emphysema (ICD10-J43.9). Electronically Signed   By: Lajean Manes M.D.   On: 08/22/2018 14:32    ASSESSMENT: This is a very pleasant 62 years old white male recently diagnosed with metastatic malignant melanoma presented with large mass in the right upper lobe as well as right hilum and mediastinal with SVC syndrome and compression of the trachea as well as the other mediastinal structures.  The patient also has bilateral pulmonary nodules.  This was diagnosed in September 2019.   PLAN: I had a lengthy discussion with the patient about his current condition and treatment options.  The final pathology was noted available at the time of his visit. I strongly recommend for the patient to continue with the palliative radiotherapy to the mediastinum to help relieve the compression of the superior vena cava and other major structures in the mediastinum. I will arrange for the patient to have a PET scan as well as MRI of the brain on outpatient basis to complete the staging work-up. The patient may benefit from treatment with immunotherapy and I would consider him for treatment with single agent Ketruda (pembrolizumab) after discharge. I will arrange for the patient a follow-up appointment with me at the [facility] within 1 week after his discharge for more detailed discussion of his treatment options. For smoking cessation I strongly encouraged the patient to quit  smoking. He was advised to call if he has any concerning symptoms in the interval.  The patient voices understanding of current disease status and treatment options and is in agreement with the current care plan.  All questions were answered. The patient knows to call the clinic with any problems, questions or concerns. We can certainly see the patient much sooner if necessary.  Thank you so much for allowing me to participate in the care of AEON KESSNER. I will continue to follow up the patient with you and assist in his care.  Disclaimer: This note was dictated with voice recognition software. Similar sounding words can inadvertently be transcribed and may not be corrected upon review.   Eilleen Kempf August 25, 2018, 8:38 AM

## 2018-08-26 ENCOUNTER — Ambulatory Visit
Admit: 2018-08-26 | Discharge: 2018-08-26 | Disposition: A | Discharge status: Home / Self Care | Payer: Medicare Other | Attending: Radiation Oncology | Admitting: Radiation Oncology

## 2018-08-26 MED ORDER — HYDROCODONE-ACETAMINOPHEN 5-325 MG PO TABS: 1.0000 | ORAL_TABLET | Freq: Four times a day (QID) | ORAL | 0 refills | Status: DC | PRN

## 2018-08-26 MED ORDER — BENZONATATE 100 MG PO CAPS: 100.0000 mg | ORAL_CAPSULE | Freq: Three times a day (TID) | ORAL | 0 refills | Status: DC | PRN

## 2018-08-26 MED ORDER — DOCUSATE SODIUM 100 MG PO CAPS: 100.0000 mg | ORAL_CAPSULE | Freq: Two times a day (BID) | ORAL | 0 refills | Status: DC

## 2018-08-26 MED ORDER — AMLODIPINE BESYLATE 5 MG PO TABS: 5.0000 mg | ORAL_TABLET | Freq: Every day | ORAL | 0 refills | Status: DC

## 2018-08-26 NOTE — Treatment Plan (Signed)
  Radiation Oncology         [phone]) [phone] ________________________________  Name: Gerald Adkins MRN: [id number]  Date: 08/22/2018  DOB: 25-Feb-1956  Weekly Radiation Therapy Management - InPatient  Diagnosis: melanoma    ICD-10-CM   1. Lung tumor D49.1   2. SOB (shortness of breath) R06.02      Current Dose: 9 Gy     Planned Dose:  30 Gy    Narrative . . . . . . . . The patient presents for routine under treatment assessment. 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 Pathology completed their evaluation on biopsy earlier this week and biopsy surprisingly shows melanoma. Given this suprise finding I feel the patient should proceed with additional radiation therapy to the chest and will plan on a total of 10 treatments at 3 gray per fraction for a cumulative dose of 30 gray. I discussed this issue with the patient earlier today and he is in agreement. Patient will meet with Dr. Julien Nordmann next week to see if he would be a potential therapy.Tissue has been sent out for BRAF assessment. The patient symptomatically is breathing better but still has difficulty with neck/facial swelling and breathing when lying flat. He is scheduled for discharge later today. Patient will proceed with radiation therapy as an outpatient tomorrow and resume on Monday after weekend break in treatment.                                  Physical Findings. . .  height is '6\' 1"'$  (1.854 m) and weight is 180 lb 6.4 oz (81.8 kg). His oral temperature is 98.4 F (36.9 C). His blood pressure is 139/85 and his pulse is 121 (abnormal). His respiration is 16 and oxygen saturation is 98%. .  Impression . . . . . . . The patient is tolerating radiation. No esophageal symptoms at this time. Plan . . . . . . . . . . . . Continue treatment as planned.  ________________________________   Blair Promise, PhD, MD

## 2018-08-26 NOTE — Discharge Summary (Addendum)
Physician Discharge Summary  Gerald Adkins VEL:[id number] DOB: 1956/02/08 DOA: 08/22/2018  PCP: [name], MD  Admit date: 08/22/2018 Discharge date: 08/26/2018  Admitted From: Home Disposition:  Home  Recommendations for Outpatient Follow-up:  1. Follow up with PCP in 1-2 weeks 2. Follow up with Oncology as scheduled 3. [place] reviewed. No recent controlled substances listed. Will provide limited rx for narcotics for cancer related pain  Discharge Condition:Stable CODE STATUS:Full Diet recommendation: Regular   Brief/Interim Summary: 62 year old male with past medical history significant for stroke, patellar fracture, hypertension and tobacco abuse presented to the emergency department complaining of facial swelling dyspnea. Upon ED evaluation thoracic CT was performed and showed large mediastinal mass which was found invading and narrowing the superior vena cava, occluding the right upper lobe pulmonary artery. Several older arteries in the esophagus. 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 Patient was transferred to [facility] long hospital from family medicine service to receive urgent radiotherapy.  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 Discussed with Oncology -Patient completed 3 rounds of radiation tx -Patient ambulated in hallway on room air, maintaining O2 sats over 90% -Per Oncology, OK to discharge today and follow up in office  Iron deficiency anemia -Malignancy also possibly affecting hemoglobin. -Patient given IV Feraheme. -Plan to transfuse if hemoglobin less than 7.  -Patient without signs of overt bleeding  Hypertension -Patient reportedly not taking blood pressure medications  prior to admission -Patient was started on amlodipine 5 mg   Chronic back pain -Remained stable, continue current pain management  Chronic nausea and vomiting -Patient uses PRN Zofran,this is likely related to marijuana abuse Continue Zofran as needed, would monitor QTC sporadically.   Discharge Diagnoses:  Active Problems:   SVC syndrome    Discharge Instructions   Allergies as of 08/26/2018      Reactions   Butterscotch Flavor Anaphylaxis, Shortness Of Breath      Medication List    STOP taking these medications   BC HEADACHE POWDER PO     TAKE these medications   amLODipine 5 MG tablet Commonly known as:  NORVASC Take 1 tablet (5 mg total) by mouth daily. Start taking on:  08/27/2018   benzonatate 100 MG capsule Commonly known as:  TESSALON Take 1 capsule (100 mg total) by mouth 3 (three) times daily as needed for cough.   docusate sodium 100 MG capsule Commonly known as:  COLACE Take 1 capsule (100 mg total) by mouth 2 (two) times daily.   HYDROcodone-acetaminophen 5-325 MG tablet Commonly known as:  NORCO/VICODIN Take 1-2 tablets by mouth every 6 (six) hours as needed for moderate pain or severe pain.       Allergies  Allergen Reactions  . Butterscotch Flavor Anaphylaxis and Shortness Of Breath    Consultations:  Oncology  Radiation Oncology  Procedures/Studies: Dg Chest 2 View  Result Date: 08/22/2018 CLINICAL DATA:  Acute shortness of breath. Fatigue and nausea/vomiting. EXAM: CHEST - 2 VIEW COMPARISON:  08/06/2006 FINDINGS: RIGHT mediastinal and RIGHT hilar masses are now identified. A 1.5 cm nodular opacity overlying the UPPER LEFT lung noted. The visualized lungs are otherwise clear. No pleural effusion or pneumothorax. No acute bony abnormalities are identified. IMPRESSION: New RIGHT mediastinal and RIGHT hilar masses and possible LEFT lung nodule. Chest CT  with contrast recommended for further evaluation. Electronically Signed   By:  Margarette Canada M.D.   On: 08/22/2018 12:31   Ct Angio Chest Pe W And/or Wo Contrast  Result Date: 08/22/2018 CLINICAL DATA:  Patient began feeling ill 5 days ago with nausea, vomiting and fatigue. Shortness of breath today. Masses noted on the current chest radiograph. EXAM: CT ANGIOGRAPHY CHEST WITH CONTRAST TECHNIQUE: Multidetector CT imaging of the chest was performed using the standard protocol during bolus administration of intravenous contrast. Multiplanar CT image reconstructions and MIPs were obtained to evaluate the vascular anatomy. CONTRAST:  146mL ISOVUE-370 IOPAMIDOL (ISOVUE-370) INJECTION 76% COMPARISON:  Current chest radiograph. FINDINGS: Cardiovascular: There is satisfactory opacification of the pulmonary arteries to the segmental level. There is no evidence of a pulmonary embolism. Large right upper lobe and confluent mediastinal and hilar mass occludes the right upper lobe pulmonary artery. It narrows the right lower lobe pulmonary artery. The mass also invades the superior vena cava. It abuts and slightly deforms the innominate artery. Mass partly insert goals the ascending aorta and proximal aortic arch. Heart is normal in size and configuration. Trace amount pericardial fluid. Mild three-vessel coronary artery calcifications. Thoracic aorta is normal in caliber. No dissection. Mild thoracic aortic atherosclerosis. Mediastinum/Nodes: Large right upper lobe mass is contiguous with a large mediastinal mass extends from the right superior mediastinum, follow-up in the anterior, middle and posterior mediastinum, extending inferiorly to just above the caval atrial junction. Mass narrows and invades the superior vena cava although the vessel is not excluded. Mass partly encases the innominate artery and right subclavian artery and abuts the right common carotid artery. Mass partly encases the ascending thoracic aorta proximal arch. Mass also abuts the trachea displacing it to the left. Trachea is  narrowed, greatest narrowing in the anterior to posterior dimension. It measures 1.5 x 0.5 mm in transverse dimensions at the level of the top of the aortic arch. There are shotty prominent mildly enlarged mediastinal lymph nodes the prevascular space. Extends to surround the right hilar structures as described under the cardiovascular section. Mass is contiguous with an enlarged subcarinal lymph node measures 3.2 cm in short axis. There are prominent neck base lymph nodes. Largest lies at the right neck base measuring 2.9 cm short axis. Lungs/Pleura: Trace bilateral pleural effusions. Large medial right upper lobe mass contiguous with the mediastinal mass and right hilar mass. The overall size of this mass in mediastinal and right hilar components measures approximately 14 x 10 x 13 cm. There are lung nodules. Largest lies in the left upper lobe centered on image 77, series 9, measuring 15 x 13 mm. Nodule in the left lower lobe, image 109, measuring 6 mm. Nodule in the right upper lobe, image 100, measuring 6 mm. Elevation the right hemidiaphragm. Mild atelectasis lies in the right lower lobe above the elevated hemidiaphragm. There is mild atelectasis adjacent to the large right upper lobe mass. Mild centrilobular emphysema. No convincing pneumonia. No pulmonary edema. No pneumothorax. Upper Abdomen: No acute findings. No liver or adrenal masses noted on the included field of view. Musculoskeletal: No fracture or acute finding. No osteoblastic or osteolytic lesions. Review of the MIP images confirms the above findings. IMPRESSION: 1. No evidence of a pulmonary embolism. 2. Large mass consistent with malignancy is centered on the right upper mediastinum and medial right upper lobe. It extends to surround the right hilar structures. The mass invades and narrows the superior vena cava. It occludes the right upper lobe pulmonary  artery and narrows right lower lobe pulmonary artery. It abuts the aorta and innominate,  right subclavian and right common carotid arteries. It also abuts and narrows esophagus. 3. Or is additional metastatic disease with metastatic adenopathy at the right neck base, small metastatic lung nodules and additional shotty mediastinal nodes that are presumed to be metastatic disease. 4. Trace pleural effusions. No convincing pulmonary edema. No pneumonia. Mild right lower lobe atelectasis. Aortic Atherosclerosis (ICD10-I70.0) and Emphysema (ICD10-J43.9). Electronically Signed   By: Lajean Manes M.D.   On: 08/22/2018 14:32    Subjective: Eager to go home  Discharge Exam: Vitals:   08/26/18 1353 08/26/18 1353  BP:    Pulse: (!) 118 (!) 121  Resp:    Temp:    SpO2: 99% 98%   Vitals:   08/26/18 0505 08/26/18 1224 08/26/18 1353 08/26/18 1353  BP: 128/80 139/85    Pulse: (!) 111 (!) 115 (!) 118 (!) 121  Resp: 16     Temp: 97.7 F (36.5 C) 98.4 F (36.9 C)    TempSrc: Oral Oral    SpO2: 95% (!) 78% 99% 98%  Weight:      Height:        General: Pt is alert, awake, not in acute distress Cardiovascular: RRR, S1/S2 +, no rubs, no gallops Respiratory: CTA bilaterally, no wheezing, no rhonchi Abdominal: Soft, NT, ND, bowel sounds + Extremities: no edema, no cyanosis   The results of significant diagnostics from this hospitalization (including imaging, microbiology, ancillary and laboratory) are listed below for reference.     Microbiology: Recent Results (from the past 240 hour(s))  Surgical pcr screen     Status: None   Collection Time: 08/22/18  8:07 PM  Result Value Ref Range Status   MRSA, PCR NEGATIVE NEGATIVE Final   Staphylococcus aureus NEGATIVE NEGATIVE Final    Comment: (NOTE) The Xpert SA Assay (FDA approved for NASAL specimens in patients 35 years of age and older), is one component of a comprehensive surveillance program. It is not intended to diagnose infection nor to guide or monitor treatment. Performed at [facility], [place] [address],  [place], [place] [address]      Labs: BNP (last 3 results) Recent Labs    08/22/18 1221  BNP 03.1   Basic Metabolic Panel: Recent Labs  Lab 08/22/18 1221 08/23/18 0958 08/25/18 0529  NA 134* 135 136  K 4.1 4.1 4.6  CL 99 99 97*  CO2 24 22 29   GLUCOSE 100* 130* 117*  BUN 8 8 16   CREATININE 0.62 0.80 0.66  CALCIUM 8.6* 8.7* 9.2  MG  --   --  2.1   Liver Function Tests: Recent Labs  Lab 08/23/18 0958  AST 19  ALT 12  ALKPHOS 65  BILITOT 0.5  PROT 6.5  ALBUMIN 2.7*   No results for input(s): LIPASE, AMYLASE in the last 168 hours. No results for input(s): AMMONIA in the last 168 hours. CBC: Recent Labs  Lab 08/22/18 1221 08/23/18 0958 08/25/18 0529  WBC 11.1* 10.9* 10.4  NEUTROABS  --  8.5*  --   HGB 8.3* 8.5* 8.8*  HCT 30.8* 30.6* 31.2*  MCV 70.2* 68.9* 68.1*  PLT 535* 528* 647*   Cardiac Enzymes: No results for input(s): CKTOTAL, CKMB, CKMBINDEX, TROPONINI in the last 168 hours. BNP: Invalid input(s): POCBNP CBG: No results for input(s): GLUCAP in the last 168 hours. D-Dimer No results for input(s): DDIMER in the last 72 hours. Hgb A1c No results  for input(s): HGBA1C in the last 72 hours. Lipid Profile No results for input(s): CHOL, HDL, LDLCALC, TRIG, CHOLHDL, LDLDIRECT in the last 72 hours. Thyroid function studies No results for input(s): TSH, T4TOTAL, T3FREE, THYROIDAB in the last 72 hours.  Invalid input(s): FREET3 Anemia work up No results for input(s): VITAMINB12, FOLATE, FERRITIN, TIBC, IRON, RETICCTPCT in the last 72 hours. Urinalysis    Component Value Date/Time   COLORURINE AMBER (A) 04/19/2013 1739   APPEARANCEUR HAZY (A) 04/19/2013 1739   LABSPEC 1.022 04/19/2013 1739   PHURINE 6.0 04/19/2013 1739   GLUCOSEU NEGATIVE 04/19/2013 1739   HGBUR NEGATIVE 04/19/2013 1739   BILIRUBINUR SMALL (A) 04/19/2013 1739   KETONESUR 15 (A) 04/19/2013 1739   PROTEINUR 30 (A) 04/19/2013 1739   UROBILINOGEN 1.0 04/19/2013 1739   NITRITE NEGATIVE  04/19/2013 1739   LEUKOCYTESUR NEGATIVE 04/19/2013 1739   Sepsis Labs Invalid input(s): PROCALCITONIN,  WBC,  LACTICIDVEN Microbiology Recent Results (from the past 240 hour(s))  Surgical pcr screen     Status: None   Collection Time: 08/22/18  8:07 PM  Result Value Ref Range Status   MRSA, PCR NEGATIVE NEGATIVE Final   Staphylococcus aureus NEGATIVE NEGATIVE Final    Comment: (NOTE) The Xpert SA Assay (FDA approved for NASAL specimens in patients 10 years of age and older), is one component of a comprehensive surveillance program. It is not intended to diagnose infection nor to guide or monitor treatment. Performed at [facility], [place] [address] [place], [place] [address]    Time spent: 30 min  SIGNED:   Marylu Lund, MD  [facility] 08/26/2018, 2:03 PM  If 7PM-7AM, please contact night-coverage

## 2018-08-27 ENCOUNTER — Ambulatory Visit
Admission: RE | Admit: 2018-08-27 | Discharge: 2018-08-27 | Disposition: A | Discharge status: Home / Self Care | Payer: Medicare Other | Source: Ambulatory Visit | Attending: Radiation Oncology | Admitting: Radiation Oncology

## 2018-08-27 ENCOUNTER — Telehealth: Payer: Self-pay | Admitting: Radiation Oncology

## 2018-08-27 NOTE — Telephone Encounter (Signed)
Patient presented in the lobby saying that he no longer had transportation to his appointments. Wilma came and got me and I was able to screen him and get him signed up for the program. Waiver is now on file and he is set up for future rides.

## 2018-08-28 ENCOUNTER — Other Ambulatory Visit: Payer: Self-pay | Admitting: Internal Medicine

## 2018-08-28 DIAGNOSIS — C7801 Secondary malignant neoplasm of right lung: Secondary | ICD-10-CM

## 2018-08-28 DIAGNOSIS — I871 Compression of vein: Secondary | ICD-10-CM

## 2018-08-30 ENCOUNTER — Emergency Department (HOSPITAL_COMMUNITY): Payer: Medicare Other

## 2018-08-30 ENCOUNTER — Other Ambulatory Visit: Payer: Self-pay

## 2018-08-30 ENCOUNTER — Inpatient Hospital Stay (HOSPITAL_COMMUNITY)
Admission: EM | Admit: 2018-08-30 | Discharge: 2018-09-05 | DRG: 181 | Disposition: A | Discharge status: Home Health | Payer: Medicare Other | Attending: Internal Medicine | Admitting: Internal Medicine

## 2018-08-30 ENCOUNTER — Encounter (HOSPITAL_COMMUNITY): Payer: Self-pay

## 2018-08-30 ENCOUNTER — Ambulatory Visit
Admission: RE | Admit: 2018-08-30 | Discharge: 2018-08-30 | Disposition: A | Discharge status: Home / Self Care | Payer: Medicare Other | Source: Ambulatory Visit | Attending: Radiation Oncology | Admitting: Radiation Oncology

## 2018-08-30 ENCOUNTER — Telehealth: Payer: Self-pay | Admitting: Internal Medicine

## 2018-08-30 DIAGNOSIS — C439 Malignant melanoma of skin, unspecified: Secondary | ICD-10-CM | POA: Diagnosis not present

## 2018-08-30 DIAGNOSIS — Z515 Encounter for palliative care: Secondary | ICD-10-CM | POA: Diagnosis not present

## 2018-08-30 DIAGNOSIS — D509 Iron deficiency anemia, unspecified: Secondary | ICD-10-CM | POA: Diagnosis present

## 2018-08-30 DIAGNOSIS — Z9102 Food additives allergy status: Secondary | ICD-10-CM | POA: Diagnosis not present

## 2018-08-30 DIAGNOSIS — C434 Malignant melanoma of scalp and neck: Secondary | ICD-10-CM | POA: Diagnosis present

## 2018-08-30 DIAGNOSIS — M545 Low back pain: Secondary | ICD-10-CM | POA: Diagnosis present

## 2018-08-30 DIAGNOSIS — Z9981 Dependence on supplemental oxygen: Secondary | ICD-10-CM | POA: Diagnosis not present

## 2018-08-30 DIAGNOSIS — C3411 Malignant neoplasm of upper lobe, right bronchus or lung: Secondary | ICD-10-CM | POA: Diagnosis present

## 2018-08-30 DIAGNOSIS — R918 Other nonspecific abnormal finding of lung field: Secondary | ICD-10-CM | POA: Diagnosis present

## 2018-08-30 DIAGNOSIS — I871 Compression of vein: Secondary | ICD-10-CM | POA: Diagnosis present

## 2018-08-30 DIAGNOSIS — R0600 Dyspnea, unspecified: Secondary | ICD-10-CM | POA: Diagnosis present

## 2018-08-30 DIAGNOSIS — R059 Cough, unspecified: Secondary | ICD-10-CM | POA: Diagnosis present

## 2018-08-30 DIAGNOSIS — C383 Malignant neoplasm of mediastinum, part unspecified: Secondary | ICD-10-CM | POA: Diagnosis present

## 2018-08-30 DIAGNOSIS — R000 Tachycardia, unspecified: Secondary | ICD-10-CM | POA: Diagnosis present

## 2018-08-30 DIAGNOSIS — C3412 Malignant neoplasm of upper lobe, left bronchus or lung: Secondary | ICD-10-CM | POA: Diagnosis present

## 2018-08-30 DIAGNOSIS — Z801 Family history of malignant neoplasm of trachea, bronchus and lung: Secondary | ICD-10-CM

## 2018-08-30 DIAGNOSIS — Z7189 Other specified counseling: Secondary | ICD-10-CM

## 2018-08-30 DIAGNOSIS — Z803 Family history of malignant neoplasm of breast: Secondary | ICD-10-CM

## 2018-08-30 DIAGNOSIS — M549 Dorsalgia, unspecified: Secondary | ICD-10-CM

## 2018-08-30 DIAGNOSIS — J9859 Other diseases of mediastinum, not elsewhere classified: Secondary | ICD-10-CM

## 2018-08-30 DIAGNOSIS — M21942 Unspecified acquired deformity of hand, left hand: Secondary | ICD-10-CM | POA: Diagnosis present

## 2018-08-30 DIAGNOSIS — F5102 Adjustment insomnia: Secondary | ICD-10-CM

## 2018-08-30 DIAGNOSIS — E785 Hyperlipidemia, unspecified: Secondary | ICD-10-CM | POA: Diagnosis present

## 2018-08-30 DIAGNOSIS — R0602 Shortness of breath: Secondary | ICD-10-CM | POA: Diagnosis not present

## 2018-08-30 DIAGNOSIS — Z8673 Personal history of transient ischemic attack (TIA), and cerebral infarction without residual deficits: Secondary | ICD-10-CM | POA: Diagnosis not present

## 2018-08-30 DIAGNOSIS — G8921 Chronic pain due to trauma: Secondary | ICD-10-CM | POA: Diagnosis present

## 2018-08-30 DIAGNOSIS — G8929 Other chronic pain: Secondary | ICD-10-CM | POA: Diagnosis present

## 2018-08-30 DIAGNOSIS — Z800 Family history of malignant neoplasm of digestive organs: Secondary | ICD-10-CM | POA: Diagnosis not present

## 2018-08-30 DIAGNOSIS — E871 Hypo-osmolality and hyponatremia: Secondary | ICD-10-CM | POA: Diagnosis not present

## 2018-08-30 DIAGNOSIS — Z87891 Personal history of nicotine dependence: Secondary | ICD-10-CM

## 2018-08-30 DIAGNOSIS — Z9049 Acquired absence of other specified parts of digestive tract: Secondary | ICD-10-CM

## 2018-08-30 DIAGNOSIS — I10 Essential (primary) hypertension: Secondary | ICD-10-CM | POA: Diagnosis present

## 2018-08-30 DIAGNOSIS — R05 Cough: Secondary | ICD-10-CM | POA: Diagnosis present

## 2018-08-30 DIAGNOSIS — C799 Secondary malignant neoplasm of unspecified site: Secondary | ICD-10-CM | POA: Diagnosis present

## 2018-08-30 LAB — COMPREHENSIVE METABOLIC PANEL
ALBUMIN: 3.1 g/dL — AB (ref 3.5–5.0)
ALK PHOS: 64 U/L (ref 38–126)
ALT: 17 U/L (ref 0–44)
AST: 21 U/L (ref 15–41)
Anion gap: 13 (ref 5–15)
BILIRUBIN TOTAL: 0.6 mg/dL (ref 0.3–1.2)
BUN: 13 mg/dL (ref 6–20)
CO2: 27 mmol/L (ref 22–32)
Calcium: 8.6 mg/dL — ABNORMAL LOW (ref 8.9–10.3)
Chloride: 92 mmol/L — ABNORMAL LOW (ref 98–111)
Creatinine, Ser: 0.56 mg/dL — ABNORMAL LOW (ref 0.61–1.24)
GFR calc non Af Amer: >60 mL/min (ref >60)
Glucose, Bld: 102 mg/dL — ABNORMAL HIGH (ref 70–99)
Potassium: 3.9 mmol/L (ref 3.5–5.1)
Sodium: 132 mmol/L — ABNORMAL LOW (ref 135–145)
Total Protein: 6.6 g/dL (ref 6.5–8.1)

## 2018-08-30 LAB — CBC WITH DIFFERENTIAL/PLATELET
BASOS PCT: 0 %
Basophils Absolute: 0 10*3/uL (ref 0.0–0.1)
Eosinophils Absolute: 0 10*3/uL (ref 0.0–0.7)
Eosinophils Relative: 0 %
HEMATOCRIT: 30.5 % — AB (ref 39.0–52.0)
HEMOGLOBIN: 9.1 g/dL — AB (ref 13.0–17.0)
Lymphocytes Relative: 6 %
Lymphs Abs: 0.6 10*3/uL — ABNORMAL LOW (ref 0.7–4.0)
MCH: 20.5 pg — AB (ref 26.0–34.0)
MCHC: 29.8 g/dL — AB (ref 30.0–36.0)
MCV: 68.8 fL — ABNORMAL LOW (ref 78.0–100.0)
MONOS PCT: 6 %
Monocytes Absolute: 0.6 10*3/uL (ref 0.1–1.0)
NEUTROS ABS: 9.5 10*3/uL — AB (ref 1.7–7.7)
NEUTROS PCT: 88 %
Platelets: 435 10*3/uL — ABNORMAL HIGH (ref 150–400)
RBC: 4.43 MIL/uL (ref 4.22–5.81)
RDW: 19.9 % — ABNORMAL HIGH (ref 11.5–15.5)
WBC: 10.7 10*3/uL — ABNORMAL HIGH (ref 4.0–10.5)

## 2018-08-30 LAB — I-STAT CG4 LACTIC ACID, ED
LACTIC ACID, VENOUS: 2.9 mmol/L — AB (ref 0.5–1.9)
Lactic Acid, Venous: 2.89 mmol/L (ref 0.5–1.9)

## 2018-08-30 LAB — BRAIN NATRIURETIC PEPTIDE: B Natriuretic Peptide: 31.2 pg/mL (ref 0.0–100.0)

## 2018-08-30 LAB — TROPONIN I

## 2018-08-30 MED ORDER — MORPHINE SULFATE (PF) 4 MG/ML IV SOLN
4.0000 mg | Freq: Once | INTRAVENOUS | Status: AC
  Administered 2018-08-30: 4 mg via INTRAVENOUS
  Filled 2018-08-30: qty 1

## 2018-08-30 MED ORDER — ACETAMINOPHEN 650 MG RE SUPP: 650.0000 mg | Freq: Four times a day (QID) | RECTAL | Status: DC | PRN

## 2018-08-30 MED ORDER — ACETAMINOPHEN 325 MG PO TABS
650.0000 mg | ORAL_TABLET | Freq: Four times a day (QID) | ORAL | Status: DC | PRN
  Administered 2018-09-03 – 2018-09-05 (×2): 650 mg via ORAL
  Filled 2018-08-30 (×2): qty 2

## 2018-08-30 MED ORDER — MORPHINE SULFATE (PF) 2 MG/ML IV SOLN
2.0000 mg | INTRAVENOUS | Status: DC | PRN
  Administered 2018-08-31 – 2018-09-01 (×6): 2 mg via INTRAVENOUS
  Filled 2018-08-30 (×6): qty 1

## 2018-08-30 MED ORDER — ONDANSETRON HCL 4 MG/2ML IJ SOLN
4.0000 mg | Freq: Four times a day (QID) | INTRAMUSCULAR | Status: DC | PRN
  Administered 2018-08-31 (×2): 4 mg via INTRAVENOUS
  Filled 2018-08-30 (×2): qty 2

## 2018-08-30 MED ORDER — LACTATED RINGERS IV BOLUS
1000.0000 mL | Freq: Once | INTRAVENOUS | Status: AC
  Administered 2018-08-30: 1000 mL via INTRAVENOUS

## 2018-08-30 MED ORDER — ONDANSETRON HCL 4 MG PO TABS: 4.0000 mg | ORAL_TABLET | Freq: Four times a day (QID) | ORAL | Status: DC | PRN

## 2018-08-30 MED ORDER — SODIUM CHLORIDE 0.9 % IV SOLN
500.0000 mg | Freq: Once | INTRAVENOUS | Status: AC
  Administered 2018-08-30: 500 mg via INTRAVENOUS
  Filled 2018-08-30: qty 500

## 2018-08-30 MED ORDER — METHYLPREDNISOLONE SODIUM SUCC 125 MG IJ SOLR
80.0000 mg | Freq: Two times a day (BID) | INTRAMUSCULAR | Status: DC
  Administered 2018-08-31 – 2018-09-04 (×9): 80 mg via INTRAVENOUS
  Filled 2018-08-30 (×9): qty 2

## 2018-08-30 MED ORDER — POLYETHYLENE GLYCOL 3350 17 G PO PACK: 17.0000 g | PACK | Freq: Every day | ORAL | Status: DC | PRN

## 2018-08-30 MED ORDER — ALBUTEROL SULFATE (2.5 MG/3ML) 0.083% IN NEBU: 5.0000 mg | INHALATION_SOLUTION | Freq: Once | RESPIRATORY_TRACT | Status: DC

## 2018-08-30 MED ORDER — METHYLPREDNISOLONE SODIUM SUCC 125 MG IJ SOLR
125.0000 mg | INTRAMUSCULAR | Status: AC
  Administered 2018-08-30: 125 mg via INTRAVENOUS
  Filled 2018-08-30: qty 2

## 2018-08-30 MED ORDER — IOPAMIDOL (ISOVUE-370) INJECTION 76%
100.0000 mL | Freq: Once | INTRAVENOUS | Status: AC | PRN
  Administered 2018-08-30: 100 mL via INTRAVENOUS

## 2018-08-30 MED ORDER — SODIUM CHLORIDE 0.9 % IV SOLN
1.0000 g | Freq: Once | INTRAVENOUS | Status: AC
  Administered 2018-08-30: 1 g via INTRAVENOUS
  Filled 2018-08-30: qty 10

## 2018-08-30 NOTE — H&P (Addendum)
History and Physical    Gerald Adkins WJX:[id number] DOB: 20-Jun-1956 DOA: 08/30/2018  PCP: [name], MD   Patient coming from: Home  Chief Complaint: Shortness of breath  HPI: Gerald Adkins is a 62 y.o. 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  Patient reports when he is lying back or just reclining difficulty breathing becomes so severe he feels he is choking.  He is not on home O2.  Patient tells me he had some lower extremity swelling earlier in the week which has resolved today, he has improving but persistent swelling in his right upper extremity.  He denies fever or chills.  Reports coughing only when he lies flat, productive of whitish sputum.  Chronic right upper chest pain.  Reports chronic lower back pain.  Recent hospital admission  9/22- 08/26/18-patient presented with facial swelling, thoracic CT showed large mediastinal mass invading and narrowing the superior vena cava.  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  Patient completed 3 rounds of radiation treatment.  He was discharged to home, with O2 sats greater than 90% on room air.   Patient presented for radiation treatment today, was unable to lay flat for radiation treatment, patient became tachycardic and tachypneic, was placed on 2 L O2, and sent to [facility] long ED.  ED Course: Persistent tachycardia mostly 110s.  Intermittent tachypnea to 31, O2 sats greater than 97% on nasal cannula.  Two-view chest x-ray showed new right lung base opacity most likely atelectasis, consider pneumonia ED a consistent clinical findings.  Patient was started on IV ceftriaxone and azithromycin in the ED hospitalist was called to admit.  Review of Systems: As per HPI other systems reviewed and negative  Past Medical History:    Diagnosis Date  . Chronic back pain 1989   broke back  . Hyperlipidemia   . Hypertension   . Opiate addiction ([facility])    remote  . Stroke [facility])     Past Surgical History:  Procedure Laterality Date  . APPENDECTOMY    . BACK SURGERY     lumbar spine, cut sciatic nerve  . CHOLECYSTECTOMY    . SCALENE NODE BIOPSY Right 08/23/2018   Procedure: BIOPSY OF RIGHT SCALENE LYMPH NODE;  Surgeon: Grace Isaac, MD;  Location: [facility];  Service: Thoracic;  Laterality: Right;     reports that he quit smoking about 20 years ago. He has a 49.50 pack-year smoking history. He has never used smokeless tobacco. He reports that he drinks alcohol. He reports that he has current or past drug history. Drug: Marijuana. Frequency: 3.00 times per week.  Allergies  Allergen Reactions  . Butterscotch Flavor Anaphylaxis and Shortness Of Breath    Family History  Problem Relation Age of Onset  . Breast cancer Mother        multiple types  . Lung cancer Maternal Aunt        nonsmoking  . Pancreatic cancer Maternal Aunt     Prior to Admission medications   Medication Sig Start Date End Date Taking? Authorizing Provider  amLODipine (NORVASC) 5 MG tablet Take 1 tablet (5 mg total) by mouth daily. 08/27/18 09/26/18 Yes Donne Hazel, MD  benzonatate (TESSALON) 100 MG capsule Take 1 capsule (100 mg total) by mouth 3 (three) times daily as needed for cough. 08/26/18  Yes Donne Hazel, MD  HYDROcodone-acetaminophen (NORCO/VICODIN) 5-325 MG tablet Take 1-2 tablets by mouth every 6 (six) hours as needed for moderate pain or severe pain. 08/26/18  Yes Donne Hazel, MD  Menthol 1.7 MG LOZG Use as directed 1 lozenge in the mouth or throat daily as needed (cough).   Yes [provider]  docusate sodium (COLACE) 100 MG capsule Take 1 capsule (100 mg total) by mouth 2 (two) times daily. Patient not taking: Reported on 08/30/2018 08/26/18 09/25/18  Donne Hazel, MD    Physical Exam: Vitals:    08/30/18 1845 08/30/18 1900 08/30/18 1915 08/30/18 1930  BP: (!) 140/109 116/76 112/70 108/67  Pulse: (!) 139 (!) 118 (!) 114 (!) 116  Resp: (!) 30 17 (!) 22 (!) 29  Temp:      TempSrc:      SpO2: 97% 100% 100% 100%    Constitutional: NAD, calm, comfortable, sitting forward with his arms on the table Vitals:   08/30/18 1845 08/30/18 1900 08/30/18 1915 08/30/18 1930  BP: (!) 140/109 116/76 112/70 108/67  Pulse: (!) 139 (!) 118 (!) 114 (!) 116  Resp: (!) 30 17 (!) 22 (!) 29  Temp:      TempSrc:      SpO2: 97% 100% 100% 100%   Eyes: PERRL, lids and conjunctivae normal ENMT: Mucous membranes are dry Posterior pharynx clear of any exudate or lesions. Neck: normal, supple, no masses, no thyromegaly Respiratory: clear to auscultation bilaterally, no wheezing, no crackles. Normal respiratory effort. No accessory muscle use. No stridor. Cardiovascular: Tachycradic but Regular rate and rhythm, no murmurs / rubs / gallops. No lower extremity edema, Right UE bigger than left. 2+ pedal pulses. No carotid bruits.  Abdomen: no tenderness, no masses palpated. No hepatosplenomegaly. Bowel sounds positive.  Musculoskeletal: no clubbing / cyanosis. Congenital deforrmity left hand, Good ROM, no contractures. Normal muscle tone.  Skin: no rashes, lesions, ulcers. No induration Neurologic: CN 2-12 grossly intact. Strength 5/5 in all 4.  Psychiatric: Normal judgment and insight. Alert and oriented x 3. Normal mood.   Labs on Admission: I have personally reviewed following labs and imaging studies  CBC: Recent Labs  Lab 08/25/18 0529 08/30/18 1721  WBC 10.4 10.7*  NEUTROABS  --  9.5*  HGB 8.8* 9.1*  HCT 31.2* 30.5*  MCV 68.1* 68.8*  PLT 647* 063*   Basic Metabolic Panel: Recent Labs  Lab 08/25/18 0529 08/30/18 1721  NA 136 132*  K 4.6 3.9  CL 97* 92*  CO2 29 27  GLUCOSE 117* 102*  BUN 16 13  CREATININE 0.66 0.56*  CALCIUM 9.2 8.6*  MG 2.1  --    Liver Function Tests: Recent Labs    Lab 08/30/18 1721  AST 21  ALT 17  ALKPHOS 64  BILITOT 0.6  PROT 6.6  ALBUMIN 3.1*   Cardiac Enzymes: Recent Labs  Lab 08/30/18 1721  TROPONINI <0.03   Urine analysis:    Component Value Date/Time   COLORURINE AMBER (A) 04/19/2013 1739   APPEARANCEUR HAZY (A) 04/19/2013 1739   LABSPEC 1.022 04/19/2013 1739   PHURINE 6.0 04/19/2013 1739   GLUCOSEU NEGATIVE 04/19/2013 1739   HGBUR NEGATIVE 04/19/2013 1739   BILIRUBINUR SMALL (A) 04/19/2013 1739   KETONESUR 15 (A) 04/19/2013 1739   PROTEINUR 30 (A) 04/19/2013 1739   UROBILINOGEN 1.0 04/19/2013 1739   NITRITE NEGATIVE 04/19/2013 1739   LEUKOCYTESUR NEGATIVE 04/19/2013 1739    Radiological Exams on Admission: Dg Chest 2 View  Result Date: 08/30/2018 CLINICAL DATA:  Patient has been diagnosed recently with melanoma, and has been receiving radiation treatments. Patient was supposed to get radiation today, but patient could not lay flat due to SOB. Patient tachycardic and tachypnic in triage with tripoding EXAM: CHEST - 2 VIEW COMPARISON:  Chest CT and chest radiographs, 08/22/2018. FINDINGS: Large right mediastinal at contiguous right upper lobe mass, contiguous right hilar mass and smaller nodule above the left hilar structures in the left upper lobe are stable from the prior exams. There is opacity at the right lung base that is new from the prior chest radiograph and chest CT, likely atelectasis. Pneumonia is possible. Remainder of the lungs is clear. Cardiac silhouette is normal in size. No pleural effusion or pneumothorax. Skeletal structures are intact. IMPRESSION: 1. New right lung base opacity, most likely atelectasis. Consider pneumonia if there are consistent clinical findings. 2. No other change from the prior chest CT and chest radiograph. Large right mediastinal and contiguous right hilar and upper lobe mass consistent with carcinoma. Presumed metastatic lung nodule in the central left upper lobe. Electronically Signed    By: Lajean Manes M.D.   On: 08/30/2018 16:48    EKG: Independently reviewed.  Sinus tachycardia.  QTC 462  Assessment/Plan Principal Problem:   Dyspnea Active Problems:   SVC syndrome   Dyspnea-mostly positional. WBC 10.7.  Tachycardic tachypneic. Afebrile.  Cough only when lying down.  Two-view chest x-ray likley atelectasis , ?pneumonia. -  Stat CTA chest-similar sized large mediastinal mass with significant narrowing of right lower lobe pulmonary artery. NO embolus.  Bulky mediastinal and hilar adenopathy, marked mass-effect on lower trachea by mediastinal mass. - Called CTS, Dr. Prescott Gum considering size of the trachea-recommended IV steroids, that the radiation treatments may be causing edema and talk to PCCM that patient may need intubation. PCCM- Dr. Lynetta Mare recommends step down, and if patient comfortable, no stridor and only on 2L, no need for intubation. To call patient urgently needs to be intubated. - NPO midnight -Oncology and radiation oncology consultation in a.m. 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  Biopsy of soft tissue mass from the neck- pathology- melanoma. -Oncology and radiation oncology consultation in a.m.  Chronic back pain- -morphine  DVT prophylaxis: Scds Code Status: Full Family Communication: None at bedside Disposition Plan: Per rounding team Consults called: Radiation oncology and oncology consultation in a.m Admission status: Inpatient as he is requiring stepdown level of care.   Bethena Roys MD [facility] Pager [phone] From 6PM-2AM.  Otherwise please contact night-coverage [email or web address] Password [email or web address]  08/30/2018, 10:41 PM

## 2018-08-30 NOTE — ED Notes (Signed)
Hospitalist at bedside. Will infuse the other antibiotic once his admission assessment from the physician is complete.

## 2018-08-30 NOTE — Telephone Encounter (Signed)
Scheduled appt per 9/28 sch message - pt is aware of appt date and time and central radiology to contact them with appt date and time.

## 2018-08-30 NOTE — ED Notes (Signed)
Patient BIB Cancer center staff. Report received from [facility]. Patient has been diagnosed recently with melanoma, and has been receiving radiation treatments. Patient was supposed to get radiation today, but patient could not lay flat due to SOB. Patient tachycardic and tachypnic in triage with tripoding. Patient on [facility] continuously.

## 2018-08-30 NOTE — Progress Notes (Signed)
At the request of Dr. Sondra Come, pt was taken to [facility] ED for further evaluation and treatment. Pt transported to ED in [facility] on 2L [place]. Pt contacted "wife" via personal cell phone during transport to ED to convey that "they were putting me back in here". Report given to [facility], Therapist, sports. Loma Sousa, RN BSN

## 2018-08-30 NOTE — ED Notes (Signed)
Bed: ML54 Expected date:  Expected time:  Means of arrival:  Comments: Hold: [place]

## 2018-08-30 NOTE — ED Notes (Signed)
Per [place], patient has been receiving radiation for myeloma to the chest. When he layes back, he is experiencing pain. Maybe compressing his bronchioles. HR's in 120's. Hgb: 8's. Patient is tripoding.

## 2018-08-30 NOTE — ED Notes (Signed)
Patients bed changed to step down.

## 2018-08-30 NOTE — ED Provider Notes (Addendum)
Emergency Department Provider Note   I have reviewed the triage vital signs and the nursing notes.   HISTORY  Chief Complaint Shortness of Breath   HPI Gerald Adkins is a 62 y.o. 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  Since that time he had a better appetite but he said aggressive worsening of his dyspnea specifically orthopnea.  Also a lot of cough.  No fevers at home.  Was not sent home on oxygen.  Went to get his first radiation treatment today and currently flat without significant tachycardia and tachypnea and inability to tolerate it.  Patient was sent here for further evaluation.  Patient states is the same that he has been whole time.  He also notes that his legs are more swollen than they normally are.  He has no history of heart failure that he knows of. No other associated or modifying symptoms.    Past Medical History:  Diagnosis Date  . Chronic back pain 1989   broke back  . Hyperlipidemia   . Hypertension   . Opiate addiction ([place])    remote  . Stroke [facility])     Patient Active Problem List   Diagnosis Date Noted  . Dyspnea 08/30/2018  . 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    Past Surgical History:  Procedure Laterality Date  . APPENDECTOMY    . BACK SURGERY     lumbar spine, cut sciatic nerve  . CHOLECYSTECTOMY    . SCALENE NODE BIOPSY Right 08/23/2018   Procedure: BIOPSY OF RIGHT SCALENE LYMPH NODE;  Surgeon: Grace Isaac, MD;  Location: [facility];  Service: Thoracic;  Laterality: Right;      Allergies Butterscotch flavor  Family History  Problem Relation Age of Onset  . Breast cancer Mother        multiple types  . Lung cancer Maternal Aunt        nonsmoking  . Pancreatic cancer Maternal Aunt     Social History Social History   Tobacco Use  . Smoking status: Former Smoker     Packs/day: 1.50    Years: 33.00    Pack years: 49.50    Last attempt to quit: 1999    Years since quitting: 20.7  . Smokeless tobacco: Never Used  Substance Use Topics  . Alcohol use: Yes  . Drug use: Yes    Frequency: 3.0 times per week    Types: Marijuana    Review of Systems  All other systems negative except as documented in the HPI. All pertinent positives and negatives as reviewed in the HPI. ____________________________________________   PHYSICAL EXAM:  VITAL SIGNS: ED Triage Vitals  Enc Vitals Group     BP 08/30/18 1615 125/81     Pulse Rate 08/30/18 1615 (!) 110     Resp 08/30/18 1615 16     Temp 08/30/18 1615 97.8 F (36.6 C)     Temp Source 08/30/18 1615 Oral     SpO2 08/30/18 1613 97 %    Constitutional: Alert and oriented. Well appearing and in no acute distress. Eyes: Conjunctivae are normal. PERRL. EOMI. Head: Atraumatic. Nose: No congestion/rhinnorhea. Mouth/Throat: Mucous membranes are moist.  Oropharynx non-erythematous. Neck: No stridor.  No meningeal signs.   Cardiovascular: Normal rate, regular rhythm. Good peripheral circulation. Grossly normal heart sounds.   Respiratory: Normal respiratory effort.  No retractions.  Lungs diminished bilaterally. Gastrointestinal: Soft and nontender. No distention.  Musculoskeletal: No lower extremity tenderness but does have bilateral mild edema. No gross deformities of extremities. Neurologic:  Normal speech and language. No gross focal neurologic deficits are appreciated.  Skin:  Skin is warm, dry and intact. No rash noted.   ____________________________________________   LABS (all labs ordered are listed, but only abnormal results are displayed)  Labs Reviewed  CBC WITH DIFFERENTIAL/PLATELET - Abnormal; Notable for the following components:      Result Value   WBC 10.7 (*)    Hemoglobin 9.1 (*)    HCT 30.5 (*)    MCV 68.8 (*)    MCH 20.5 (*)    MCHC 29.8 (*)    RDW 19.9 (*)    Platelets 435  (*)    Neutro Abs 9.5 (*)    Lymphs Abs 0.6 (*)    All other components within normal limits  COMPREHENSIVE METABOLIC PANEL - Abnormal; Notable for the following components:   Sodium 132 (*)    Chloride 92 (*)    Glucose, Bld 102 (*)    Creatinine, Ser 0.56 (*)    Calcium 8.6 (*)    Albumin 3.1 (*)    All other components within normal limits  I-STAT CG4 LACTIC ACID, ED - Abnormal; Notable for the following components:   Lactic Acid, Venous 2.89 (*)    All other components within normal limits  I-STAT CG4 LACTIC ACID, ED - Abnormal; Notable for the following components:   Lactic Acid, Venous 2.90 (*)    All other components within normal limits  MRSA PCR SCREENING  TROPONIN I  BRAIN NATRIURETIC PEPTIDE   ____________________________________________  EKG   EKG Interpretation  Date/Time:  Monday August 30 2018 16:10:06 EDT Ventricular Rate:  112 PR Interval:    QRS Duration: 98 QT Interval:  338 QTC Calculation: 462 R Axis:   94 Text Interpretation:  Sinus tachycardia Right axis deviation Probable anteroseptal infarct, old No significant change since last tracing Confirmed by Merrily Pew [phone]) on 08/31/2018 12:09:42 AM       ____________________________________________  RADIOLOGY  Dg Chest 2 View  Result Date: 08/30/2018 CLINICAL DATA:  Patient has been diagnosed recently with melanoma, and has been receiving radiation treatments. Patient was supposed to get radiation today, but patient could not lay flat due to SOB. Patient tachycardic and tachypnic in triage with tripoding EXAM: CHEST - 2 VIEW COMPARISON:  Chest CT and chest radiographs, 08/22/2018. FINDINGS: Large right mediastinal at contiguous right upper lobe mass, contiguous right hilar mass and smaller nodule above the left hilar structures in the left upper lobe are stable from the prior exams. There is opacity at the right lung base that is new from the prior chest radiograph and chest CT, likely  atelectasis. Pneumonia is possible. Remainder of the lungs is clear. Cardiac silhouette is normal in size. No pleural effusion or pneumothorax. Skeletal structures are intact. IMPRESSION: 1. New right lung base opacity, most likely atelectasis. Consider pneumonia if there are consistent clinical findings. 2. No other change from the prior chest CT and chest radiograph. Large right mediastinal and contiguous right hilar and upper lobe mass consistent with carcinoma. Presumed metastatic lung nodule in the central left upper lobe. Electronically Signed   By: Lajean Manes M.D.   On: 08/30/2018 16:48   Ct Angio Chest Pe W Or Wo Contrast  Result Date: 08/30/2018 CLINICAL DATA:  History of melanoma and lung mass EXAM: CT ANGIOGRAPHY CHEST  WITH CONTRAST TECHNIQUE: Multidetector CT imaging of the chest was performed using the standard protocol during bolus administration of intravenous contrast. Multiplanar CT image reconstructions and MIPs were obtained to evaluate the vascular anatomy. CONTRAST:  145mL ISOVUE-370 IOPAMIDOL (ISOVUE-370) INJECTION 76% COMPARISON:  Chest x-ray 08/30/2018, CT 08/22/2018 FINDINGS: Cardiovascular: Satisfactory opacification of the pulmonary arteries to the segmental level. There is abrupt occlusion of the right upper lobe pulmonary artery by large mass lesion. The visible portions of enhancing right lower lobe pulmonary artery demonstrate no filling defects. No filling defect within the left-sided vasculature. There is significant narrowing of the right lower lobe pulmonary artery by mediastinal mass lesion and hilar mass. Coronary artery calcification. Normal heart size. Small pericardial effusion. Non enhancement of right brachiocephalic vein which is presumably occluded by tumor. There is narrowing of the left brachiocephalic vessel and subsequent occlusion of the left brachiocephalic vessel proximal to the confluence. There is narrowing of the superior vena cava with occluded appearance  of the upper aspect of the vena cava. Interim finding of extensive collateral vessels within the left chest wall. Best seen on coronal views are filling defects within the left subclavian, distal jugular, and brachiocephalic vessels, concerning for thrombus. Aortic atherosclerosis. No aneurysm. Tumor within the mediastinum partially surrounds the ascending aorta and proximal arch. Mediastinum/Nodes: Large mediastinal mass measuring 13.1 x 10.5 by 10.7 cm, grossly unchanged in size as compared with recent CT. Multiple enlarged AP window lymph nodes. Bulky subcarinal lymph node measuring 28 mm. Mediastinal mass is contiguous with bulky right hilar mass measuring 5.3 cm. Esophagus within normal limits. No thyroid mass. Tracheal deviation to the left by mass. Narrowed appearance of the tracheal bronchial bifurcation and right bronchus by tumor. Right supraclavicular node up to 2.7 cm. Hazy infiltration of the right axilla with multiple small nodes. Lungs/Pleura: Mild emphysematous disease. Multiple pulmonary nodules concerning for metastatic disease. Dominant nodule in the left upper lobe measuring 15 x 14 mm, not significantly changed. Increased small loculated right pleural effusion. Upper Abdomen: Abnormal enhancement of the central liver and caudate lobe, likely due to altered perfusion dynamics. Musculoskeletal: No acute or suspicious osseous abnormality. Review of the MIP images confirms the above findings. IMPRESSION: 1. Occlusion of the right upper pulmonary artery and branch vessels due to the presence of a large mediastinal mass. Significant narrowing of right lower lobe pulmonary artery as well. These findings are similar as compared with 08/22/2018. No acute embolus visualized within the enhancing portions of the right middle and lower lobe pulmonary vessels nor the left-sided vasculature. 2. Large mediastinal mass with bulky mediastinal and hilar adenopathy. Interval finding of non enhancement of right  brachiocephalic vein with tumor occlusion of the upper aspect of the SVC. Interim development of extensive left chest wall and paraspinal collateral vessels. This is suspected to be secondary to thrombus/DVT within the left subclavian, distal jugular, and brachiocephalic veins. 3. Marked mass-effect on the lower trachea by mediastinal mass lesion with AP diameter of the distal trachea above the bifurcation measuring 7 mm. There is narrowing of the right greater than left bronchi as well. 4. Multiple bilateral pulmonary nodules concerning for metastatic disease, grossly unchanged. Large right supraclavicular lymph node concerning for metastatic disease. 5. Increased small slightly loculated right posterior pleural effusion. 6. Abnormal enhancement pattern of the liver, presumably due to altered perfusion dynamics from extensive collateral vasculature. Aortic Atherosclerosis (ICD10-I70.0) and Emphysema (ICD10-J43.9). Electronically Signed   By: Donavan Foil M.D.   On: 08/30/2018 21:17    ____________________________________________  INITIAL IMPRESSION / ASSESSMENT AND PLAN / ED COURSE  Patient was noted to get orthopnea.  And even tachycardic and tachypneic on my exam.  Will evaluate for possible etiologies for this.  Consider pleural effusion versus heart failure versus pulmonary embolus.  Workup relative unremarkable aside from evident new right lower lobe opacity.  Suspect this is probably pneumonia with his persistent tachycardia, tachypnea, cough and difficulty laying flat.    abx started. Plan for admission for further workup and management.   CRITICAL CARE Performed by: Merrily Pew Total critical care time: 35 minutes Critical care time was exclusive of separately billable procedures and treating other patients. Critical care was necessary to treat or prevent imminent or life-threatening deterioration. Critical care was time spent personally by me on the following activities: development  of treatment plan with patient and/or surrogate as well as nursing, discussions with consultants, evaluation of patient's response to treatment, examination of patient, obtaining history from patient or surrogate, ordering and performing treatments and interventions, ordering and review of laboratory studies, ordering and review of radiographic studies, pulse oximetry and re-evaluation of patient's condition.      Pertinent labs & imaging results that were available during my care of the patient were reviewed by me and considered in my medical decision making (see chart for details).  ____________________________________________  FINAL CLINICAL IMPRESSION(S) / ED DIAGNOSES  Final diagnoses:  Dyspnea, unspecified type     MEDICATIONS GIVEN DURING THIS VISIT:  Medications  acetaminophen (TYLENOL) tablet 650 mg (has no administration in time range)    Or  acetaminophen (TYLENOL) suppository 650 mg (has no administration in time range)  ondansetron (ZOFRAN) tablet 4 mg (has no administration in time range)    Or  ondansetron (ZOFRAN) injection 4 mg (has no administration in time range)  polyethylene glycol (MIRALAX / GLYCOLAX) packet 17 g (has no administration in time range)  morphine 2 MG/ML injection 2 mg (has no administration in time range)  methylPREDNISolone sodium succinate (SOLU-MEDROL) 125 mg/2 mL injection 80 mg (has no administration in time range)  guaiFENesin-dextromethorphan (ROBITUSSIN DM) 100-10 MG/5ML syrup 5 mL (has no administration in time range)  phenol (CHLORASEPTIC) mouth spray 1 spray (has no administration in time range)  cefTRIAXone (ROCEPHIN) 1 g in sodium chloride 0.9 % 100 mL IVPB (0 g Intravenous Stopped 08/30/18 2225)  azithromycin (ZITHROMAX) 500 mg in sodium chloride 0.9 % 250 mL IVPB (0 mg Intravenous Stopped 08/30/18 2000)  lactated ringers bolus 1,000 mL (0 mLs Intravenous Stopped 08/30/18 2252)  iopamidol (ISOVUE-370) 76 % injection 100 mL (100 mLs  Intravenous Contrast Given 08/30/18 2027)  morphine 4 MG/ML injection 4 mg (4 mg Intravenous Given 08/30/18 2049)  methylPREDNISolone sodium succinate (SOLU-MEDROL) 125 mg/2 mL injection 125 mg (125 mg Intravenous Given 08/30/18 2303)     NEW OUTPATIENT MEDICATIONS STARTED DURING THIS VISIT:  Current Discharge Medication List      Note:  This note was prepared with assistance of Dragon voice recognition software. Occasional wrong-word or sound-a-like substitutions may have occurred due to the inherent limitations of voice recognition software.   Michele Kerlin, Corene Cornea, MD 08/31/18 2500    Merrily Pew, MD 09/09/18 1341

## 2018-08-30 NOTE — ED Triage Notes (Signed)
Patient BIB Cancer center staff. Report received from [facility]. Patient has been diagnosed recently with melanoma, and has been receiving radiation treatments. Patient was supposed to get radiation today, but patient could not lay flat due to SOB. Patient tachycardic and tachypnic in triage with tripoding. Patient on 2LPatient BIB Cancer center staff. Report received from [facility]. Patient has been diagnosed recently with melanoma, and has been receiving radiation treatments. Patient was supposed to get radiation today, but patient could not lay flat due to SOB. Patient tachycardic and tachypnic in triage with tripoding. Patient on [place] continuously

## 2018-08-30 NOTE — ED Notes (Signed)
ED TO INPATIENT HANDOFF REPORT  Name/Age/Gender Gerald Adkins 62 y.o. male  Code Status Code Status History    Date Active Date Inactive Code Status Order ID Comments User Context   08/22/2018 1622 08/26/2018 1751 Full Code [id number]  Bufford Lope, DO ED      Home/SNF/Other Home  Chief Complaint short of breath   Level of Care/Admitting Diagnosis ED Disposition    ED Disposition Condition [facility] Area: [facility] [[id number]]  Level of Care: Telemetry [5]  Admit to tele based on following criteria: Complex arrhythmia (Bradycardia/Tachycardia)  Diagnosis: Dyspnea [756433]  Admitting Physician: Bethena Roys [phone]  Attending Physician: Bethena Roys Nessa.Cuff  PT Class (Do Not Modify): Observation [104]  PT Acc Code (Do Not Modify): Observation [10022]       Medical History Past Medical History:  Diagnosis Date  . Chronic back pain 1989   broke back  . Hyperlipidemia   . Hypertension   . Opiate addiction ([place])    remote  . Stroke [facility])     Allergies Allergies  Allergen Reactions  . Butterscotch Flavor Anaphylaxis and Shortness Of Breath    IV Location/Drains/Wounds Patient Lines/Drains/Airways Status   Active Line/Drains/Airways    Name:   Placement date:   Placement time:   Site:   Days:   Peripheral IV 08/30/18 Left Antecubital   08/30/18    1700    Antecubital   less than 1   Incision (Closed) 08/23/18 Neck Right   08/23/18    0849     7          Labs/Imaging Results for orders placed or performed during the hospital encounter of 08/30/18 (from the past 48 hour(s))  I-Stat CG4 Lactic Acid, ED     Status: Abnormal   Collection Time: 08/30/18  5:10 PM  Result Value Ref Range   Lactic Acid, Venous 2.89 (HH) 0.5 - 1.9 mmol/L   Comment NOTIFIED PHYSICIAN   CBC with Differential     Status: Abnormal   Collection Time: 08/30/18  5:21 PM  Result Value Ref Range   WBC 10.7 (H) 4.0 - 10.5 K/uL   RBC 4.43  4.22 - 5.81 MIL/uL   Hemoglobin 9.1 (L) 13.0 - 17.0 g/dL   HCT 30.5 (L) 39.0 - 52.0 %   MCV 68.8 (L) 78.0 - 100.0 fL   MCH 20.5 (L) 26.0 - 34.0 pg   MCHC 29.8 (L) 30.0 - 36.0 g/dL   RDW 19.9 (H) 11.5 - 15.5 %   Platelets 435 (H) 150 - 400 K/uL   Neutrophils Relative % 88 %   Lymphocytes Relative 6 %   Monocytes Relative 6 %   Eosinophils Relative 0 %   Basophils Relative 0 %   Neutro Abs 9.5 (H) 1.7 - 7.7 K/uL   Lymphs Abs 0.6 (L) 0.7 - 4.0 K/uL   Monocytes Absolute 0.6 0.1 - 1.0 K/uL   Eosinophils Absolute 0.0 0.0 - 0.7 K/uL   Basophils Absolute 0.0 0.0 - 0.1 K/uL   RBC Morphology POLYCHROMASIA PRESENT     Comment: ELLIPTOCYTES Performed at [facility], [place] [address] [place], [place] [address]   Comprehensive metabolic panel     Status: Abnormal   Collection Time: 08/30/18  5:21 PM  Result Value Ref Range   Sodium 132 (L) 135 - 145 mmol/L   Potassium 3.9 3.5 - 5.1 mmol/L   Chloride 92 (L) 98 - 111 mmol/L  CO2 27 22 - 32 mmol/L   Glucose, Bld 102 (H) 70 - 99 mg/dL   BUN 13 6 - 20 mg/dL   Creatinine, Ser 0.56 (L) 0.61 - 1.24 mg/dL   Calcium 8.6 (L) 8.9 - 10.3 mg/dL   Total Protein 6.6 6.5 - 8.1 g/dL   Albumin 3.1 (L) 3.5 - 5.0 g/dL   AST 21 15 - 41 U/L   ALT 17 0 - 44 U/L   Alkaline Phosphatase 64 38 - 126 U/L   Total Bilirubin 0.6 0.3 - 1.2 mg/dL   GFR calc non Af Amer >60 >60 mL/min   GFR calc Af Amer >60 >60 mL/min    Comment: (NOTE) The eGFR has been calculated using the CKD EPI equation. This calculation has not been validated in all clinical situations. eGFR's persistently <60 mL/min signify possible Chronic Kidney Disease.    Anion gap 13 5 - 15    Comment: Performed at [facility], [place] [address], [place], [place] [address]  Troponin I     Status: None   Collection Time: 08/30/18  5:21 PM  Result Value Ref Range   Troponin I <0.03 <0.03 ng/mL    Comment: Performed at [facility], [place] [address], [place], [place] [address]  Brain natriuretic peptide     Status: None   Collection Time: 08/30/18  5:21 PM  Result Value Ref Range   B Natriuretic Peptide 31.2 0.0 - 100.0 pg/mL    Comment: Performed at [facility], [place] [address], [place], [place] [address]  I-Stat CG4 Lactic Acid, ED     Status: Abnormal   Collection Time: 08/30/18  8:16 PM  Result Value Ref Range   Lactic Acid, Venous 2.90 (HH) 0.5 - 1.9 mmol/L   Comment NOTIFIED PHYSICIAN    Dg Chest 2 View  Result Date: 08/30/2018 CLINICAL DATA:  Patient has been diagnosed recently with melanoma, and has been receiving radiation treatments. Patient was supposed to get radiation today, but patient could not lay flat due to SOB. Patient tachycardic and tachypnic in triage with tripoding EXAM: CHEST - 2 VIEW COMPARISON:  Chest CT and chest radiographs, 08/22/2018. FINDINGS: Large right mediastinal at contiguous right upper lobe mass, contiguous right hilar mass and smaller nodule above the left hilar structures in the left upper lobe are stable from the prior exams. There is opacity at the right lung base that is new from the prior chest radiograph and chest CT, likely atelectasis. Pneumonia is possible. Remainder of the lungs is clear. Cardiac silhouette is normal in size. No pleural effusion or pneumothorax. Skeletal structures are intact. IMPRESSION: 1. New right lung base opacity, most likely atelectasis. Consider pneumonia if there are consistent clinical findings. 2. No other change from the prior chest CT and chest radiograph. Large right mediastinal and contiguous right hilar and upper lobe mass consistent with carcinoma. Presumed metastatic lung nodule in the central left upper lobe. Electronically Signed   By: Lajean Manes M.D.   On: 08/30/2018 16:48   Ct Angio Chest Pe W Or Wo Contrast  Result Date: 08/30/2018 CLINICAL DATA:  History of melanoma and lung mass EXAM: CT ANGIOGRAPHY CHEST WITH CONTRAST TECHNIQUE:  Multidetector CT imaging of the chest was performed using the standard protocol during bolus administration of intravenous contrast. Multiplanar CT image reconstructions and MIPs were obtained to evaluate the vascular anatomy. CONTRAST:  140m ISOVUE-370 IOPAMIDOL (ISOVUE-370) INJECTION 76% COMPARISON:  Chest x-ray 08/30/2018, CT 08/22/2018 FINDINGS:  Cardiovascular: Satisfactory opacification of the pulmonary arteries to the segmental level. There is abrupt occlusion of the right upper lobe pulmonary artery by large mass lesion. The visible portions of enhancing right lower lobe pulmonary artery demonstrate no filling defects. No filling defect within the left-sided vasculature. There is significant narrowing of the right lower lobe pulmonary artery by mediastinal mass lesion and hilar mass. Coronary artery calcification. Normal heart size. Small pericardial effusion. Non enhancement of right brachiocephalic vein which is presumably occluded by tumor. There is narrowing of the left brachiocephalic vessel and subsequent occlusion of the left brachiocephalic vessel proximal to the confluence. There is narrowing of the superior vena cava with occluded appearance of the upper aspect of the vena cava. Interim finding of extensive collateral vessels within the left chest wall. Best seen on coronal views are filling defects within the left subclavian, distal jugular, and brachiocephalic vessels, concerning for thrombus. Aortic atherosclerosis. No aneurysm. Tumor within the mediastinum partially surrounds the ascending aorta and proximal arch. Mediastinum/Nodes: Large mediastinal mass measuring 13.1 x 10.5 by 10.7 cm, grossly unchanged in size as compared with recent CT. Multiple enlarged AP window lymph nodes. Bulky subcarinal lymph node measuring 28 mm. Mediastinal mass is contiguous with bulky right hilar mass measuring 5.3 cm. Esophagus within normal limits. No thyroid mass. Tracheal deviation to the left by mass.  Narrowed appearance of the tracheal bronchial bifurcation and right bronchus by tumor. Right supraclavicular node up to 2.7 cm. Hazy infiltration of the right axilla with multiple small nodes. Lungs/Pleura: Mild emphysematous disease. Multiple pulmonary nodules concerning for metastatic disease. Dominant nodule in the left upper lobe measuring 15 x 14 mm, not significantly changed. Increased small loculated right pleural effusion. Upper Abdomen: Abnormal enhancement of the central liver and caudate lobe, likely due to altered perfusion dynamics. Musculoskeletal: No acute or suspicious osseous abnormality. Review of the MIP images confirms the above findings. IMPRESSION: 1. Occlusion of the right upper pulmonary artery and branch vessels due to the presence of a large mediastinal mass. Significant narrowing of right lower lobe pulmonary artery as well. These findings are similar as compared with 08/22/2018. No acute embolus visualized within the enhancing portions of the right middle and lower lobe pulmonary vessels nor the left-sided vasculature. 2. Large mediastinal mass with bulky mediastinal and hilar adenopathy. Interval finding of non enhancement of right brachiocephalic vein with tumor occlusion of the upper aspect of the SVC. Interim development of extensive left chest wall and paraspinal collateral vessels. This is suspected to be secondary to thrombus/DVT within the left subclavian, distal jugular, and brachiocephalic veins. 3. Marked mass-effect on the lower trachea by mediastinal mass lesion with AP diameter of the distal trachea above the bifurcation measuring 7 mm. There is narrowing of the right greater than left bronchi as well. 4. Multiple bilateral pulmonary nodules concerning for metastatic disease, grossly unchanged. Large right supraclavicular lymph node concerning for metastatic disease. 5. Increased small slightly loculated right posterior pleural effusion. 6. Abnormal enhancement pattern of  the liver, presumably due to altered perfusion dynamics from extensive collateral vasculature. Aortic Atherosclerosis (ICD10-I70.0) and Emphysema (ICD10-J43.9). Electronically Signed   By: Donavan Foil M.D.   On: 08/30/2018 21:17    Pending Labs Unresulted Labs (From admission, onward)   None      Vitals/Pain Today's Vitals   08/30/18 1915 08/30/18 1930 08/30/18 2000 08/30/18 2015  BP: 112/70 108/67 (!) 105/54 104/66  Pulse: (!) 114 (!) 116 (!) 109 (!) 104  Resp: (!) 22 (!) 29  20 (!) 31  Temp:      TempSrc:      SpO2: 100% 100% 100% 100%    Isolation Precautions No active isolations  Medications Medications  cefTRIAXone (ROCEPHIN) 1 g in sodium chloride 0.9 % 100 mL IVPB (1 g Intravenous New Bag/Given 08/30/18 2053)  azithromycin (ZITHROMAX) 500 mg in sodium chloride 0.9 % 250 mL IVPB (0 mg Intravenous Stopped 08/30/18 2000)  lactated ringers bolus 1,000 mL ( Intravenous Restarted 08/30/18 2049)  iopamidol (ISOVUE-370) 76 % injection 100 mL (100 mLs Intravenous Contrast Given 08/30/18 2027)  morphine 4 MG/ML injection 4 mg (4 mg Intravenous Given 08/30/18 2049)    Mobility walks

## 2018-08-31 ENCOUNTER — Ambulatory Visit
Admission: RE | Admit: 2018-08-31 | Discharge: 2018-08-31 | Disposition: A | Discharge status: Home / Self Care | Payer: Medicare Other | Source: Ambulatory Visit | Attending: Radiation Oncology | Admitting: Radiation Oncology

## 2018-08-31 DIAGNOSIS — Z510 Encounter for antineoplastic radiation therapy: Secondary | ICD-10-CM | POA: Insufficient documentation

## 2018-08-31 DIAGNOSIS — I871 Compression of vein: Secondary | ICD-10-CM | POA: Insufficient documentation

## 2018-08-31 LAB — GLUCOSE, CAPILLARY: Glucose-Capillary: 121 mg/dL — ABNORMAL HIGH (ref 70–99)

## 2018-08-31 LAB — MRSA PCR SCREENING: MRSA BY PCR: NEGATIVE

## 2018-08-31 MED ORDER — PHENOL 1.4 % MT LIQD
1.0000 | OROMUCOSAL | Status: DC | PRN
  Filled 2018-08-31: qty 177

## 2018-08-31 MED ORDER — GUAIFENESIN-DM 100-10 MG/5ML PO SYRP
5.0000 mL | ORAL_SOLUTION | ORAL | Status: DC | PRN
  Administered 2018-08-31 – 2018-09-01 (×6): 5 mL via ORAL
  Filled 2018-08-31 (×6): qty 10

## 2018-08-31 MED ORDER — ORAL CARE MOUTH RINSE
15.0000 mL | Freq: Two times a day (BID) | OROMUCOSAL | Status: DC
  Administered 2018-09-04: 15 mL via OROMUCOSAL

## 2018-08-31 MED ORDER — BENZONATATE 100 MG PO CAPS
100.0000 mg | ORAL_CAPSULE | Freq: Three times a day (TID) | ORAL | Status: DC | PRN
  Administered 2018-08-31 – 2018-09-01 (×3): 100 mg via ORAL
  Filled 2018-08-31 (×3): qty 1

## 2018-08-31 MED ORDER — ENSURE ENLIVE PO LIQD
237.0000 mL | Freq: Three times a day (TID) | ORAL | Status: DC
  Administered 2018-08-31 (×2): 237 mL via ORAL

## 2018-08-31 NOTE — Progress Notes (Signed)
PROGRESS NOTE    Gerald Adkins  BTD:[id number] DOB: Jun 12, 1956 DOA: 08/30/2018 PCP: [name], MD    Brief Narrative:  62 y.o. 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  Patient reports when he is lying back or just reclining difficulty breathing becomes so severe he feels he is choking.  He is not on home O2.  Patient tells me he had some lower extremity swelling earlier in the week which has resolved today, he has improving but persistent swelling in his right upper extremity.  He denies fever or chills.  Reports coughing only when he lies flat, productive of whitish sputum.  Chronic right upper chest pain.  Reports chronic lower back pain.  Recent hospital admission  9/22- 08/26/18-patient presented with facial swelling, thoracic CT showed large mediastinal mass invading and narrowing the superior vena cava.  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  Patient completed 3 rounds of radiation treatment.  He was discharged to home, with O2 sats greater than 90% on room air.   Patient presented for radiation treatment today, was unable to lay flat for radiation treatment, patient became tachycardic and tachypneic, was placed on 2 L O2, and sent to [facility] long ED.  ED Course: Persistent tachycardia mostly 110s.  Intermittent tachypnea to 31, O2 sats greater than 97% on nasal cannula.  Two-view chest x-ray showed new right lung base opacity most likely atelectasis, consider pneumonia ED a consistent clinical findings.  Patient was started on IV ceftriaxone and azithromycin in the ED hospitalist was called to admit.  Assessment & Plan:   Principal Problem:   Dyspnea Active Problems:   SVC syndrome  Dyspnea -mostly positional. WBC 10.7.   -Worse on laying flat - Stat CTA  chest-similar sized large mediastinal mass with significant narrowing of right lower lobe pulmonary artery. NO embolus.  Bulky mediastinal and hilar adenopathy, marked mass-effect on lower trachea by mediastinal mass. 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   -Pathology from neck mass notable for melanoma. -Radiation oncology consulted  Chronic back pain- -continue morphine as tolerated  DVT prophylaxis: SCD's Code Status: Full Family Communication: Pt in room, family not in room Disposition Plan: Uncertain at this time  Consultants:   Radiation Oncology  Procedures:     Antimicrobials: Anti-infectives (From admission, onward)   Start     Dose/Rate Route Frequency Ordered Stop   08/30/18 1830  cefTRIAXone (ROCEPHIN) 1 g in sodium chloride 0.9 % 100 mL IVPB     1 g 200 mL/hr over 30 Minutes Intravenous  Once 08/30/18 1821 08/30/18 2225   08/30/18 1830  azithromycin (ZITHROMAX) 500 mg in sodium chloride 0.9 % 250 mL IVPB     500 mg 250 mL/hr over 60 Minutes Intravenous  Once 08/30/18 1821 08/30/18 2000       Subjective: Without complaints at this time  Objective: Vitals:   08/31/18 0800 08/31/18 0808 08/31/18 1000 08/31/18 1200  BP: 101/60  123/67 (!) 125/94  Pulse: (!) 106  (!) 114 (!) 112  Resp: 19  20 (!) 29  Temp:  97.6 F (36.4 C)  (!)  97.4 F (36.3 C)  TempSrc:  Oral  Oral  SpO2: 99%  98% 99%  Weight:      Height:        Intake/Output Summary (Last 24 hours) at 08/31/2018 1340 Last data filed at 08/31/2018 1309 Gross per 24 hour  Intake 1360 ml  Output 1191 ml  Net 169 ml   Filed Weights   08/30/18 2304 08/30/18 2355  Weight: 81.6 kg 84 kg    Examination:  General exam: Appears calm and  comfortable  Respiratory system: Clear to auscultation. Respiratory effort normal. Cardiovascular system: S1 & S2 heard, RRR Gastrointestinal system: Abdomen is nondistended, soft and nontender. No organomegaly or masses felt. Normal bowel sounds heard. Central nervous system: Alert and oriented. No focal neurological deficits. Extremities: Symmetric 5 x 5 power. Skin: No rashes, lesions Psychiatry: Judgement and insight appear normal. Mood & affect appropriate.   Data Reviewed: I have personally reviewed following labs and imaging studies  CBC: Recent Labs  Lab 08/25/18 0529 08/30/18 1721  WBC 10.4 10.7*  NEUTROABS  --  9.5*  HGB 8.8* 9.1*  HCT 31.2* 30.5*  MCV 68.1* 68.8*  PLT 647* 381*   Basic Metabolic Panel: Recent Labs  Lab 08/25/18 0529 08/30/18 1721  NA 136 132*  K 4.6 3.9  CL 97* 92*  CO2 29 27  GLUCOSE 117* 102*  BUN 16 13  CREATININE 0.66 0.56*  CALCIUM 9.2 8.6*  MG 2.1  --    GFR: Estimated Creatinine Clearance: 111 mL/min (A) (by C-G formula based on SCr of 0.56 mg/dL (L)). Liver Function Tests: Recent Labs  Lab 08/30/18 1721  AST 21  ALT 17  ALKPHOS 64  BILITOT 0.6  PROT 6.6  ALBUMIN 3.1*   No results for input(s): LIPASE, AMYLASE in the last 168 hours. No results for input(s): AMMONIA in the last 168 hours. Coagulation Profile: No results for input(s): INR, PROTIME in the last 168 hours. Cardiac Enzymes: Recent Labs  Lab 08/30/18 1721  TROPONINI <0.03   BNP (last 3 results) No results for input(s): PROBNP in the last 8760 hours. HbA1C: No results for input(s): HGBA1C in the last 72 hours. CBG: Recent Labs  Lab 08/30/18 1501  GLUCAP 121*   Lipid Profile: No results for input(s): CHOL, HDL, LDLCALC, TRIG, CHOLHDL, LDLDIRECT in the last 72 hours. Thyroid Function Tests: No results for input(s): TSH, T4TOTAL, FREET4, T3FREE, THYROIDAB in the last 72 hours. Anemia Panel: No results for input(s): VITAMINB12, FOLATE, FERRITIN, TIBC,  IRON, RETICCTPCT in the last 72 hours. Sepsis Labs: Recent Labs  Lab 08/30/18 1710 08/30/18 2016  LATICACIDVEN 2.89* 2.90*    Recent Results (from the past 240 hour(s))  Surgical pcr screen     Status: None   Collection Time: 08/22/18  8:07 PM  Result Value Ref Range Status   MRSA, PCR NEGATIVE NEGATIVE Final   Staphylococcus aureus NEGATIVE NEGATIVE Final    Comment: (NOTE) The Xpert SA Assay (FDA approved for NASAL specimens in patients 62 years of age and older), is one component of a comprehensive surveillance program. It is not intended to diagnose infection nor to guide or monitor treatment. Performed at [facility], [place] [address], [place], [place] [address]   MRSA PCR Screening     Status: None   Collection Time: 08/30/18 11:56 PM  Result Value Ref Range Status   MRSA by PCR NEGATIVE NEGATIVE Final    Comment:        The GeneXpert MRSA  Assay (FDA approved for NASAL specimens only), is one component of a comprehensive MRSA colonization surveillance program. It is not intended to diagnose MRSA infection nor to guide or monitor treatment for MRSA infections. Performed at [facility], [place] [address], [place], [place] [address]      Radiology Studies: Dg Chest 2 View  Result Date: 08/30/2018 CLINICAL DATA:  Patient has been diagnosed recently with melanoma, and has been receiving radiation treatments. Patient was supposed to get radiation today, but patient could not lay flat due to SOB. Patient tachycardic and tachypnic in triage with tripoding EXAM: CHEST - 2 VIEW COMPARISON:  Chest CT and chest radiographs, 08/22/2018. FINDINGS: Large right mediastinal at contiguous right upper lobe mass, contiguous right hilar mass and smaller nodule above the left hilar structures in the left upper lobe are stable from the prior exams. There is opacity at the right lung base that is new from the prior chest radiograph and chest CT, likely atelectasis.  Pneumonia is possible. Remainder of the lungs is clear. Cardiac silhouette is normal in size. No pleural effusion or pneumothorax. Skeletal structures are intact. IMPRESSION: 1. New right lung base opacity, most likely atelectasis. Consider pneumonia if there are consistent clinical findings. 2. No other change from the prior chest CT and chest radiograph. Large right mediastinal and contiguous right hilar and upper lobe mass consistent with carcinoma. Presumed metastatic lung nodule in the central left upper lobe. Electronically Signed   By: Lajean Manes M.D.   On: 08/30/2018 16:48   Ct Angio Chest Pe W Or Wo Contrast  Result Date: 08/30/2018 CLINICAL DATA:  History of melanoma and lung mass EXAM: CT ANGIOGRAPHY CHEST WITH CONTRAST TECHNIQUE: Multidetector CT imaging of the chest was performed using the standard protocol during bolus administration of intravenous contrast. Multiplanar CT image reconstructions and MIPs were obtained to evaluate the vascular anatomy. CONTRAST:  156mL ISOVUE-370 IOPAMIDOL (ISOVUE-370) INJECTION 76% COMPARISON:  Chest x-ray 08/30/2018, CT 08/22/2018 FINDINGS: Cardiovascular: Satisfactory opacification of the pulmonary arteries to the segmental level. There is abrupt occlusion of the right upper lobe pulmonary artery by large mass lesion. The visible portions of enhancing right lower lobe pulmonary artery demonstrate no filling defects. No filling defect within the left-sided vasculature. There is significant narrowing of the right lower lobe pulmonary artery by mediastinal mass lesion and hilar mass. Coronary artery calcification. Normal heart size. Small pericardial effusion. Non enhancement of right brachiocephalic vein which is presumably occluded by tumor. There is narrowing of the left brachiocephalic vessel and subsequent occlusion of the left brachiocephalic vessel proximal to the confluence. There is narrowing of the superior vena cava with occluded appearance of the upper  aspect of the vena cava. Interim finding of extensive collateral vessels within the left chest wall. Best seen on coronal views are filling defects within the left subclavian, distal jugular, and brachiocephalic vessels, concerning for thrombus. Aortic atherosclerosis. No aneurysm. Tumor within the mediastinum partially surrounds the ascending aorta and proximal arch. Mediastinum/Nodes: Large mediastinal mass measuring 13.1 x 10.5 by 10.7 cm, grossly unchanged in size as compared with recent CT. Multiple enlarged AP window lymph nodes. Bulky subcarinal lymph node measuring 28 mm. Mediastinal mass is contiguous with bulky right hilar mass measuring 5.3 cm. Esophagus within normal limits. No thyroid mass. Tracheal deviation to the left by mass. Narrowed appearance of the tracheal bronchial bifurcation and right bronchus by tumor. Right supraclavicular node up to 2.7 cm. Hazy infiltration of the right axilla with multiple small nodes.  Lungs/Pleura: Mild emphysematous disease. Multiple pulmonary nodules concerning for metastatic disease. Dominant nodule in the left upper lobe measuring 15 x 14 mm, not significantly changed. Increased small loculated right pleural effusion. Upper Abdomen: Abnormal enhancement of the central liver and caudate lobe, likely due to altered perfusion dynamics. Musculoskeletal: No acute or suspicious osseous abnormality. Review of the MIP images confirms the above findings. IMPRESSION: 1. Occlusion of the right upper pulmonary artery and branch vessels due to the presence of a large mediastinal mass. Significant narrowing of right lower lobe pulmonary artery as well. These findings are similar as compared with 08/22/2018. No acute embolus visualized within the enhancing portions of the right middle and lower lobe pulmonary vessels nor the left-sided vasculature. 2. Large mediastinal mass with bulky mediastinal and hilar adenopathy. Interval finding of non enhancement of right brachiocephalic  vein with tumor occlusion of the upper aspect of the SVC. Interim development of extensive left chest wall and paraspinal collateral vessels. This is suspected to be secondary to thrombus/DVT within the left subclavian, distal jugular, and brachiocephalic veins. 3. Marked mass-effect on the lower trachea by mediastinal mass lesion with AP diameter of the distal trachea above the bifurcation measuring 7 mm. There is narrowing of the right greater than left bronchi as well. 4. Multiple bilateral pulmonary nodules concerning for metastatic disease, grossly unchanged. Large right supraclavicular lymph node concerning for metastatic disease. 5. Increased small slightly loculated right posterior pleural effusion. 6. Abnormal enhancement pattern of the liver, presumably due to altered perfusion dynamics from extensive collateral vasculature. Aortic Atherosclerosis (ICD10-I70.0) and Emphysema (ICD10-J43.9). Electronically Signed   By: Donavan Foil M.D.   On: 08/30/2018 21:17    Scheduled Meds: . mouth rinse  15 mL Mouth Rinse BID  . methylPREDNISolone (SOLU-MEDROL) injection  80 mg Intravenous Q12H   Continuous Infusions:   LOS: 1 day   Marylu Lund, MD [facility] Pager On Amion  If 7PM-7AM, please contact night-coverage 08/31/2018, 1:40 PM

## 2018-08-31 NOTE — Progress Notes (Signed)
Patient informed RN that he was having discomfort in his back and decreased sensation from the waist down. Since admission from ED, patient has been sitting in bed with HOB at 90 degrees with his head laying over the bedside table; similar to a tripod position. Patient has not been in respiratory distress, he states he is most comfortable resting like this. Upon palpation to his lower extremities and back, patient states he can feel pressure from touch, but states the sensation of touch "feels differently". RN recommended repositioning, patient declined. K pad heat applied to patient's lower back and SCDs in place on patient's bilateral lower extremities below the knee. Will continue to monitor.

## 2018-08-31 NOTE — Progress Notes (Signed)
Initial Nutrition Assessment  DOCUMENTATION CODES:   Not applicable  INTERVENTION:  Enure Enlive TID to provide: 350kcal 20g protein per serving   NUTRITION DIAGNOSIS:   Inadequate oral intake related to cancer and cancer related treatments(radiation; 3 treatments) as evidenced by energy intake < 75% for > 7 days.   GOAL:   Patient will meet greater than or equal to 90% of their needs  MONITOR:   PO intake, Supplement acceptance, Labs, Weight trends  REASON FOR ASSESSMENT:   Malnutrition Screening Tool    ASSESSMENT:  Pt admitted dyspnea, mediastinal and hilar adenopathy w/ chronic rt upper chest pain. 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    Pt awake and reported feeling better and gaining strength. Pt reports eating 100% of lunch today which consisted of meatloaf, stewed toms, mashed pots. Pt stated that he had a loss of appetite lasting 2 weeks prior to admission and purchased Boost for days he did not feel like eating.   Labs Reviewed: Na 132 (L), Cr 0.56 (L), Ca corrects for low alumin Medications Reviewed: methylprednisolone, morphine  NUTRITION - FOCUSED PHYSICAL EXAM:    Most Recent Value  Orbital Region  Mild depletion  Upper Arm Region  No depletion  Thoracic and Lumbar Region  No depletion  Buccal Region  Mild depletion  Temple Region  Mild depletion  Clavicle Bone Region  Mild depletion  Clavicle and Acromion Bone Region  No depletion  Scapular Bone Region  No depletion  Dorsal Hand  Mild depletion  Patellar Region  No depletion  Anterior Thigh Region  No depletion  Posterior Calf Region  No depletion  Edema (RD Assessment)  Mild [RUE, RLE non-pitting]  Hair  Reviewed  Eyes  Reviewed  Mouth  Reviewed  Skin  Reviewed  Nails  Reviewed       Diet Order:   Diet Order            Diet Heart Room service appropriate? Yes; Fluid consistency: Thin  Diet effective now               EDUCATION NEEDS:   No education needs have been identified at this time  Skin:  Skin Assessment: (hemrhoids)  Last BM:  9/30; type 5  Height:   Ht Readings from Last 1 Encounters:  08/30/18 6\' 1"  (1.854 m)    Weight:   Wt Readings from Last 1 Encounters:  08/30/18 84 kg    Ideal Body Weight:  84 kg  BMI:  Body mass index is 24.43 kg/m.  Estimated Nutritional Needs:   Kcal:  2100-2500 (25-30kcal/kg)  Protein:  109-126 (1.3-1.5g/kg)  Fluid:  1.5L    Lajuan Lines, RD, LDN  After Hours/Weekend Pager: [phone]

## 2018-08-31 NOTE — Progress Notes (Signed)
Patient complaining of pain, pointing to his sternum. When asked, he states that pain does not radiate but is a new onset. Upon reviewing chart an EKG has not been performed this admission. EKG completed, NP made aware of EKG results and description of patient's pain. Patient refusing PRN pain medication at this time. Will continue to monitor.

## 2018-09-01 ENCOUNTER — Ambulatory Visit
Admission: RE | Admit: 2018-09-01 | Discharge: 2018-09-01 | Disposition: A | Discharge status: Home / Self Care | Payer: Medicare Other | Source: Ambulatory Visit | Attending: Radiation Oncology | Admitting: Radiation Oncology

## 2018-09-01 DIAGNOSIS — C799 Secondary malignant neoplasm of unspecified site: Secondary | ICD-10-CM | POA: Diagnosis present

## 2018-09-01 DIAGNOSIS — G8929 Other chronic pain: Secondary | ICD-10-CM | POA: Diagnosis present

## 2018-09-01 DIAGNOSIS — R059 Cough, unspecified: Secondary | ICD-10-CM | POA: Diagnosis present

## 2018-09-01 DIAGNOSIS — R05 Cough: Secondary | ICD-10-CM | POA: Diagnosis present

## 2018-09-01 DIAGNOSIS — R918 Other nonspecific abnormal finding of lung field: Secondary | ICD-10-CM | POA: Diagnosis present

## 2018-09-01 DIAGNOSIS — M549 Dorsalgia, unspecified: Secondary | ICD-10-CM

## 2018-09-01 DIAGNOSIS — C439 Malignant melanoma of skin, unspecified: Secondary | ICD-10-CM | POA: Diagnosis present

## 2018-09-01 MED ORDER — HYDROCODONE-ACETAMINOPHEN 7.5-325 MG/15ML PO SOLN
10.0000 mL | ORAL | Status: DC | PRN
  Administered 2018-09-01 – 2018-09-03 (×6): 10 mL via ORAL
  Filled 2018-09-01 (×5): qty 15

## 2018-09-01 MED ORDER — AMLODIPINE BESYLATE 5 MG PO TABS
5.0000 mg | ORAL_TABLET | Freq: Every day | ORAL | Status: DC
  Administered 2018-09-01 – 2018-09-05 (×5): 5 mg via ORAL
  Filled 2018-09-01 (×5): qty 1

## 2018-09-01 MED ORDER — SENNA 8.6 MG PO TABS
1.0000 | ORAL_TABLET | Freq: Every day | ORAL | Status: DC
  Administered 2018-09-01 – 2018-09-04 (×4): 8.6 mg via ORAL
  Filled 2018-09-01 (×4): qty 1

## 2018-09-01 MED ORDER — MORPHINE SULFATE (PF) 2 MG/ML IV SOLN
2.0000 mg | INTRAVENOUS | Status: DC | PRN
  Administered 2018-09-01 – 2018-09-03 (×3): 2 mg via INTRAVENOUS
  Filled 2018-09-01 (×3): qty 1

## 2018-09-01 MED ORDER — MENTHOL 3 MG MT LOZG
1.0000 | LOZENGE | OROMUCOSAL | Status: DC | PRN
  Filled 2018-09-01: qty 9

## 2018-09-01 MED ORDER — HYDROCOD POLST-CPM POLST ER 10-8 MG/5ML PO SUER
5.0000 mL | Freq: Two times a day (BID) | ORAL | Status: DC
  Administered 2018-09-01 – 2018-09-05 (×9): 5 mL via ORAL
  Filled 2018-09-01 (×9): qty 5

## 2018-09-01 MED ORDER — HYDROCODONE-ACETAMINOPHEN 7.5-325 MG/15ML PO SOLN
10.0000 mL | ORAL | Status: DC | PRN
  Filled 2018-09-01: qty 15

## 2018-09-01 MED ORDER — GUAIFENESIN-DM 100-10 MG/5ML PO SYRP
10.0000 mL | ORAL_SOLUTION | ORAL | Status: DC
  Administered 2018-09-01 – 2018-09-05 (×22): 10 mL via ORAL
  Filled 2018-09-01 (×23): qty 10

## 2018-09-01 MED ORDER — TRAZODONE HCL 50 MG PO TABS
50.0000 mg | ORAL_TABLET | Freq: Every day | ORAL | Status: DC
  Administered 2018-09-01 – 2018-09-04 (×4): 50 mg via ORAL
  Filled 2018-09-01 (×4): qty 1

## 2018-09-01 NOTE — Progress Notes (Addendum)
PROGRESS NOTE    Gerald Adkins  WUJ:[id number] DOB: 1956/07/23 DOA: 08/30/2018 PCP: Gerald Ebbs, MD    Brief Narrative:  62 y.o. 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  Patient reports when he is lying back or just reclining difficulty breathing becomes so severe he feels he is choking.  He is not on home O2.  He had some lower extremity swelling earlier in the week which has resolved today, he has improving but persistent swelling in his right upper extremity. Chronic right upper chest pain.    Recent hospital admission  9/22- 08/26/18-patient presented with facial swelling, thoracic CT showed large mediastinal mass invading and narrowing the superior vena cava.  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  Patient completed 3 rounds of radiation treatment.  He was discharged to home, with O2 sats greater than 90% on room air.   Patient presented for radiation treatment on 9/30, was unable to lay flat for radiation treatment, patient became tachycardic and tachypneic, was placed on 2 L O2, and sent to [facility] long ED.  ED Course: Persistent tachycardia mostly 110s.  Intermittent tachypnea to 31, O2 sats greater than 97% on nasal cannula.  Two-view chest x-ray showed new right lung base opacity most likely atelectasis, consider pneumonia.  Patient was started on IV ceftriaxone and azithromycin in the ED hospitalist was called to admit.   Hospital course: Stat CTA chest-similar sized large mediastinal mass with significant narrowing of right lower lobe pulmonary artery. NO embolus.  Bulky mediastinal and hilar adenopathy, marked mass-effect on lower trachea by mediastinal mass. CTS was consulted, recommendation for IV steroids. Radiation oncology also consulted.      Assessment &  Plan:   Principal Problem:   SVC syndrome Active Problems:   Dyspnea   Lung mass   Melanoma (HCC)   Chronic back pain   Cough  Dyspnea -mostly positional. WBC 10.7.   -Worse on laying flat - Stat CTA chest-similar sized large mediastinal mass with significant narrowing of right lower lobe pulmonary artery. NO embolus.  Bulky mediastinal and hilar adenopathy, marked mass-effect on lower trachea by mediastinal mass. 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   -Pathology from neck mass notable for melanoma. -Radiation oncology consulted -consult palliative care for goal of care, code status and symptomatic management  Intractable cough -adding Vicodin  -change Robitussin DM to q4h from PRN; change tessalon to Tussionex -iv PRN morphine for cough and dyspnea relief  Chronic back pain- -continue morphine as tolerated  DVT prophylaxis: SCD's Code Status: Full Family Communication: Pt in room, family not in room Disposition Plan: Uncertain at this time  Consultants:   CTS; Radiation Oncology; palliative care  Procedures:     Antimicrobials: Anti-infectives (From admission, onward)   Start     Dose/Rate Route Frequency Ordered Stop   08/30/18 1830  cefTRIAXone (ROCEPHIN) 1 g in sodium chloride 0.9 % 100 mL IVPB     1 g 200 mL/hr over 30 Minutes Intravenous  Once 08/30/18 1821 08/30/18 2225   08/30/18 1830  azithromycin (ZITHROMAX) 500 mg in sodium chloride 0.9 % 250  mL IVPB     500 mg 250 mL/hr over 60 Minutes Intravenous  Once 08/30/18 1821 08/30/18 2000      Subjective: Severe cough interfering his sleep and also causes more dyspnea   Objective: Vitals:   09/01/18 0000 09/01/18 0343 09/01/18 0600 09/01/18 0800    BP: 140/74  131/73 (!) 142/95  Pulse: (!) 115  (!) 106 (!) 141  Resp: (!) 23  16 (!) 30  Temp:  97.9 F (36.6 C)    TempSrc:  Oral    SpO2: 97%  97% 99%  Weight:      Height:        Intake/Output Summary (Last 24 hours) at 09/01/2018 0820 Last data filed at 09/01/2018 0730 Gross per 24 hour  Intake 240 ml  Output 1351 ml  Net -1111 ml   Filed Weights   08/30/18 2304 08/30/18 2355  Weight: 81.6 kg 84 kg    Examination:  General exam: distressed with cough  Respiratory system: Clear to auscultation but more diminished breath sound over the apex and base, better sound in the middle. No wheezing or rhonchi. Mild respiratory distress Cardiovascular system: S1 & S2 heard, tachycardia Gastrointestinal system: Abdomen is nondistended, soft and nontender. No organomegaly or masses felt. Normal bowel sounds heard. Central nervous system: Alert and oriented. No focal neurological deficits. Extremities: Symmetric 5 x 5 power. Skin: superficial vein dilation over the neck and upper chest Psychiatry: Judgement and insight appear normal. Mood & affect appropriate.   Data Reviewed: I have personally reviewed following labs and imaging studies  CBC: Recent Labs  Lab 08/30/18 1721  WBC 10.7*  NEUTROABS 9.5*  HGB 9.1*  HCT 30.5*  MCV 68.8*  PLT 240*   Basic Metabolic Panel: Recent Labs  Lab 08/30/18 1721  NA 132*  K 3.9  CL 92*  CO2 27  GLUCOSE 102*  BUN 13  CREATININE 0.56*  CALCIUM 8.6*   GFR: Estimated Creatinine Clearance: 111 mL/min (A) (by C-G formula based on SCr of 0.56 mg/dL (L)). Liver Function Tests: Recent Labs  Lab 08/30/18 1721  AST 21  ALT 17  ALKPHOS 64  BILITOT 0.6  PROT 6.6  ALBUMIN 3.1*   No results for input(s): LIPASE, AMYLASE in the last 168 hours. No results for input(s): AMMONIA in the last 168 hours. Coagulation Profile: No results for input(s): INR, PROTIME in the last 168 hours. Cardiac Enzymes: Recent Labs  Lab 08/30/18 1721   TROPONINI <0.03   BNP (last 3 results) No results for input(s): PROBNP in the last 8760 hours. HbA1C: No results for input(s): HGBA1C in the last 72 hours. CBG: Recent Labs  Lab 08/30/18 1501  GLUCAP 121*   Lipid Profile: No results for input(s): CHOL, HDL, LDLCALC, TRIG, CHOLHDL, LDLDIRECT in the last 72 hours. Thyroid Function Tests: No results for input(s): TSH, T4TOTAL, FREET4, T3FREE, THYROIDAB in the last 72 hours. Anemia Panel: No results for input(s): VITAMINB12, FOLATE, FERRITIN, TIBC, IRON, RETICCTPCT in the last 72 hours. Sepsis Labs: Recent Labs  Lab 08/30/18 1710 08/30/18 2016  LATICACIDVEN 2.89* 2.90*    Recent Results (from the past 240 hour(s))  Surgical pcr screen     Status: None   Collection Time: 08/22/18  8:07 PM  Result Value Ref Range Status   MRSA, PCR NEGATIVE NEGATIVE Final   Staphylococcus aureus NEGATIVE NEGATIVE Final    Comment: (NOTE) The Xpert SA Assay (FDA approved for NASAL specimens in patients 75 years of age and older), is one  component of a comprehensive surveillance program. It is not intended to diagnose infection nor to guide or monitor treatment. Performed at [facility], [place] [address], [place], [place] [address]   MRSA PCR Screening     Status: None   Collection Time: 08/30/18 11:56 PM  Result Value Ref Range Status   MRSA by PCR NEGATIVE NEGATIVE Final    Comment:        The GeneXpert MRSA Assay (FDA approved for NASAL specimens only), is one component of a comprehensive MRSA colonization surveillance program. It is not intended to diagnose MRSA infection nor to guide or monitor treatment for MRSA infections. Performed at [facility], [place] [address], [place], [place] [address]      Radiology Studies: Dg Chest 2 View  Result Date: 08/30/2018 CLINICAL DATA:  Patient has been diagnosed recently with melanoma, and has been receiving radiation treatments. Patient was supposed to get  radiation today, but patient could not lay flat due to SOB. Patient tachycardic and tachypnic in triage with tripoding EXAM: CHEST - 2 VIEW COMPARISON:  Chest CT and chest radiographs, 08/22/2018. FINDINGS: Large right mediastinal at contiguous right upper lobe mass, contiguous right hilar mass and smaller nodule above the left hilar structures in the left upper lobe are stable from the prior exams. There is opacity at the right lung base that is new from the prior chest radiograph and chest CT, likely atelectasis. Pneumonia is possible. Remainder of the lungs is clear. Cardiac silhouette is normal in size. No pleural effusion or pneumothorax. Skeletal structures are intact. IMPRESSION: 1. New right lung base opacity, most likely atelectasis. Consider pneumonia if there are consistent clinical findings. 2. No other change from the prior chest CT and chest radiograph. Large right mediastinal and contiguous right hilar and upper lobe mass consistent with carcinoma. Presumed metastatic lung nodule in the central left upper lobe. Electronically Signed   By: Lajean Manes M.D.   On: 08/30/2018 16:48   Ct Angio Chest Pe W Or Wo Contrast  Result Date: 08/30/2018 CLINICAL DATA:  History of melanoma and lung mass EXAM: CT ANGIOGRAPHY CHEST WITH CONTRAST TECHNIQUE: Multidetector CT imaging of the chest was performed using the standard protocol during bolus administration of intravenous contrast. Multiplanar CT image reconstructions and MIPs were obtained to evaluate the vascular anatomy. CONTRAST:  156mL ISOVUE-370 IOPAMIDOL (ISOVUE-370) INJECTION 76% COMPARISON:  Chest x-ray 08/30/2018, CT 08/22/2018 FINDINGS: Cardiovascular: Satisfactory opacification of the pulmonary arteries to the segmental level. There is abrupt occlusion of the right upper lobe pulmonary artery by large mass lesion. The visible portions of enhancing right lower lobe pulmonary artery demonstrate no filling defects. No filling defect within the  left-sided vasculature. There is significant narrowing of the right lower lobe pulmonary artery by mediastinal mass lesion and hilar mass. Coronary artery calcification. Normal heart size. Small pericardial effusion. Non enhancement of right brachiocephalic vein which is presumably occluded by tumor. There is narrowing of the left brachiocephalic vessel and subsequent occlusion of the left brachiocephalic vessel proximal to the confluence. There is narrowing of the superior vena cava with occluded appearance of the upper aspect of the vena cava. Interim finding of extensive collateral vessels within the left chest wall. Best seen on coronal views are filling defects within the left subclavian, distal jugular, and brachiocephalic vessels, concerning for thrombus. Aortic atherosclerosis. No aneurysm. Tumor within the mediastinum partially surrounds the ascending aorta and proximal arch. Mediastinum/Nodes: Large mediastinal mass measuring 13.1 x 10.5 by 10.7  cm, grossly unchanged in size as compared with recent CT. Multiple enlarged AP window lymph nodes. Bulky subcarinal lymph node measuring 28 mm. Mediastinal mass is contiguous with bulky right hilar mass measuring 5.3 cm. Esophagus within normal limits. No thyroid mass. Tracheal deviation to the left by mass. Narrowed appearance of the tracheal bronchial bifurcation and right bronchus by tumor. Right supraclavicular node up to 2.7 cm. Hazy infiltration of the right axilla with multiple small nodes. Lungs/Pleura: Mild emphysematous disease. Multiple pulmonary nodules concerning for metastatic disease. Dominant nodule in the left upper lobe measuring 15 x 14 mm, not significantly changed. Increased small loculated right pleural effusion. Upper Abdomen: Abnormal enhancement of the central liver and caudate lobe, likely due to altered perfusion dynamics. Musculoskeletal: No acute or suspicious osseous abnormality. Review of the MIP images confirms the above findings.  IMPRESSION: 1. Occlusion of the right upper pulmonary artery and branch vessels due to the presence of a large mediastinal mass. Significant narrowing of right lower lobe pulmonary artery as well. These findings are similar as compared with 08/22/2018. No acute embolus visualized within the enhancing portions of the right middle and lower lobe pulmonary vessels nor the left-sided vasculature. 2. Large mediastinal mass with bulky mediastinal and hilar adenopathy. Interval finding of non enhancement of right brachiocephalic vein with tumor occlusion of the upper aspect of the SVC. Interim development of extensive left chest wall and paraspinal collateral vessels. This is suspected to be secondary to thrombus/DVT within the left subclavian, distal jugular, and brachiocephalic veins. 3. Marked mass-effect on the lower trachea by mediastinal mass lesion with AP diameter of the distal trachea above the bifurcation measuring 7 mm. There is narrowing of the right greater than left bronchi as well. 4. Multiple bilateral pulmonary nodules concerning for metastatic disease, grossly unchanged. Large right supraclavicular lymph node concerning for metastatic disease. 5. Increased small slightly loculated right posterior pleural effusion. 6. Abnormal enhancement pattern of the liver, presumably due to altered perfusion dynamics from extensive collateral vasculature. Aortic Atherosclerosis (ICD10-I70.0) and Emphysema (ICD10-J43.9). Electronically Signed   By: Donavan Foil M.D.   On: 08/30/2018 21:17    Scheduled Meds: . feeding supplement (ENSURE ENLIVE)  237 mL Oral TID BM  . mouth rinse  15 mL Mouth Rinse BID  . methylPREDNISolone (SOLU-MEDROL) injection  80 mg Intravenous Q12H   Continuous Infusions:   LOS: 2 days   Paticia Stack, MD [facility] Pager On Amion  If 7PM-7AM, please contact night-coverage 09/01/2018, 8:20 AM

## 2018-09-01 NOTE — Care Management Note (Signed)
Case Management Note  Patient Details  Name: Gerald Adkins MRN: [id number] Date of Birth: 1956/09/30  Subjective/Objective:                  Large mediastinal mass dvt in the left subclavian artery  Action/Plan: Will follow for progression of care and clinical status. Will follow for case management needs none present at this time.  Expected Discharge Date:                  Expected Discharge Plan:  Home/Self Care  In-House Referral:     Discharge planning Services  CM Consult  Post Acute Care Choice:    Choice offered to:     DME Arranged:    DME Agency:     HH Arranged:    HH Agency:     Status of Service:  In process, will continue to follow  If discussed at Long Length of Stay Meetings, dates discussed:    Additional Comments:  Leeroy Cha, RN 09/01/2018, 8:43 AM

## 2018-09-01 NOTE — Consult Note (Addendum)
Consultation Note Date: 09/01/2018   Patient Name: Gerald Adkins  DOB: 17-Jul-1956  MRN: [id number]  Age / Sex: 62 y.o., male  PCP: [name], MD Referring Physician: Paticia Stack, MD  Reason for Consultation: Establishing goals of care and Pain control  HPI/Patient Profile: 61 y.o. 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 Workup with CTA revealed similar sized mediastinal mass with significant narrowing of RLL pulmonary artery, bulky mediastinal and hilar adenopathy, marked mass-effect of lower trachea by mediastinal mass.  Clinical Assessment and Goals of Care:  I have reviewed medical records including EPIC notes, labs and imaging, received report from, assessed the patient and then met at the bedside along with the patient and Harrie Foreman to discuss diagnosis prognosis, GOC, EOL wishes, disposition and options.  I introduced Palliative Medicine as specialized medical care for people living with serious illness. It focuses on providing relief from the symptoms and stress of a serious illness. The goal is to improve quality of life for both the patient and the family.  We discussed a brief life review of the patient. We is married- but it is not an ideal situation. They are legally married, but he considers Jamas Lav his "ex-wife". However, he lives with her because he has nowhere else to live. He is disabled. He has a distrust of medical providers, specifically doctors due to some previous interactions. However, he also recognizes that he has received some medical  interventions from doctors that have saved and improved his life.  As far as functional and nutritional status- prior to admission he was mostly independent- he wore a depends because he does worry at times that he's incontinent. Has been losing weight since diagnosis. Cannot drive.     We discussed their current illness and what it means in the larger context of their on-going co-morbidities.  Natural disease trajectory and expectations at EOL were discussed. Regie notes that he has discussed his desires with Jamas Lav. His wishes are for full aggressive medical care and full resuscitative efforts including CPR and ventilation "unless I'm definitely going to be a vegetable". When asked if he ever saw a time when that line of thought may change he stated that God only knew when his EOL would occur. He stated he would "try all the treatments the doctors want me to do, and if they don't work, I'm going to try my own". He then discussed his preference for holistic and alternative treatments.   He has concerns regarding his diet- he prefers no artificial sweeteners, and does not like the supplement that is being provided.  His pain is also concerning. We discussed his history with opioid use and addiction. He states he was prescribed oxycodone for his back pain and  became addicted. Then it was abruptly stopped and he went through withdrawal.  The pain comes and goes, it comes on quickly and is hot, burning. He gets angry and admits to being a little mean when he's in pain. He notes today that he was upset because he had received his pain and cough medication in anticipation of going to radiation, but the radiation treatment got delayed and his pain returned but he could not receive another dose before going to his treatment.  He also c/o not being able to sleep. He notes not having a good night of sleep in a few months. He is unable to fall asleep, wakes up in pain, is coughing.    Primary Decision  Maker PATIENT    SUMMARY OF RECOMMENDATIONS -Will increase IV morphine to q2hr prn so that can be given more frequently if radiation treatment gets rescheduled -Will revisit tomorrow and start po morphine for cough, SOB, and pain -Trazodone '50mg'$  qhs for sleep -Nutrition consult to discuss supplement -Could also consider morphine nebulizer prior to radiation treatment -Senna 1 po qhs for bowel prophylaxis due opioids     Code Status/Advance Care Planning:  Full code  Palliative Prophylaxis:   Frequent Pain Assessment  Additional Recommendations (Limitations, Scope, Preferences):  Full Scope Treatment  Prognosis:    Unable to determine  Discharge Planning: To Be Determined  Primary Diagnoses: Present on Admission: . Dyspnea . SVC syndrome . Lung mass . Melanoma ([place]) . Chronic back pain . Cough   I have reviewed the medical record, interviewed the patient and family, and examined the patient. The following aspects are pertinent.  Past Medical History:  Diagnosis Date  . Chronic back pain 1989   broke back  . Hyperlipidemia   . Hypertension   . Opiate addiction ([place])    remote  . Stroke [facility])    Social History   Socioeconomic History  . Marital status: Married    Spouse name: Not on file  . Number of children: Not on file  . Years of education: Not on file  . Highest education level: Not on file  Occupational History  . Not on file  Social Needs  . Financial resource strain: Not on file  . Food insecurity:    Worry: Not on file    Inability: Not on file  . Transportation needs:    Medical: Not on file    Non-medical: Not on file  Tobacco Use  . Smoking status: Former Smoker    Packs/day: 1.50    Years: 33.00    Pack years: 49.50    Last attempt to quit: 1999    Years since quitting: 20.7  . Smokeless tobacco: Never Used  Substance and Sexual Activity  . Alcohol use: Yes  . Drug use: Yes    Frequency: 3.0 times per week    Types:  Marijuana  . Sexual activity: Not on file  Lifestyle  . Physical activity:    Days per week: Not on file    Minutes per session: Not on file  . Stress: Not on file  Relationships  . Social connections:    Talks on phone: Not on file    Gets together: Not on file    Attends religious service: Not on file    Active member of club or organization: Not on file    Attends meetings of clubs or organizations: Not on file    Relationship status: Not on file  Other Topics Concern  .  Not on file  Social History Narrative  . Not on file   Family History  Problem Relation Age of Onset  . Breast cancer Mother        multiple types  . Lung cancer Maternal Aunt        nonsmoking  . Pancreatic cancer Maternal Aunt    Scheduled Meds: . amLODipine  5 mg Oral Daily  . chlorpheniramine-HYDROcodone  5 mL Oral Q12H  . feeding supplement (ENSURE ENLIVE)  237 mL Oral TID BM  . guaiFENesin-dextromethorphan  10 mL Oral Q4H  . mouth rinse  15 mL Mouth Rinse BID  . methylPREDNISolone (SOLU-MEDROL) injection  80 mg Intravenous Q12H  . traZODone  50 mg Oral QHS   Continuous Infusions: PRN Meds:.acetaminophen **OR** acetaminophen, HYDROcodone-acetaminophen, menthol-cetylpyridinium, morphine injection, ondansetron **OR** ondansetron (ZOFRAN) IV, phenol, polyethylene glycol Medications Prior to Admission:  Prior to Admission medications   Medication Sig Start Date End Date Taking? Authorizing Provider  amLODipine (NORVASC) 5 MG tablet Take 1 tablet (5 mg total) by mouth daily. 08/27/18 09/26/18 Yes Donne Hazel, MD  benzonatate (TESSALON) 100 MG capsule Take 1 capsule (100 mg total) by mouth 3 (three) times daily as needed for cough. 08/26/18  Yes Donne Hazel, MD  HYDROcodone-acetaminophen (NORCO/VICODIN) 5-325 MG tablet Take 1-2 tablets by mouth every 6 (six) hours as needed for moderate pain or severe pain. 08/26/18  Yes Donne Hazel, MD  Menthol 1.7 MG LOZG Use as directed 1 lozenge in the  mouth or throat daily as needed (cough).   Yes [provider]  docusate sodium (COLACE) 100 MG capsule Take 1 capsule (100 mg total) by mouth 2 (two) times daily. Patient not taking: Reported on 08/30/2018 08/26/18 09/25/18  Donne Hazel, MD   Allergies  Allergen Reactions  . Butterscotch Flavor Anaphylaxis and Shortness Of Breath   Review of Systems  Constitutional: Positive for activity change, appetite change and fatigue.  Respiratory: Positive for cough and shortness of breath.   Psychiatric/Behavioral: Positive for sleep disturbance.    Physical Exam  Vital Signs: BP 126/74 (BP Location: Left Arm)   Pulse (!) 123   Temp 97.6 F (36.4 C) (Oral)   Resp 20   Ht '6\' 1"'$  (1.854 m)   Wt 84 kg   SpO2 99%   BMI 24.43 kg/m  Pain Scale: 0-10 POSS *See Group Information*: S-Acceptable,Sleep, easy to arouse Pain Score: 7    SpO2: SpO2: 99 % O2 Device:SpO2: 99 % O2 Flow Rate: .O2 Flow Rate (L/min): 2 L/min  IO: Intake/output summary:   Intake/Output Summary (Last 24 hours) at 09/01/2018 1700 Last data filed at 09/01/2018 1343 Gross per 24 hour  Intake 600 ml  Output 760 ml  Net -160 ml    LBM: Last BM Date: 08/31/18 Baseline Weight: Weight: 81.6 kg Most recent weight: Weight: 84 kg     Palliative Assessment/Data: PPS: 50%     Thank you for this consult. Palliative medicine will continue to follow and assist as needed.   Time In: 1600 Time Out:1730 Time Total: 90 minutes Prolonged services billed: yes Greater than 50%  of this time was spent counseling and coordinating care related to the above assessment and plan.  Signed by: Mariana Kaufman, AGNP-C Palliative Medicine    Please contact Palliative Medicine Team phone at [phone] for questions and concerns.  For individual provider: See Shea Evans

## 2018-09-01 NOTE — Progress Notes (Signed)
   09/01/18 1701  Clinical Encounter Type  Visited With Patient  Visit Type Initial  Referral From Chaplain  Consult/Referral To [place] visited to introduce Pt. to spiritual care component of Palliative Care.  Chaplain heard Pt. describe differences in the care he looked for and care he received.  Pt. shared his 39th birthday has brought about a change in his quality of life that is hard for him to hold on to.  Kacie-NP entered during chaplain visit with Pt. Chaplain encouraged Pt. to share his questions and concerns with NP. Pt. sas reluctant at first but proceeded in the conversation with NP. Chaplain exited Pt. room to respond to a page and returned to close visit and hear Pt. describe his closest time with God is when he is fishing. The Pt. identified seeing a good snow fall may be the only thing left on his bucket list. The Pt. told the chaplain the only visits he wants are from the people who truly want to be present with him.  The chaplain thanked the Pt. for his honesty.

## 2018-09-02 ENCOUNTER — Ambulatory Visit
Admission: RE | Admit: 2018-09-02 | Discharge: 2018-09-02 | Disposition: A | Discharge status: Home / Self Care | Payer: Medicare Other | Source: Ambulatory Visit | Attending: Radiation Oncology | Admitting: Radiation Oncology

## 2018-09-02 DIAGNOSIS — R05 Cough: Secondary | ICD-10-CM

## 2018-09-02 DIAGNOSIS — I871 Compression of vein: Secondary | ICD-10-CM

## 2018-09-02 DIAGNOSIS — Z7189 Other specified counseling: Secondary | ICD-10-CM

## 2018-09-02 DIAGNOSIS — R0600 Dyspnea, unspecified: Secondary | ICD-10-CM

## 2018-09-02 DIAGNOSIS — R0602 Shortness of breath: Secondary | ICD-10-CM

## 2018-09-02 DIAGNOSIS — Z515 Encounter for palliative care: Secondary | ICD-10-CM

## 2018-09-02 DIAGNOSIS — C439 Malignant melanoma of skin, unspecified: Secondary | ICD-10-CM

## 2018-09-02 DIAGNOSIS — J9859 Other diseases of mediastinum, not elsewhere classified: Secondary | ICD-10-CM

## 2018-09-02 DIAGNOSIS — F5102 Adjustment insomnia: Secondary | ICD-10-CM

## 2018-09-02 MED ORDER — LEVALBUTEROL HCL 0.63 MG/3ML IN NEBU
0.6300 mg | INHALATION_SOLUTION | Freq: Three times a day (TID) | RESPIRATORY_TRACT | Status: DC
  Administered 2018-09-02 – 2018-09-05 (×8): 0.63 mg via RESPIRATORY_TRACT
  Filled 2018-09-02 (×8): qty 3

## 2018-09-02 MED ORDER — IPRATROPIUM-ALBUTEROL 0.5-2.5 (3) MG/3ML IN SOLN
3.0000 mL | Freq: Three times a day (TID) | RESPIRATORY_TRACT | Status: DC
  Administered 2018-09-02: 3 mL via RESPIRATORY_TRACT
  Filled 2018-09-02: qty 3

## 2018-09-02 MED ORDER — IPRATROPIUM-ALBUTEROL 0.5-2.5 (3) MG/3ML IN SOLN
3.0000 mL | Freq: Four times a day (QID) | RESPIRATORY_TRACT | Status: DC
  Administered 2018-09-02: 3 mL via RESPIRATORY_TRACT
  Filled 2018-09-02: qty 3

## 2018-09-02 MED ORDER — IPRATROPIUM-ALBUTEROL 0.5-2.5 (3) MG/3ML IN SOLN: 3.0000 mL | RESPIRATORY_TRACT | Status: DC | PRN

## 2018-09-02 NOTE — Evaluation (Signed)
Physical Therapy Evaluation Patient Details Name: Gerald Adkins MRN: [id number] DOB: Oct 06, 1956 Today's Date: 09/02/2018   History of Present Illness  62 yo male admitted with dyspnea. hx of SVC syndrome-lung mass, mediastinal mass, chronic back pain, CVA  Clinical Impression  On eval, pt was Min guard assist for mobility.He walked ~225 feet with a RW (for safety). O2 sat 91% 2L [place] O2 at rest, 87% 2L [place] O2 after ambulation. Will follow and progress activity as tolerated.     Follow Up Recommendations Home health PT    Equipment Recommendations  None recommended by PT    Recommendations for Other Services       Precautions / Restrictions Precautions Precautions: Fall Precaution Comments: monitor O2 sats Restrictions Weight Bearing Restrictions: No      Mobility  Bed Mobility Overal bed mobility: Modified Independent                Transfers Overall transfer level: Needs assistance   Transfers: Sit to/from Stand Sit to Stand: Supervision         General transfer comment: pt c/o lightheadedness. for safety  Ambulation/Gait Ambulation/Gait assistance: Min guard Gait Distance (Feet): 225 Feet Assistive device: Rolling walker (2 wheeled) Gait Pattern/deviations: Step-through pattern     General Gait Details: uses RW for safety. Mild lightheadedness. O2 sat 87% on 2L [place] after ambulation  Stairs            Wheelchair Mobility    Modified Rankin (Stroke Patients Only)       Balance                                             Pertinent Vitals/Pain Pain Assessment: Faces Faces Pain Scale: Hurts a little bit Pain Location: back Pain Descriptors / Indicators: Sore;Aching Pain Intervention(s): Monitored during session;Repositioned    Home Living Family/patient expects to be discharged to:: Private residence Living Arrangements: Spouse/significant other Available Help at Discharge: Family Type of Home: House         Home  Equipment: Kasandra Knudsen - single point      Prior Function Level of Independence: Independent               Hand Dominance        Extremity/Trunk Assessment   Upper Extremity Assessment Upper Extremity Assessment: LUE deficits/detail 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    Lower Extremity Assessment Lower Extremity Assessment: Generalized weakness    Cervical / Trunk Assessment Cervical / Trunk Assessment: Normal  Communication   Communication: No difficulties  Cognition Arousal/Alertness: Awake/alert Behavior During Therapy: WFL for tasks assessed/performed Overall Cognitive Status: Within Functional Limits for tasks assessed                                        General Comments      Exercises     Assessment/Plan    PT Assessment Patient needs continued PT services  PT Problem List Decreased mobility;Decreased activity tolerance;Decreased knowledge of use of DME       PT Treatment Interventions DME instruction;Gait training;Functional mobility training;Therapeutic activities;Balance training;Patient/family education;Therapeutic exercise    PT Goals (Current goals can be found in the Care Plan section)  Acute Rehab PT Goals Patient Stated Goal: none stated PT Goal Formulation: With patient  Time For Goal Achievement: 09/16/18 Potential to Achieve Goals: Good    Frequency Min 3X/week   Barriers to discharge        Co-evaluation               AM-PAC PT "6 Clicks" Daily Activity  Outcome Measure Difficulty turning over in bed (including adjusting bedclothes, sheets and blankets)?: None Difficulty moving from lying on back to sitting on the side of the bed? : None Difficulty sitting down on and standing up from a chair with arms (e.g., wheelchair, bedside commode, etc,.)?: A Little Help needed moving to and from a bed to chair (including a wheelchair)?: A Little Help needed walking in hospital room?: A Little Help needed  climbing 3-5 steps with a railing? : A Little 6 Click Score: 20    End of Session Equipment Utilized During Treatment: Gait belt;Oxygen Activity Tolerance: Patient tolerated treatment well Patient left: in bed;with call bell/phone within reach;with bed alarm set   PT Visit Diagnosis: Muscle weakness (generalized) (M62.81);Unsteadiness on feet (R26.81)    Time: 0375-4360 PT Time Calculation (min) (ACUTE ONLY): 26 min   Charges:   PT Evaluation $PT Eval Moderate Complexity: 1 Mod PT Treatments $Gait Training: 8-22 mins          Weston Anna, PT Acute Rehabilitation Services Pager: [phone] Office: [phone]

## 2018-09-02 NOTE — Progress Notes (Signed)
PROGRESS NOTE    SANTE BIEDERMANN  RJJ:[id number] DOB: Mar 17, 1956 DOA: 08/30/2018 PCP: [name], MD    Brief Narrative:  62 y.o. 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  Patient reports when he is lying back or just reclining difficulty breathing becomes so severe he feels he is choking.  He is not on home O2.  He had some lower extremity swelling earlier in the week which has resolved today, he has improving but persistent swelling in his right upper extremity. Chronic right upper chest pain.    Recent hospital admission  9/22- 08/26/18-patient presented with facial swelling, thoracic CT showed large mediastinal mass invading and narrowing the superior vena cava.  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  Patient completed 3 rounds of radiation treatment.  He was discharged to home, with O2 sats greater than 90% on room air.   Patient presented for radiation treatment on 9/30, was unable to lay flat for radiation treatment, patient became tachycardic and tachypneic, was placed on 2 L O2, and sent to [facility] long ED.  ED Course: Persistent tachycardia mostly 110s.  Intermittent tachypnea to 31, O2 sats greater than 97% on nasal cannula.  Two-view chest x-ray showed new right lung base opacity most likely atelectasis, consider pneumonia.  Patient was started on IV ceftriaxone and azithromycin in the ED hospitalist was called to admit.   Hospital course: Stat CTA chest-similar sized large mediastinal mass with significant narrowing of right lower lobe pulmonary artery. NO embolus.  Bulky mediastinal and hilar adenopathy, marked mass-effect on lower trachea by mediastinal mass. CTS was consulted, recommendation for IV steroids. Radiation oncology also consulted.  Pt's SOB and cough  are improving with iv steroids, bronchodilator and palliative radiation therapy.    Assessment & Plan:   Principal Problem:   SVC syndrome Active Problems:   Dyspnea   Lung mass   Melanoma ([facility])   Chronic back pain   Cough   Mediastinal mass   Goals of care, counseling/discussion   Palliative care by specialist   Advance care planning   Adjustment insomnia  Dyspnea - Stat CTA chest-similar sized large mediastinal mass with significant narrowing of right lower lobe pulmonary artery. NO embolus.  Bulky mediastinal and hilar adenopathy, marked mass-effect on lower trachea by mediastinal mass. 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   -consult palliative care for goal of care, code status and symptomatic management  Intractable cough -continue Vicodin, Robitussin DM q4h and Tussionex -iv PRN morphine for cough and dyspnea relief  Chronic back pain- -continue morphine as tolerated  DVT prophylaxis: SCD's Code Status: Full Family Communication: Pt in room, family not in room Disposition Plan: Uncertain at this time  Consultants:   CTS; Radiation Oncology; palliative care  Procedures:     Antimicrobials: Anti-infectives (From admission, onward)   Start     Dose/Rate Route Frequency Ordered Stop   08/30/18 1830  cefTRIAXone (ROCEPHIN) 1 g in sodium chloride 0.9 % 100 mL IVPB     1 g 200  mL/hr over 30 Minutes Intravenous  Once 08/30/18 1821 08/30/18 2225   08/30/18 1830  azithromycin (ZITHROMAX) 500 mg in sodium chloride 0.9 % 250 mL IVPB     500 mg 250 mL/hr over 60 Minutes Intravenous  Once 08/30/18 1821  08/30/18 2000      Subjective: Feeling somewhat better for his breathing with iv solumedrol, nebulizer, and radiation therapy today   Objective: Vitals:   09/02/18 0856 09/02/18 0859 09/02/18 1314 09/02/18 1500  BP: (!) 164/89 (!) 164/89  (!) 155/91  Pulse:  (!) 122  (!) 126  Resp:    18  Temp:    97.8 F (36.6 C)  TempSrc:    Oral  SpO2:   97% 97%  Weight:      Height:        Intake/Output Summary (Last 24 hours) at 09/02/2018 1533 Last data filed at 09/02/2018 0900 Gross per 24 hour  Intake 0 ml  Output 650 ml  Net -650 ml   Filed Weights   08/30/18 2304 08/30/18 2355  Weight: 81.6 kg 84 kg    Examination:  General exam: distressed with cough  Respiratory system: Clear to auscultation but more diminished breath sound over the apex and base, better sound in the middle. No wheezing or rhonchi. Mild respiratory distress Cardiovascular system: S1 & S2 heard, tachycardia Gastrointestinal system: Abdomen is nondistended, soft and nontender. No organomegaly or masses felt. Normal bowel sounds heard. Central nervous system: Alert and oriented. No focal neurological deficits. Extremities: Symmetric 5 x 5 power. Skin: superficial vein dilation over the neck and upper chest Psychiatry: Judgement and insight appear normal. Mood & affect appropriate.   Data Reviewed: I have personally reviewed following labs and imaging studies  CBC: Recent Labs  Lab 08/30/18 1721  WBC 10.7*  NEUTROABS 9.5*  HGB 9.1*  HCT 30.5*  MCV 68.8*  PLT 338*   Basic Metabolic Panel: Recent Labs  Lab 08/30/18 1721  NA 132*  K 3.9  CL 92*  CO2 27  GLUCOSE 102*  BUN 13  CREATININE 0.56*  CALCIUM 8.6*   GFR: Estimated Creatinine Clearance: 111 mL/min (A) (by C-G formula based on SCr of 0.56 mg/dL (L)). Liver Function Tests: Recent Labs  Lab 08/30/18 1721  AST 21  ALT 17  ALKPHOS 64  BILITOT 0.6  PROT 6.6  ALBUMIN 3.1*   No results for input(s): LIPASE, AMYLASE in the last 168  hours. No results for input(s): AMMONIA in the last 168 hours. Coagulation Profile: No results for input(s): INR, PROTIME in the last 168 hours. Cardiac Enzymes: Recent Labs  Lab 08/30/18 1721  TROPONINI <0.03   BNP (last 3 results) No results for input(s): PROBNP in the last 8760 hours. HbA1C: No results for input(s): HGBA1C in the last 72 hours. CBG: Recent Labs  Lab 08/30/18 1501  GLUCAP 121*   Lipid Profile: No results for input(s): CHOL, HDL, LDLCALC, TRIG, CHOLHDL, LDLDIRECT in the last 72 hours. Thyroid Function Tests: No results for input(s): TSH, T4TOTAL, FREET4, T3FREE, THYROIDAB in the last 72 hours. Anemia Panel: No results for input(s): VITAMINB12, FOLATE, FERRITIN, TIBC, IRON, RETICCTPCT in the last 72 hours. Sepsis Labs: Recent Labs  Lab 08/30/18 1710 08/30/18 2016  LATICACIDVEN 2.89* 2.90*    Recent Results (from the past 240 hour(s))  MRSA PCR Screening     Status: None   Collection Time: 08/30/18 11:56 PM  Result Value Ref Range Status   MRSA by PCR NEGATIVE NEGATIVE Final  Comment:        The GeneXpert MRSA Assay (FDA approved for NASAL specimens only), is one component of a comprehensive MRSA colonization surveillance program. It is not intended to diagnose MRSA infection nor to guide or monitor treatment for MRSA infections. Performed at [facility], [place] [address] [place], [place] [address]      Radiology Studies: No results found.  Scheduled Meds: . amLODipine  5 mg Oral Daily  . chlorpheniramine-HYDROcodone  5 mL Oral Q12H  . feeding supplement (ENSURE ENLIVE)  237 mL Oral TID BM  . guaiFENesin-dextromethorphan  10 mL Oral Q4H  . levalbuterol  0.63 mg Nebulization TID  . mouth rinse  15 mL Mouth Rinse BID  . methylPREDNISolone (SOLU-MEDROL) injection  80 mg Intravenous Q12H  . senna  1 tablet Oral QHS  . traZODone  50 mg Oral QHS   Continuous Infusions:   LOS: 3 days   Paticia Stack, MD [facility] Pager On Amion  If 7PM-7AM, please contact night-coverage 09/02/2018, 3:33 PM

## 2018-09-03 ENCOUNTER — Other Ambulatory Visit: Payer: Self-pay

## 2018-09-03 ENCOUNTER — Ambulatory Visit
Admission: RE | Admit: 2018-09-03 | Discharge: 2018-09-03 | Disposition: A | Discharge status: Home / Self Care | Payer: Medicare Other | Source: Ambulatory Visit | Attending: Radiation Oncology | Admitting: Radiation Oncology

## 2018-09-03 DIAGNOSIS — D509 Iron deficiency anemia, unspecified: Secondary | ICD-10-CM | POA: Diagnosis present

## 2018-09-03 LAB — BASIC METABOLIC PANEL
ANION GAP: 13 (ref 5–15)
BUN: 18 mg/dL (ref 6–20)
CHLORIDE: 91 mmol/L — AB (ref 98–111)
CO2: 28 mmol/L (ref 22–32)
CREATININE: 0.58 mg/dL — AB (ref 0.61–1.24)
Calcium: 9 mg/dL (ref 8.9–10.3)
GFR calc non Af Amer: >60 mL/min (ref >60)
Glucose, Bld: 122 mg/dL — ABNORMAL HIGH (ref 70–99)
Potassium: 5 mmol/L (ref 3.5–5.1)
SODIUM: 132 mmol/L — AB (ref 135–145)

## 2018-09-03 LAB — CBC
HEMATOCRIT: 31.5 % — AB (ref 39.0–52.0)
HEMOGLOBIN: 9.1 g/dL — AB (ref 13.0–17.0)
MCH: 20.3 pg — ABNORMAL LOW (ref 26.0–34.0)
MCHC: 28.9 g/dL — AB (ref 30.0–36.0)
MCV: 70.3 fL — ABNORMAL LOW (ref 78.0–100.0)
Platelets: 309 10*3/uL (ref 150–400)
RBC: 4.48 MIL/uL (ref 4.22–5.81)
RDW: 21.8 % — ABNORMAL HIGH (ref 11.5–15.5)
WBC: 9.8 10*3/uL (ref 4.0–10.5)

## 2018-09-03 MED ORDER — FERROUS SULFATE 325 (65 FE) MG PO TABS
325.0000 mg | ORAL_TABLET | Freq: Two times a day (BID) | ORAL | Status: DC
  Administered 2018-09-03 – 2018-09-05 (×5): 325 mg via ORAL
  Filled 2018-09-03 (×5): qty 1

## 2018-09-03 MED ORDER — MORPHINE SULFATE 10 MG/5ML PO SOLN
5.0000 mg | ORAL | Status: DC | PRN
  Administered 2018-09-03 – 2018-09-04 (×5): 5 mg via ORAL
  Filled 2018-09-03 (×5): qty 5

## 2018-09-03 NOTE — Progress Notes (Signed)
Daily Progress Note   Patient Name: Gerald Adkins       Date: 09/03/2018 DOB: 1956-01-04  Age: 62 y.o. MRN#: [id number] Attending Physician: Paticia Stack, MD Primary Care Physician: [name], MD Admit Date: 08/30/2018  Reason for Consultation/Follow-up: Establishing goals of care, Non pain symptom management and Pain control  Subjective: Patient sitting in bed. Reports improvement in pain and cough control. Also reports improved sleep without associated daytime sleepiness.  We discussed his interest in holistic and homeopathic therapies. He also does not like to take medications that contain acetaminophen. He had a good BM yesterday. He is reviewing list of home health agencies for help at home. We discussed palliative followup at home and he is agreeable. We discussed transitioning him to oral pain medications in anticipation of discharging him home.  Wishes to continue full scope care and full code.  ROS  Length of Stay: 4  Current Medications: Scheduled Meds:  . amLODipine  5 mg Oral Daily  . chlorpheniramine-HYDROcodone  5 mL Oral Q12H  . ferrous sulfate  325 mg Oral BID WC  . guaiFENesin-dextromethorphan  10 mL Oral Q4H  . levalbuterol  0.63 mg Nebulization TID  . mouth rinse  15 mL Mouth Rinse BID  . methylPREDNISolone (SOLU-MEDROL) injection  80 mg Intravenous Q12H  . senna  1 tablet Oral QHS  . traZODone  50 mg Oral QHS    Continuous Infusions:   PRN Meds: acetaminophen **OR** acetaminophen, ipratropium-albuterol, menthol-cetylpyridinium, morphine, ondansetron **OR** ondansetron (ZOFRAN) IV, phenol, polyethylene glycol  Physical Exam  Constitutional: He appears well-developed and well-nourished.  HENT:  Head: Normocephalic and atraumatic.  Pulmonary/Chest:  Effort normal.  Musculoskeletal: He exhibits deformity.  L hand deformity  Skin: Skin is warm and dry.  Psychiatric: He has a normal mood and affect. His behavior is normal. Judgment and thought content normal.  Nursing note and vitals reviewed.           Vital Signs: BP 132/86 (BP Location: Left Arm)   Pulse (!) 108   Temp 97.7 F (36.5 C) (Oral)   Resp 20   Ht 6\' 1"  (1.854 m)   Wt 84 kg   SpO2 95%   BMI 24.43 kg/m  SpO2: SpO2: 95 % O2 Device: O2 Device: Nasal Cannula O2 Flow Rate: O2 Flow Rate (L/min):  2 L/min  Intake/output summary:   Intake/Output Summary (Last 24 hours) at 09/03/2018 1621 Last data filed at 09/03/2018 1146 Gross per 24 hour  Intake 360 ml  Output 1650 ml  Net -1290 ml   LBM: Last BM Date: 09/02/18 Baseline Weight: Weight: 81.6 kg Most recent weight: Weight: 84 kg       Palliative Assessment/Data: PPS: 60%   Flowsheet Rows     Most Recent Value  Intake Tab  Referral Department  Hospitalist  Unit at Time of Referral  ICU  Date Notified  09/01/18  Palliative Care Type  New Palliative care  Reason for referral  Non-pain Symptom  Date of Admission  08/30/18  Date first seen by Palliative Care  09/02/18  # of days Palliative referral response time  1 Day(s)  # of days IP prior to Palliative referral  2  Clinical Assessment  Psychosocial & Spiritual Assessment  Palliative Care Outcomes      Patient Active Problem List   Diagnosis Date Noted  . Iron deficiency anemia 09/03/2018  . Mediastinal mass   . Goals of care, counseling/discussion   . Palliative care by specialist   . Advance care planning   . Adjustment insomnia   . Lung mass 09/01/2018  . Melanoma ([place]) 09/01/2018  . Chronic back pain 09/01/2018  . Cough 09/01/2018  . Dyspnea 08/30/2018  . 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 Workup with CTA revealed similar sized mediastinal mass with significant narrowing of RLL pulmonary artery, bulky mediastinal and hilar adenopathy, marked mass-effect of lower trachea by mediastinal mass.  Assessment/Recommendations/Plan   D/C IV morphine  Morphine concentrate 5mg  po q2hr for SOB, pain, or cough  Continued scheduled tussionex and robitussin  D/C prn hycocet solution in effort to consolidate different opioids- recommend using morphine concentrate in its place  Continue bowel prophylaxis  Goals of Care and Additional Recommendations:  Limitations on Scope of Treatment: Full Scope Treatment  Code Status:  Full code  Prognosis:   Unable to determine  Discharge Planning:  Home with Palliative Services  Care plan was discussed with patient.  Thank you for allowing the Palliative Medicine Team to assist in the care of this patient.   Time In: 1545 Time Out: 1630 Total Time 45 mins Prolonged Time Billed no      Greater than 50%  of this time was spent counseling and coordinating care related to the above assessment and plan.  Mariana Kaufman, AGNP-C Palliative Medicine   Please contact Palliative Medicine Team phone at [phone] for questions and concerns.

## 2018-09-03 NOTE — Care Management Note (Signed)
Case Management Note  Patient Details  Name: Gerald Adkins MRN: [id number] Date of Birth: 04/01/1956  Subjective/Objective: PT recc HHC-Provided patient w/HHC agency list-await choice.                   Action/Plan:dc home w/HHC.   Expected Discharge Date:                  Expected Discharge Plan:  [place]  In-House Referral:     Discharge planning Services  CM Consult  Post Acute Care Choice:    Choice offered to:  Patient  DME Arranged:    DME Agency:     HH Arranged:    [place] Agency:     Status of Service:  In process, will continue to follow  If discussed at Long Length of Stay Meetings, dates discussed:    Additional Comments:  Dessa Phi, RN 09/03/2018, 12:28 PM

## 2018-09-03 NOTE — Progress Notes (Signed)
Physical Therapy Treatment Patient Details Name: Gerald Adkins MRN: [id number] DOB: 05-Jan-1956 Today's Date: 09/03/2018    History of Present Illness 62 yo male admitted with dyspnea. hx of SVC syndrome-lung mass, mediastinal mass, chronic back pain, CVA    PT Comments    Pt progressing toward PT goals; incr gait distance/tolerance today; maintained sats > 95% on 2L; continue POC  Follow Up Recommendations  Home health PT     Equipment Recommendations  None recommended by PT    Recommendations for Other Services       Precautions / Restrictions Precautions Precautions: Fall Precaution Comments: monitor O2 sats Restrictions Weight Bearing Restrictions: No    Mobility  Bed Mobility Overal bed mobility: Modified Independent                Transfers Overall transfer level: Needs assistance Equipment used: Rolling walker (2 wheeled) Transfers: Sit to/from Stand Sit to Stand: Supervision         General transfer comment: pt c/o lightheadedness. for safety and hand placement  Ambulation/Gait Ambulation/Gait assistance: Min guard Gait Distance (Feet): 240 Feet Assistive device: Rolling walker (2 wheeled) Gait Pattern/deviations: Step-through pattern     General Gait Details: uses RW for safety. O2 sats 95% on 2L [place] before/after/during ambulation; HR 120s at rest, max of 138 with amb; 3 standing rests d/t DOE   Marine [employer] [place] (Stroke Patients Only)       Balance Overall balance assessment: Mild deficits observed, not formally tested                                          Cognition Arousal/Alertness: Awake/alert Behavior During Therapy: WFL for tasks assessed/performed Overall Cognitive Status: Within Functional Limits for tasks assessed                                        Exercises      General Comments        Pertinent Vitals/Pain Pain Assessment:  No/denies pain    Home Living                      Prior Function            PT Goals (current goals can now be found in the care plan section) Acute Rehab PT Goals Patient Stated Goal: none stated PT Goal Formulation: With patient Time For Goal Achievement: 09/16/18 Potential to Achieve Goals: Good Progress towards PT goals: Progressing toward goals    Frequency    Min 3X/week      PT Plan Current plan remains appropriate    Co-evaluation              AM-PAC PT "6 Clicks" Daily Activity  Outcome Measure  Difficulty turning over in bed (including adjusting bedclothes, sheets and blankets)?: None Difficulty moving from lying on back to sitting on the side of the bed? : None Difficulty sitting down on and standing up from a chair with arms (e.g., wheelchair, bedside commode, etc,.)?: A Little Help needed moving to and from a bed to chair (including a wheelchair)?: A Little Help needed walking in hospital room?: A Little Help needed climbing 3-5 steps  with a railing? : A Little 6 Click Score: 20    End of Session Equipment Utilized During Treatment: Gait belt;Oxygen Activity Tolerance: Patient tolerated treatment well Patient left: in bed;with call bell/phone within reach;with bed alarm set   PT Visit Diagnosis: Muscle weakness (generalized) (M62.81);Unsteadiness on feet (R26.81)     Time: 2353-6144 PT Time Calculation (min) (ACUTE ONLY): 26 min  Charges:  $Gait Training: 23-37 mins                     Kenyon Ana, [place] Pager: [phone] 09/03/2018   [facility] [phone]    [facility] 09/03/2018, 12:50 PM

## 2018-09-03 NOTE — Progress Notes (Signed)
Not appropriate for [facility] d/t palliative xrt. Current d/c plan home w/HHC.

## 2018-09-03 NOTE — Care Management Note (Signed)
Case Management Note  Patient Details  Name: [name] MRN: [id number] Date of Birth: Apr 20, 1956  Subjective/Objective:  Patient for LTAC screening-SVC syndrome,lung mass,iv solumedrol,nebs,02, getting palliative xrt-await response.  PT recc HHPT.                 Action/Plan:d/c home.   Expected Discharge Date:                  Expected Discharge Plan:  Home/Self Care  In-House Referral:     Discharge planning Services  CM Consult  Post Acute Care Choice:    Choice offered to:     DME Arranged:    DME Agency:     HH Arranged:    HH Agency:     Status of Service:  In process, will continue to follow  If discussed at Long Length of Stay Meetings, dates discussed:    Additional Comments:  Dessa Phi, RN 09/03/2018, 11:19 AM

## 2018-09-03 NOTE — Care Management Important Message (Signed)
Important Message  Patient Details  Name: Gerald Adkins MRN: [id number] Date of Birth: Feb 15, 1956   Medicare Important Message Given:  Yes    Kerin Salen 09/03/2018, 11:09 AMImportant Message  Patient Details  Name: Gerald Adkins MRN: [id number] Date of Birth: 05/21/1956   Medicare Important Message Given:  Yes    Kerin Salen 09/03/2018, 11:09 AM

## 2018-09-03 NOTE — Progress Notes (Signed)
Nutrition Follow-up  DOCUMENTATION CODES:   Not applicable  INTERVENTION:  Add snacks in between meals as pt denies ONS offered  Diet liberalization to regular to increase PO intake   NUTRITION DIAGNOSIS:   Inadequate oral intake related to cancer and cancer related treatments(radiation; 3 treatments) as evidenced by energy intake < 75% for > 7 days.   GOAL:   Patient will meet greater than or equal to 90% of their needs  MONITOR:   PO intake, Supplement acceptance, Labs, Weight trends  REASON FOR ASSESSMENT:   Malnutrition Screening Tool    ASSESSMENT:  Pt admitted dyspnea, mediastinal and hilar adenopathy w/ chronic rt upper chest pain. 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    Consult placed by provider d/t pt concern of ONS containing artificial sweeteners. Upon previous visit on 10/1 pt endorsed using Boost at home when experiencing loss of appetite.    Pt reports the ONS provided to him was for diabetics after reading the nutrition label. I explained to the pt that there were no all natural sugars ONS offered in the hospital. Pt open to having a snack of pears/peaches/mixed fruit and peanut butter/jelly sandwich.   Pt reports a heart healthy diet at home and would like to continue at hospital, but was limited to choices w/ current heart healthy diet order. Will suggest liberalizing diet to regular to provider to encourage PO intake and increase protein options/intake at meal times.    Labs Reviewed:Glucose 122(H)  Na 132(L), Cr 0.58(L) Cl 91(L) Medications Reviewed: methylprednisolone, Tussionex, ferrous sulfate, morphine   Diet Order:  100% x 1 recorded meal Diet Order            Diet Heart Room service appropriate? Yes; Fluid consistency: Thin  Diet effective now              EDUCATION NEEDS:   No education needs have been identified at this time  Skin:  Skin Assessment: Reviewed RN  Assessment(abrasion, ecchymosis; rt hand  Incision (closed); rt neck)  Last BM:  10/3; Type 6 Brown, Medium  Height:   Ht Readings from Last 1 Encounters:  08/30/18 6\' 1"  (1.854 m)    Weight:   Wt Readings from Last 1 Encounters:  08/30/18 84 kg    Ideal Body Weight:  84 kg  BMI:  Body mass index is 24.43 kg/m.  Estimated Nutritional Needs:   Kcal:  2100-2500 (25-30kcal/kg)  Protein:  109-126 (1.3-1.5g/kg)  Fluid:  1.5L    Lajuan Lines, RD, LDN  After Hours/Weekend Pager: [phone]

## 2018-09-03 NOTE — Progress Notes (Addendum)
PROGRESS NOTE    Gerald Adkins  NGE:[id number] DOB: [birth year] DOA: 08/30/2018 PCP: [name], MD    Brief Narrative:  62 y.o. 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  Patient reports when he is lying back or just reclining difficulty breathing becomes so severe he feels he is choking.  He is not on home O2.  He had some lower extremity swelling earlier in the week which has resolved today, he has improving but persistent swelling in his right upper extremity. Chronic right upper chest pain.    Recent hospital admission  9/22- 08/26/18-patient presented with facial swelling, thoracic CT showed large mediastinal mass invading and narrowing the superior vena cava.  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  Patient completed 3 rounds of radiation treatment.  He was discharged to home, with O2 sats greater than 90% on room air.   Patient presented for radiation treatment on 9/30, was unable to lay flat for radiation treatment, patient became tachycardic and tachypneic, was placed on 2 L O2, and sent to [facility] ED.  ED Course: Persistent tachycardia mostly 110s.  Intermittent tachypnea to 31, O2 sats greater than 97% on nasal cannula.  Two-view chest x-ray showed new right lung base opacity most likely atelectasis, consider pneumonia.  Patient was started on IV ceftriaxone and azithromycin in the ED hospitalist was called to admit.   Hospital course: Stat CTA chest-similar sized large mediastinal mass with significant narrowing of right lower lobe pulmonary artery. NO embolus.  Bulky mediastinal and hilar adenopathy, marked mass-effect on lower trachea by mediastinal mass. CTS was consulted, recommendation for IV steroids. Radiation oncology also consulted.  Pt's SOB and cough  are improving with iv steroids, bronchodilator and palliative radiation therapy. Pt is scheduled to have another radiation therapy on Monday. Discharge plan needs to coordinate with radiation oncology and depending on his clinical response.   On iron supplement due to iron deficiency anemia   Assessment & Plan:   Principal Problem:   SVC syndrome Active Problems:   Dyspnea   Lung mass   Melanoma ([facility])   Chronic back pain   Cough   Mediastinal mass   Goals of care, counseling/discussion   Palliative care by specialist   Advance care planning   Adjustment insomnia   Iron deficiency anemia  Dyspnea - CTA chest-similar sized large mediastinal mass with significant narrowing of right lower lobe pulmonary artery. NO embolus.  Bulky mediastinal and hilar adenopathy, marked mass-effect on lower trachea by mediastinal mass. 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   -consult palliative care for goal of care, code status and symptomatic management  Intractable cough -continue Vicodin, Robitussin DM q4h and Tussionex -iv PRN morphine for cough and dyspnea relief  Chronic back pain- -continue morphine as tolerated  Iron deficiency anemia -add ferrous sulfate bid with meals   DVT prophylaxis: SCD's Code Status: Full Family Communication: Pt in room, family not in room Disposition Plan: Uncertain at this time; not a LTAC  candidate due to radiation therapy  Consultants:   CTS; Radiation Oncology; palliative care  Procedures:     Antimicrobials: Anti-infectives (From admission, onward)    Start     Dose/Rate Route Frequency Ordered Stop   08/30/18 1830  cefTRIAXone (ROCEPHIN) 1 g in sodium chloride 0.9 % 100 mL IVPB     1 g 200 mL/hr over 30 Minutes Intravenous  Once 08/30/18 1821 08/30/18 2225   08/30/18 1830  azithromycin (ZITHROMAX) 500 mg in sodium chloride 0.9 % 250 mL IVPB     500 mg 250 mL/hr over 60 Minutes Intravenous  Once 08/30/18 1821 08/30/18 2000      Subjective: Feeling somewhat better for his breathing with iv solumedrol, nebulizer, and radiation therapy; stated he can lower his bed to 45 degree for sleep. He walks with PT and recommended to have [place] PT.   Objective: Vitals:   09/02/18 1954 09/03/18 0518 09/03/18 0737 09/03/18 0741  BP:  132/86    Pulse:  (!) 108    Resp:  20    Temp:  97.7 F (36.5 C)    TempSrc:  Oral    SpO2: 96% 98% 95% 95%  Weight:      Height:        Intake/Output Summary (Last 24 hours) at 09/03/2018 1410 Last data filed at 09/03/2018 1146 Gross per 24 hour  Intake 360 ml  Output 1650 ml  Net -1290 ml   Filed Weights   08/30/18 2304 08/30/18 2355  Weight: 81.6 kg 84 kg    Examination:  General exam: NAD  Respiratory system: Clear to auscultation but more diminished breath sound over the apex and base, better sound in the middle. No wheezing or rhonchi. Mild respiratory distress Cardiovascular system: S1 & S2 heard, tachycardia Gastrointestinal system: Abdomen is nondistended, soft and nontender. No organomegaly or masses felt. Normal bowel sounds heard. Central nervous system: Alert and oriented. No focal neurological deficits. Extremities: Symmetric 5 x 5 power. Skin: superficial vein dilation over the neck and upper chest Psychiatry: Judgement and insight appear normal. Mood & affect appropriate.   Data Reviewed: I have personally reviewed following labs and imaging studies  CBC: Recent Labs  Lab 08/30/18 1721 09/03/18 0427  WBC 10.7* 9.8  NEUTROABS 9.5*  --   HGB 9.1* 9.1*  HCT 30.5* 31.5*  MCV 68.8*  70.3*  PLT 435* 119   Basic Metabolic Panel: Recent Labs  Lab 08/30/18 1721 09/03/18 0427  NA 132* 132*  K 3.9 5.0  CL 92* 91*  CO2 27 28  GLUCOSE 102* 122*  BUN 13 18  CREATININE 0.56* 0.58*  CALCIUM 8.6* 9.0   GFR: Estimated Creatinine Clearance: 111 mL/min (A) (by C-G formula based on SCr of 0.58 mg/dL (L)). Liver Function Tests: Recent Labs  Lab 08/30/18 1721  AST 21  ALT 17  ALKPHOS 64  BILITOT 0.6  PROT 6.6  ALBUMIN 3.1*   No results for input(s): LIPASE, AMYLASE in the last 168 hours. No results for input(s): AMMONIA in the last 168 hours. Coagulation Profile: No results for input(s): INR, PROTIME in the last 168 hours. Cardiac Enzymes: Recent Labs  Lab 08/30/18 1721  TROPONINI <0.03   BNP (last 3 results) No results for input(s): PROBNP in the last 8760 hours. HbA1C: No results for input(s): HGBA1C in the last 72 hours. CBG: Recent Labs  Lab 08/30/18 1501  GLUCAP 121*   Lipid Profile: No results for input(s): CHOL, HDL, LDLCALC, TRIG, CHOLHDL, LDLDIRECT in the  last 72 hours. Thyroid Function Tests: No results for input(s): TSH, T4TOTAL, FREET4, T3FREE, THYROIDAB in the last 72 hours. Anemia Panel: No results for input(s): VITAMINB12, FOLATE, FERRITIN, TIBC, IRON, RETICCTPCT in the last 72 hours. Sepsis Labs: Recent Labs  Lab 08/30/18 1710 08/30/18 2016  LATICACIDVEN 2.89* 2.90*    Recent Results (from the past 240 hour(s))  MRSA PCR Screening     Status: None   Collection Time: 08/30/18 11:56 PM  Result Value Ref Range Status   MRSA by PCR NEGATIVE NEGATIVE Final    Comment:        The GeneXpert MRSA Assay (FDA approved for NASAL specimens only), is one component of a comprehensive MRSA colonization surveillance program. It is not intended to diagnose MRSA infection nor to guide or monitor treatment for MRSA infections. Performed at [facility], [place] [address], [place], [place] [address]      Radiology  Studies: No results found.  Scheduled Meds: . amLODipine  5 mg Oral Daily  . chlorpheniramine-HYDROcodone  5 mL Oral Q12H  . ferrous sulfate  325 mg Oral BID WC  . guaiFENesin-dextromethorphan  10 mL Oral Q4H  . levalbuterol  0.63 mg Nebulization TID  . mouth rinse  15 mL Mouth Rinse BID  . methylPREDNISolone (SOLU-MEDROL) injection  80 mg Intravenous Q12H  . senna  1 tablet Oral QHS  . traZODone  50 mg Oral QHS   Continuous Infusions:   LOS: 4 days   Paticia Stack, MD [facility] Pager On [place]  If 7PM-7AM, please contact night-coverage 09/03/2018, 2:10 PM

## 2018-09-04 MED ORDER — METHYLPREDNISOLONE SODIUM SUCC 125 MG IJ SOLR
60.0000 mg | Freq: Two times a day (BID) | INTRAMUSCULAR | Status: DC
  Administered 2018-09-04 – 2018-09-05 (×2): 60 mg via INTRAVENOUS
  Filled 2018-09-04 (×2): qty 2

## 2018-09-04 NOTE — Progress Notes (Signed)
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 Hewas discharged to home, withO2 sats greater than 90% on room air.  Patient presented for radiation treatment on 9/30,was unable to lay flatfor radiation treatment,patient became tachycardic and tachypneic,was placed on 2 L O2,and sent to [facility] ED.  ED Course:Persistent tachycardia mostly 110s.Intermittent tachypnea to 31,O2 sats greater than 97% on nasal cannula.Two-view chest x-ray showed newrightlung baseopacity most likely atelectasis,consider pneumonia.Patient was started on IV ceftriaxone and azithromycin in the ED hospitalist was called to admit Hospital course: Stat CTA chest-similar sized large mediastinal mass with significant narrowing of right lower lobe pulmonary artery. NOembolus.Bulky mediastinal and hilar adenopathy,marked mass-effect on lower trachea by mediastinal mass. CTS was consulted, recommendation for IV steroids. Radiation oncology also consulted.  Pt's SOB and cough are  improving with iv steroids, bronchodilator and palliative radiation therapy. Pt is scheduled to have another radiation therapy on Monday. Discharge plan needs to coordinate with radiation oncology and depending on his clinical response.   On iron supplement due to iron deficiency anemia     Assessment & Plan:   Principal Problem:   SVC syndrome Active Problems:   Dyspnea   Lung mass   Melanoma (HCC)   Chronic back pain   Cough   Mediastinal mass   Goals of care, counseling/discussion   Palliative care by specialist   Advance care planning   Adjustment insomnia   Iron deficiency anemia  Dyspnea -CTA chest-similar sized large mediastinal mass with significant narrowing of right lower lobe pulmonary artery. NOembolus.Bulky mediastinal and hilar adenopathy,marked mass-effect on lower trachea by mediastinal mass. 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 -Appreciate palliative care input.  Intractable cough -continue Vicodin, Robitussin DM q4h and Tussionex -iv PRN morphine for cough and dyspnea relief  Chronic back pain- -continue morphine as tolerated  Hyponatremia multifactorial follow-up levels tomorrow.   DVT prophylaxis: Lovenox Code Status: Full code Family Communication none Disposition Plan: TBD Consultants: Radiation oncology, CT surgery, palliative care  Procedures: None Antimicrobials: None  Subjective: Patient complains of shortness of breath and cough hard to ambulate.  He is used walking many miles during the day.  He reports having a bad night.  With increasing cough and shortness of breath and  O2 dependent.  Objective: Vitals:   09/04/18 0515 09/04/18 0845 09/04/18 0848 09/04/18 1234  BP: (!) 145/84   140/79  Pulse: (!) 112   (!) 120  Resp: 20   16  Temp: 98 F (  36.7 C)   98.1 F (36.7 C)  TempSrc: Oral   Oral  SpO2: 95% 96% 96% 95%  Weight:      Height:        Intake/Output Summary (Last 24 hours) at 09/04/2018 1355 Last data filed at 09/04/2018 1105 Gross per 24 hour  Intake 600 ml  Output 1625 ml  Net -1025 ml   Filed Weights   08/30/18 2304 08/30/18 2355  Weight: 81.6 kg 84 kg    Examination:  General exam: Appears calm and comfortable  Respiratory system:  coarse scattered bilateral wheezing to auscultation. Respiratory effort normal. Cardiovascular system: S1 & S2 heard, RRR. No JVD, murmurs, rubs, gallops or clicks. No pedal edema. Gastrointestinal system: Abdomen is nondistended, soft and nontender. No organomegaly or masses felt. Normal bowel sounds heard. Central nervous system: Alert and oriented. No focal neurological deficits. Extremities: Symmetric 5 x 5 power. Skin: No rashes, lesions or ulcers Psychiatry: Judgement and insight appear normal. Mood & affect appropriate.     Data Reviewed: I have personally reviewed following labs and imaging studies  CBC: Recent Labs  Lab 08/30/18 1721 09/03/18 0427  WBC 10.7* 9.8  NEUTROABS 9.5*  --   HGB 9.1* 9.1*  HCT 30.5* 31.5*  MCV 68.8* 70.3*  PLT 435* 101   Basic Metabolic Panel: Recent Labs  Lab 08/30/18 1721 09/03/18 0427  NA 132* 132*  K 3.9 5.0  CL 92* 91*  CO2 27 28  GLUCOSE 102* 122*  BUN 13 18  CREATININE 0.56* 0.58*  CALCIUM 8.6* 9.0   GFR: Estimated Creatinine Clearance: 111 mL/min (A) (by C-G formula based on SCr of 0.58 mg/dL (L)). Liver Function Tests: Recent Labs  Lab 08/30/18 1721  AST 21  ALT 17  ALKPHOS 64  BILITOT 0.6  PROT 6.6  ALBUMIN 3.1*   No results for input(s): LIPASE, AMYLASE in the last 168 hours. No results for input(s): AMMONIA in the last  168 hours. Coagulation Profile: No results for input(s): INR, PROTIME in the last 168 hours. Cardiac Enzymes: Recent Labs  Lab 08/30/18 1721  TROPONINI <0.03   BNP (last 3 results) No results for input(s): PROBNP in the last 8760 hours. HbA1C: No results for input(s): HGBA1C in the last 72 hours. CBG: Recent Labs  Lab 08/30/18 1501  GLUCAP 121*   Lipid Profile: No results for input(s): CHOL, HDL, LDLCALC, TRIG, CHOLHDL, LDLDIRECT in the last 72 hours. Thyroid Function Tests: No results for input(s): TSH, T4TOTAL, FREET4, T3FREE, THYROIDAB in the last 72 hours. Anemia Panel: No results for input(s): VITAMINB12, FOLATE, FERRITIN, TIBC, IRON, RETICCTPCT in the last 72 hours. Sepsis Labs: Recent Labs  Lab 08/30/18 1710 08/30/18 2016  LATICACIDVEN 2.89* 2.90*    Recent Results (from the past 240 hour(s))  MRSA PCR Screening     Status: None   Collection Time: 08/30/18 11:56 PM  Result Value Ref Range Status   MRSA by PCR NEGATIVE NEGATIVE Final    Comment:        The GeneXpert MRSA Assay (FDA approved for NASAL specimens only), is one component of a comprehensive MRSA colonization surveillance program. It is not intended to diagnose MRSA infection nor to guide or monitor treatment for MRSA infections. Performed at [facility], [place] [address], [place], [place] [address]          Radiology Studies: No results found.      Scheduled Meds: . amLODipine  5 mg Oral Daily  . chlorpheniramine-HYDROcodone  5  mL Oral Q12H  . ferrous sulfate  325 mg Oral BID WC  . guaiFENesin-dextromethorphan  10 mL Oral Q4H  . levalbuterol  0.63 mg Nebulization TID  . mouth rinse  15 mL Mouth Rinse BID  . methylPREDNISolone (SOLU-MEDROL) injection  80 mg Intravenous Q12H  . senna  1 tablet Oral QHS  . traZODone  50 mg Oral QHS   Continuous Infusions:   LOS: 5 days     Georgette Shell, MD [facility]  If 7PM-7AM, please contact  night-coverage [email or web address] Password [email or web address] 09/04/2018, 1:55 PM

## 2018-09-05 MED ORDER — PREDNISONE 10 MG PO TABS: ORAL_TABLET | ORAL | 0 refills | Status: DC

## 2018-09-05 MED ORDER — ONDANSETRON HCL 4 MG PO TABS: 4.0000 mg | ORAL_TABLET | Freq: Four times a day (QID) | ORAL | 0 refills | Status: DC | PRN

## 2018-09-05 MED ORDER — AMLODIPINE BESYLATE 5 MG PO TABS: 5.0000 mg | ORAL_TABLET | Freq: Every day | ORAL | 0 refills | Status: DC

## 2018-09-05 MED ORDER — POLYETHYLENE GLYCOL 3350 17 G PO PACK: 17.0000 g | PACK | Freq: Every day | ORAL | 0 refills | Status: DC | PRN

## 2018-09-05 MED ORDER — GUAIFENESIN-DM 100-10 MG/5ML PO SYRP: 10.0000 mL | ORAL_SOLUTION | ORAL | 0 refills | Status: DC

## 2018-09-05 MED ORDER — FERROUS SULFATE 325 (65 FE) MG PO TABS: 325.0000 mg | ORAL_TABLET | Freq: Two times a day (BID) | ORAL | 3 refills | Status: DC

## 2018-09-05 MED ORDER — HYDROCOD POLST-CPM POLST ER 10-8 MG/5ML PO SUER: 5.0000 mL | Freq: Two times a day (BID) | ORAL | 0 refills | Status: DC

## 2018-09-05 MED ORDER — SENNA 8.6 MG PO TABS: 1.0000 | ORAL_TABLET | Freq: Every day | ORAL | 0 refills | Status: DC

## 2018-09-05 NOTE — Discharge Summary (Signed)
Physician Discharge Summary  Gerald Adkins KXF:[id number] DOB: [birth year] DOA: 08/30/2018  PCP: [name], MD  Admit date: 08/30/2018 Discharge date: 09/05/2018  Admitted From: Home Disposition: Home Recommendations for Outpatient Follow-up:  1. Follow up with PCP in 1-2 weeks 2. Please obtain BMP/CBC in one week 3. Follow-up with radiation oncology 09/06/2018 4. 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 Hewas discharged to home, withO2 sats greater than 90% on room air.  Patient presented for radiation treatment on 9/30,was unable to lay flatfor radiation treatment,patient became tachycardic and tachypneic,was placed on 2 L O2,and sent to [facility] long ED.  ED Course:Persistent tachycardia mostly 110s.Intermittent tachypnea to 31,O2 sats greater than 97% on nasal cannula.Two-view chest x-ray showed newrightlung baseopacity most likely atelectasis,consider pneumonia.Patient was started on IV  ceftriaxone and azithromycin in the ED hospitalist was called to admit  Hospital course: Stat CTA chest-similar sized large mediastinal mass with significant narrowing of right lower lobe pulmonary artery. NOembolus.Bulky mediastinal and hilar adenopathy,marked mass-effect on lower trachea by mediastinal mass. CTS was consulted, recommendation for IV steroids. Radiation oncology also consulted.  Pt's SOB and cough are improving with iv steroids, bronchodilator and palliative radiation therapy. Pt is scheduled to have another radiation therapy on Monday. Discharge plan needs to coordinate with radiation oncology and depending on his clinical response.   On iron supplement due to iron deficiency anemia    Discharge Diagnoses:  Principal Problem:   SVC syndrome Active Problems:   Dyspnea   Lung mass   Melanoma (HCC)   Chronic back pain   Cough   Mediastinal mass   Goals of care, counseling/discussion   Palliative care by specialist   Advance care planning   Adjustment insomnia   Iron deficiency anemia   Dyspnea -CTA chest-similar sized large mediastinal mass with significant narrowing of right lower lobe pulmonary artery. NOembolus.Bulky mediastinal and hilar adenopathy,marked mass-effect on lower trachea by mediastinal mass. - CTS was consulted at time of presentation, recommendation for IV steroids, presently on minimal O2 support;  patient does have oxygen at home at 3 L 24 hours a day. -Rad-onc consulted and started palliative radiation therapy, next session on Monday -continue scheduled and PRN bronchodilator and steroid taper as an outpatient.  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 -Palliative care has been consulted and appreciate their input.    Intractable cough -continue Vicodin, Robitussin DM q4h and Tussionex -Resolved  Iron deficiency anemia -add ferrous  sulfate bid with meals  Discharge Instructions  Discharge Instructions    Call MD for:  difficulty breathing, headache or visual disturbances   Complete by:  As directed    Call MD for:  persistant nausea and vomiting   Complete  by:  As directed    Call MD for:  severe uncontrolled pain   Complete by:  As directed    Call MD for:  temperature >100.4   Complete by:  As directed    Diet - low sodium heart healthy   Complete by:  As directed    Increase activity slowly   Complete by:  As directed      Allergies as of 09/05/2018      Reactions   Butterscotch Flavor Anaphylaxis, Shortness Of Breath      Medication List    STOP taking these medications   docusate sodium 100 MG capsule Commonly known as:  COLACE     TAKE these medications   amLODipine 5 MG tablet Commonly known as:  NORVASC Take 1 tablet (5 mg total) by mouth daily.   benzonatate 100 MG capsule Commonly known as:  TESSALON Take 1 capsule (100 mg total) by mouth 3 (three) times daily as needed for cough.   chlorpheniramine-HYDROcodone 10-8 MG/5ML Suer Commonly known as:  TUSSIONEX Take 5 mLs by mouth every 12 (twelve) hours.   ferrous sulfate 325 (65 FE) MG tablet Take 1 tablet (325 mg total) by mouth 2 (two) times daily with a meal.   guaiFENesin-dextromethorphan 100-10 MG/5ML syrup Commonly known as:  ROBITUSSIN DM Take 10 mLs by mouth every 4 (four) hours.   HYDROcodone-acetaminophen 5-325 MG tablet Commonly known as:  NORCO/VICODIN Take 1-2 tablets by mouth every 6 (six) hours as needed for moderate pain or severe pain.   Menthol 1.7 MG Lozg Use as directed 1 lozenge in the mouth or throat daily as needed (cough).   ondansetron 4 MG tablet Commonly known as:  ZOFRAN Take 1 tablet (4 mg total) by mouth every 6 (six) hours as needed for nausea.   polyethylene glycol packet Commonly known as:  MIRALAX / GLYCOLAX Take 17 g by mouth daily as needed for mild constipation.   predniSONE 10 MG  tablet Commonly known as:  DELTASONE Take 3 tablets daily for the first 4 days then 2 tablets daily for the next 4 days and then 1 tablet daily till done   senna 8.6 MG Tabs tablet Commonly known as:  SENOKOT Take 1 tablet (8.6 mg total) by mouth at bedtime.      Follow-up Information    [name], MD Follow up.   Specialty:  Internal Medicine Contact information: [place] [address] [phone]          Allergies  Allergen Reactions  . Butterscotch Flavor Anaphylaxis and Shortness Of Breath    Consultations:  Radiation oncology, palliative care   Procedures/Studies: Dg Chest 2 View  Result Date: 08/30/2018 CLINICAL DATA:  Patient has been diagnosed recently with melanoma, and has been receiving radiation treatments. Patient was supposed to get radiation today, but patient could not lay flat due to SOB. Patient tachycardic and tachypnic in triage with tripoding EXAM: CHEST - 2 VIEW COMPARISON:  Chest CT and chest radiographs, 08/22/2018. FINDINGS: Large right mediastinal at contiguous right upper lobe mass, contiguous right hilar mass and smaller nodule above the left hilar structures in the left upper lobe are stable from the prior exams. There is opacity at the right lung base that is new from the prior chest radiograph and chest CT, likely atelectasis. Pneumonia is possible. Remainder of the lungs is clear. Cardiac silhouette is normal in size. No pleural effusion or pneumothorax. Skeletal structures are intact. IMPRESSION: 1. New right lung base  opacity, most likely atelectasis. Consider pneumonia if there are consistent clinical findings. 2. No other change from the prior chest CT and chest radiograph. Large right mediastinal and contiguous right hilar and upper lobe mass consistent with carcinoma. Presumed metastatic lung nodule in the central left upper lobe. Electronically Signed   By: Lajean Manes M.D.   On: 08/30/2018 16:48   Dg Chest 2  View  Result Date: 08/22/2018 CLINICAL DATA:  Acute shortness of breath. Fatigue and nausea/vomiting. EXAM: CHEST - 2 VIEW COMPARISON:  08/06/2006 FINDINGS: RIGHT mediastinal and RIGHT hilar masses are now identified. A 1.5 cm nodular opacity overlying the UPPER LEFT lung noted. The visualized lungs are otherwise clear. No pleural effusion or pneumothorax. No acute bony abnormalities are identified. IMPRESSION: New RIGHT mediastinal and RIGHT hilar masses and possible LEFT lung nodule. Chest CT with contrast recommended for further evaluation. Electronically Signed   By: Margarette Canada M.D.   On: 08/22/2018 12:31   Ct Angio Chest Pe W Or Wo Contrast  Result Date: 08/30/2018 CLINICAL DATA:  History of melanoma and lung mass EXAM: CT ANGIOGRAPHY CHEST WITH CONTRAST TECHNIQUE: Multidetector CT imaging of the chest was performed using the standard protocol during bolus administration of intravenous contrast. Multiplanar CT image reconstructions and MIPs were obtained to evaluate the vascular anatomy. CONTRAST:  152mL ISOVUE-370 IOPAMIDOL (ISOVUE-370) INJECTION 76% COMPARISON:  Chest x-ray 08/30/2018, CT 08/22/2018 FINDINGS: Cardiovascular: Satisfactory opacification of the pulmonary arteries to the segmental level. There is abrupt occlusion of the right upper lobe pulmonary artery by large mass lesion. The visible portions of enhancing right lower lobe pulmonary artery demonstrate no filling defects. No filling defect within the left-sided vasculature. There is significant narrowing of the right lower lobe pulmonary artery by mediastinal mass lesion and hilar mass. Coronary artery calcification. Normal heart size. Small pericardial effusion. Non enhancement of right brachiocephalic vein which is presumably occluded by tumor. There is narrowing of the left brachiocephalic vessel and subsequent occlusion of the left brachiocephalic vessel proximal to the confluence. There is narrowing of the superior vena cava with  occluded appearance of the upper aspect of the vena cava. Interim finding of extensive collateral vessels within the left chest wall. Best seen on coronal views are filling defects within the left subclavian, distal jugular, and brachiocephalic vessels, concerning for thrombus. Aortic atherosclerosis. No aneurysm. Tumor within the mediastinum partially surrounds the ascending aorta and proximal arch. Mediastinum/Nodes: Large mediastinal mass measuring 13.1 x 10.5 by 10.7 cm, grossly unchanged in size as compared with recent CT. Multiple enlarged AP window lymph nodes. Bulky subcarinal lymph node measuring 28 mm. Mediastinal mass is contiguous with bulky right hilar mass measuring 5.3 cm. Esophagus within normal limits. No thyroid mass. Tracheal deviation to the left by mass. Narrowed appearance of the tracheal bronchial bifurcation and right bronchus by tumor. Right supraclavicular node up to 2.7 cm. Hazy infiltration of the right axilla with multiple small nodes. Lungs/Pleura: Mild emphysematous disease. Multiple pulmonary nodules concerning for metastatic disease. Dominant nodule in the left upper lobe measuring 15 x 14 mm, not significantly changed. Increased small loculated right pleural effusion. Upper Abdomen: Abnormal enhancement of the central liver and caudate lobe, likely due to altered perfusion dynamics. Musculoskeletal: No acute or suspicious osseous abnormality. Review of the MIP images confirms the above findings. IMPRESSION: 1. Occlusion of the right upper pulmonary artery and branch vessels due to the presence of a large mediastinal mass. Significant narrowing of right lower lobe pulmonary artery as well. These findings  are similar as compared with 08/22/2018. No acute embolus visualized within the enhancing portions of the right middle and lower lobe pulmonary vessels nor the left-sided vasculature. 2. Large mediastinal mass with bulky mediastinal and hilar adenopathy. Interval finding of non  enhancement of right brachiocephalic vein with tumor occlusion of the upper aspect of the SVC. Interim development of extensive left chest wall and paraspinal collateral vessels. This is suspected to be secondary to thrombus/DVT within the left subclavian, distal jugular, and brachiocephalic veins. 3. Marked mass-effect on the lower trachea by mediastinal mass lesion with AP diameter of the distal trachea above the bifurcation measuring 7 mm. There is narrowing of the right greater than left bronchi as well. 4. Multiple bilateral pulmonary nodules concerning for metastatic disease, grossly unchanged. Large right supraclavicular lymph node concerning for metastatic disease. 5. Increased small slightly loculated right posterior pleural effusion. 6. Abnormal enhancement pattern of the liver, presumably due to altered perfusion dynamics from extensive collateral vasculature. Aortic Atherosclerosis (ICD10-I70.0) and Emphysema (ICD10-J43.9). Electronically Signed   By: Donavan Foil M.D.   On: 08/30/2018 21:17   Ct Angio Chest Pe W And/or Wo Contrast  Result Date: 08/22/2018 CLINICAL DATA:  Patient began feeling ill 5 days ago with nausea, vomiting and fatigue. Shortness of breath today. Masses noted on the current chest radiograph. EXAM: CT ANGIOGRAPHY CHEST WITH CONTRAST TECHNIQUE: Multidetector CT imaging of the chest was performed using the standard protocol during bolus administration of intravenous contrast. Multiplanar CT image reconstructions and MIPs were obtained to evaluate the vascular anatomy. CONTRAST:  186mL ISOVUE-370 IOPAMIDOL (ISOVUE-370) INJECTION 76% COMPARISON:  Current chest radiograph. FINDINGS: Cardiovascular: There is satisfactory opacification of the pulmonary arteries to the segmental level. There is no evidence of a pulmonary embolism. Large right upper lobe and confluent mediastinal and hilar mass occludes the right upper lobe pulmonary artery. It narrows the right lower lobe pulmonary  artery. The mass also invades the superior vena cava. It abuts and slightly deforms the innominate artery. Mass partly insert goals the ascending aorta and proximal aortic arch. Heart is normal in size and configuration. Trace amount pericardial fluid. Mild three-vessel coronary artery calcifications. Thoracic aorta is normal in caliber. No dissection. Mild thoracic aortic atherosclerosis. Mediastinum/Nodes: Large right upper lobe mass is contiguous with a large mediastinal mass extends from the right superior mediastinum, follow-up in the anterior, middle and posterior mediastinum, extending inferiorly to just above the caval atrial junction. Mass narrows and invades the superior vena cava although the vessel is not excluded. Mass partly encases the innominate artery and right subclavian artery and abuts the right common carotid artery. Mass partly encases the ascending thoracic aorta proximal arch. Mass also abuts the trachea displacing it to the left. Trachea is narrowed, greatest narrowing in the anterior to posterior dimension. It measures 1.5 x 0.5 mm in transverse dimensions at the level of the top of the aortic arch. There are shotty prominent mildly enlarged mediastinal lymph nodes the prevascular space. Extends to surround the right hilar structures as described under the cardiovascular section. Mass is contiguous with an enlarged subcarinal lymph node measures 3.2 cm in short axis. There are prominent neck base lymph nodes. Largest lies at the right neck base measuring 2.9 cm short axis. Lungs/Pleura: Trace bilateral pleural effusions. Large medial right upper lobe mass contiguous with the mediastinal mass and right hilar mass. The overall size of this mass in mediastinal and right hilar components measures approximately 14 x 10 x 13 cm. There are lung nodules.  Largest lies in the left upper lobe centered on image 77, series 9, measuring 15 x 13 mm. Nodule in the left lower lobe, image 109, measuring 6  mm. Nodule in the right upper lobe, image 100, measuring 6 mm. Elevation the right hemidiaphragm. Mild atelectasis lies in the right lower lobe above the elevated hemidiaphragm. There is mild atelectasis adjacent to the large right upper lobe mass. Mild centrilobular emphysema. No convincing pneumonia. No pulmonary edema. No pneumothorax. Upper Abdomen: No acute findings. No liver or adrenal masses noted on the included field of view. Musculoskeletal: No fracture or acute finding. No osteoblastic or osteolytic lesions. Review of the MIP images confirms the above findings. IMPRESSION: 1. No evidence of a pulmonary embolism. 2. Large mass consistent with malignancy is centered on the right upper mediastinum and medial right upper lobe. It extends to surround the right hilar structures. The mass invades and narrows the superior vena cava. It occludes the right upper lobe pulmonary artery and narrows right lower lobe pulmonary artery. It abuts the aorta and innominate, right subclavian and right common carotid arteries. It also abuts and narrows esophagus. 3. Or is additional metastatic disease with metastatic adenopathy at the right neck base, small metastatic lung nodules and additional shotty mediastinal nodes that are presumed to be metastatic disease. 4. Trace pleural effusions. No convincing pulmonary edema. No pneumonia. Mild right lower lobe atelectasis. Aortic Atherosclerosis (ICD10-I70.0) and Emphysema (ICD10-J43.9). Electronically Signed   By: Lajean Manes M.D.   On: 08/22/2018 14:32    (Echo, Carotid, EGD, Colonoscopy, ERCP)    Subjective:   Discharge Exam: Vitals:   09/04/18 2114 09/05/18 0439  BP:  114/89  Pulse:  (!) 108  Resp:  18  Temp:  97.8 F (36.6 C)  SpO2: 97% 99%   Vitals:   09/04/18 1234 09/04/18 2108 09/04/18 2114 09/05/18 0439  BP: 140/79 (!) 141/85  114/89  Pulse: (!) 120 (!) 111  (!) 108  Resp: 16 18  18   Temp: 98.1 F (36.7 C) 98.4 F (36.9 C)  97.8 F (36.6 C)   TempSrc: Oral Oral  Oral  SpO2: 95% 97% 97% 99%  Weight:      Height:        General: Pt is alert, awake, not in acute distress Cardiovascular: RRR, S1/S2 +, no rubs, no gallops Respiratory: CTA bilaterally, no wheezing, no rhonchi Abdominal: Soft, NT, ND, bowel sounds + Extremities: no edema, no cyanosis    The results of significant diagnostics from this hospitalization (including imaging, microbiology, ancillary and laboratory) are listed below for reference.     Microbiology: Recent Results (from the past 240 hour(s))  MRSA PCR Screening     Status: None   Collection Time: 08/30/18 11:56 PM  Result Value Ref Range Status   MRSA by PCR NEGATIVE NEGATIVE Final    Comment:        The GeneXpert MRSA Assay (FDA approved for NASAL specimens only), is one component of a comprehensive MRSA colonization surveillance program. It is not intended to diagnose MRSA infection nor to guide or monitor treatment for MRSA infections. Performed at [facility], [place] [address] [place], [place] [address]      Labs: BNP (last 3 results) Recent Labs    08/22/18 1221 08/30/18 1721  BNP 47.0 16.9   Basic Metabolic Panel: Recent Labs  Lab 08/30/18 1721 09/03/18 0427  NA 132* 132*  K 3.9 5.0  CL 92* 91*  CO2 27 28  GLUCOSE  102* 122*  BUN 13 18  CREATININE 0.56* 0.58*  CALCIUM 8.6* 9.0   Liver Function Tests: Recent Labs  Lab 08/30/18 1721  AST 21  ALT 17  ALKPHOS 64  BILITOT 0.6  PROT 6.6  ALBUMIN 3.1*   No results for input(s): LIPASE, AMYLASE in the last 168 hours. No results for input(s): AMMONIA in the last 168 hours. CBC: Recent Labs  Lab 08/30/18 1721 09/03/18 0427  WBC 10.7* 9.8  NEUTROABS 9.5*  --   HGB 9.1* 9.1*  HCT 30.5* 31.5*  MCV 68.8* 70.3*  PLT 435* 309   Cardiac Enzymes: Recent Labs  Lab 08/30/18 1721  TROPONINI <0.03   BNP: Invalid input(s): POCBNP CBG: Recent Labs  Lab 08/30/18 1501  GLUCAP 121*    D-Dimer No results for input(s): DDIMER in the last 72 hours. Hgb A1c No results for input(s): HGBA1C in the last 72 hours. Lipid Profile No results for input(s): CHOL, HDL, LDLCALC, TRIG, CHOLHDL, LDLDIRECT in the last 72 hours. Thyroid function studies No results for input(s): TSH, T4TOTAL, T3FREE, THYROIDAB in the last 72 hours.  Invalid input(s): FREET3 Anemia work up No results for input(s): VITAMINB12, FOLATE, FERRITIN, TIBC, IRON, RETICCTPCT in the last 72 hours. Urinalysis    Component Value Date/Time   COLORURINE AMBER (A) 04/19/2013 1739   APPEARANCEUR HAZY (A) 04/19/2013 1739   LABSPEC 1.022 04/19/2013 1739   PHURINE 6.0 04/19/2013 1739   GLUCOSEU NEGATIVE 04/19/2013 1739   HGBUR NEGATIVE 04/19/2013 1739   BILIRUBINUR SMALL (A) 04/19/2013 1739   KETONESUR 15 (A) 04/19/2013 1739   PROTEINUR 30 (A) 04/19/2013 1739   UROBILINOGEN 1.0 04/19/2013 1739   NITRITE NEGATIVE 04/19/2013 1739   LEUKOCYTESUR NEGATIVE 04/19/2013 1739   Sepsis Labs Invalid input(s): PROCALCITONIN,  WBC,  LACTICIDVEN Microbiology Recent Results (from the past 240 hour(s))  MRSA PCR Screening     Status: None   Collection Time: 08/30/18 11:56 PM  Result Value Ref Range Status   MRSA by PCR NEGATIVE NEGATIVE Final    Comment:        The GeneXpert MRSA Assay (FDA approved for NASAL specimens only), is one component of a comprehensive MRSA colonization surveillance program. It is not intended to diagnose MRSA infection nor to guide or monitor treatment for MRSA infections. Performed at [facility], [place] [address] [place],  [address]      Time coordinating discharge: 34 minutes  SIGNED:   Georgette Shell, MD  [facility] 09/05/2018, 7:33 AM Pager   If 7PM-7AM, please contact night-coverage [email or web address] Password [email or web address]

## 2018-09-05 NOTE — Discharge Instructions (Signed)
Patient to follow-up with radiation oncologist tomorrow Monday, 09/06/2018 patient is aware of the appointment

## 2018-09-05 NOTE — Care Management Note (Signed)
Case Management Note  Patient Details  Name: EASON HOUSMAN MRN: [id number] Date of Birth: 10/17/1956  Subjective/Objective:   Dyspnea, lung mass, SVC syndrome                 Action/Plan: NCM spoke to pt and offered choice for [facility]. Pt had [place] list. Pt requested [facility].  Contacted [facility] with new referral.   Expected Discharge Date:  09/05/18               Expected Discharge Plan:  [place]  In-House Referral:  NA  Discharge planning Services  CM Consult  Post Acute Care Choice:  Home Health Choice offered to:  Patient  DME Arranged:  N/A DME Agency:  NA  HH Arranged:  PT [place] Agency:  [place]  Status of Service:  Completed, signed off  If discussed at [place] of Stay Meetings, dates discussed:    Additional Comments:  Erenest Rasher, RN 09/05/2018, 8:46 AM

## 2018-09-06 ENCOUNTER — Ambulatory Visit
Admission: RE | Admit: 2018-09-06 | Discharge: 2018-09-06 | Disposition: A | Discharge status: Home / Self Care | Payer: Medicare Other | Source: Ambulatory Visit | Attending: Radiation Oncology | Admitting: Radiation Oncology

## 2018-09-06 DIAGNOSIS — I871 Compression of vein: Secondary | ICD-10-CM | POA: Diagnosis not present

## 2018-09-06 DIAGNOSIS — Z510 Encounter for antineoplastic radiation therapy: Secondary | ICD-10-CM | POA: Diagnosis not present

## 2018-09-07 ENCOUNTER — Ambulatory Visit: Payer: Medicare Other

## 2018-09-08 ENCOUNTER — Encounter: Payer: Self-pay | Admitting: Radiation Oncology

## 2018-09-08 ENCOUNTER — Ambulatory Visit (HOSPITAL_COMMUNITY): Payer: Medicare Other

## 2018-09-08 ENCOUNTER — Ambulatory Visit: Admission: RE | Admit: 2018-09-08 | Payer: Medicare Other | Source: Ambulatory Visit

## 2018-09-08 NOTE — Progress Notes (Signed)
  Radiation Oncology         [phone] ________________________________  Name: Gerald Adkins MRN: [id number]  Date: 09/08/2018  DOB: 28-Nov-1956  End of Treatment Note  Diagnosis:  Metastatic melanoma    Indication for treatment:  Palliative, SVC syndrome       Radiation treatment dates:   08/24/2018-09/06/2018  Site/dose:   1. Chest, 3 Gy in 3 fractions for a total dose of 9 Gy           2. Chest 1, 3 Gy in 7 fractions for a total dose of 21 Gy  Beams/energy:   1. Photon, 15X         2. Photon, 15X  Narrative: The patient tolerated radiation treatment relatively well. At the beginning of his treatment, he reported associated intermittent cough x clear sputum. He denies pain or difficulty swallowing. Towards the end of his treatment, he reported mild fatigue, DOE, constiaption, and productive cough x gray sputum.  He denies swallowing issues or lack of appetite.   Plan: The patient has completed radiation treatment. The patient will return to radiation oncology clinic for routine followup in one month. I advised them to call or return sooner if they have any questions or concerns related to their recovery or treatment.  -----------------------------------  Blair Promise, PhD, MD  This document serves as a record of services personally performed by Gery Pray, MD. It was created on his behalf by [facility], a trained medical scribe. The creation of this record is based on the scribe's personal observations and the provider's statements to them. This document has been checked and approved by the attending provider.

## 2018-09-09 ENCOUNTER — Ambulatory Visit (HOSPITAL_COMMUNITY): Admit: 2018-09-09 | Payer: Medicare Other

## 2018-09-09 ENCOUNTER — Ambulatory Visit: Payer: Medicare Other

## 2018-09-10 ENCOUNTER — Telehealth: Payer: Self-pay | Admitting: Internal Medicine

## 2018-09-10 ENCOUNTER — Telehealth: Payer: Self-pay | Admitting: Medical Oncology

## 2018-09-10 ENCOUNTER — Inpatient Hospital Stay: Payer: Medicare Other | Admitting: Internal Medicine

## 2018-09-10 ENCOUNTER — Encounter: Payer: Self-pay | Admitting: Internal Medicine

## 2018-09-10 NOTE — Telephone Encounter (Signed)
Cannot come to appts before 12 noon ." I have told y'all that"! Schedule request sent for r/s after 1200 noon

## 2018-09-10 NOTE — Telephone Encounter (Signed)
Scheduled appt per 10/11 sch message - pt is aware of appt date and time   

## 2018-09-16 ENCOUNTER — Telehealth: Payer: Self-pay | Admitting: Medical Oncology

## 2018-09-16 NOTE — Telephone Encounter (Signed)
PT orders- Requests orders for treatment for Lower extremity strengthening , gait and balance training, fall prevention a 1x week for 4 weeks.  I told Merry Proud that Gerald Adkins will evaluate pt on 10/23 and someone will call him back.

## 2018-09-22 ENCOUNTER — Inpatient Hospital Stay: Payer: Medicare Other

## 2018-09-22 ENCOUNTER — Telehealth: Payer: Self-pay | Admitting: Internal Medicine

## 2018-09-22 ENCOUNTER — Encounter: Payer: Self-pay | Admitting: Internal Medicine

## 2018-09-22 ENCOUNTER — Inpatient Hospital Stay: Payer: Medicare Other | Attending: Internal Medicine | Admitting: Internal Medicine

## 2018-09-22 VITALS — BP 121/78 | HR 119 | Temp 97.9°F | Resp 18 | Ht 73.0 in | Wt 175.9 lb

## 2018-09-22 DIAGNOSIS — Z79899 Other long term (current) drug therapy: Secondary | ICD-10-CM | POA: Diagnosis not present

## 2018-09-22 DIAGNOSIS — Z8673 Personal history of transient ischemic attack (TIA), and cerebral infarction without residual deficits: Secondary | ICD-10-CM | POA: Diagnosis not present

## 2018-09-22 DIAGNOSIS — Z923 Personal history of irradiation: Secondary | ICD-10-CM | POA: Diagnosis not present

## 2018-09-22 DIAGNOSIS — R590 Localized enlarged lymph nodes: Secondary | ICD-10-CM

## 2018-09-22 DIAGNOSIS — E785 Hyperlipidemia, unspecified: Secondary | ICD-10-CM | POA: Insufficient documentation

## 2018-09-22 DIAGNOSIS — C439 Malignant melanoma of skin, unspecified: Secondary | ICD-10-CM | POA: Diagnosis present

## 2018-09-22 DIAGNOSIS — I10 Essential (primary) hypertension: Secondary | ICD-10-CM | POA: Diagnosis not present

## 2018-09-22 DIAGNOSIS — Z7189 Other specified counseling: Secondary | ICD-10-CM

## 2018-09-22 DIAGNOSIS — R5382 Chronic fatigue, unspecified: Secondary | ICD-10-CM

## 2018-09-22 DIAGNOSIS — I871 Compression of vein: Secondary | ICD-10-CM | POA: Diagnosis not present

## 2018-09-22 DIAGNOSIS — J439 Emphysema, unspecified: Secondary | ICD-10-CM | POA: Diagnosis not present

## 2018-09-22 DIAGNOSIS — Z5112 Encounter for antineoplastic immunotherapy: Secondary | ICD-10-CM

## 2018-09-22 DIAGNOSIS — J398 Other specified diseases of upper respiratory tract: Secondary | ICD-10-CM | POA: Diagnosis not present

## 2018-09-22 NOTE — Telephone Encounter (Signed)
Appts scheduled avs/calendar to be printed from [place] per 10/23 los

## 2018-09-22 NOTE — Progress Notes (Signed)
START ON PATHWAY REGIMEN - Melanoma     A cycle is 21 days:     Pembrolizumab   **Always confirm dose/schedule in your pharmacy ordering system**  Patient Characteristics: Stage IV, Unresectable, Symptomatic, First Line, BRAF V600 Wild Type / BRAF V600 Results Pending or Unknown, Candidate for Immunotherapy Disease Subtype: Cutaneous Current Disease Status: Distant Metastases AJCC 8 Stage Grouping: IV AJCC T Category: cT0 AJCC N Category: N0 AJCC M Category: M1b(1) BRAF V600 Mutation Status: BRAF V600 Wild Type (No Mutation) Metastatic Disease Type: Symptomatic Line of Therapy: First Line Immunotherapy Candidate Status: Candidate for Immunotherapy Intent of Therapy: Non-Curative / Palliative Intent, Discussed with Patient

## 2018-09-22 NOTE — Progress Notes (Signed)
[facility] [phone]   [phone]  OFFICE PROGRESS NOTE  [name], MD [place] [address]  DIAGNOSIS: Metastatic malignant melanoma (T0, N0, M1b) was negative.  Mutation presented with large mass centered on the right upper mediastinum and medial right upper lobe with invasion and narrowing of the superior vena cava as well as occlusion of the right upper lobe pulmonary artery and narrowing of the right lower lobe pulmonary artery diagnosed in September 2019 and clinical presentation of SVC syndrome.  PRIOR THERAPY: Palliative radiotherapy to the large right upper lobe lung mass and mediastinum under the care of Dr. Sondra Come.  CURRENT THERAPY: Treatment with immunotherapy with Keytruda 200 mg IV every 3 weeks.  First dose September 29, 2018.  INTERVAL HISTORY: Gerald Adkins 62 y.o. male returns to the clinic today for follow-up visit.  The patient was seen for initial consultation during his hospitalization when he presented with SVC syndrome and the final pathology was consistent with metastatic melanoma.  The patient underwent a course of palliative radiotherapy to the large right upper lobe lung mass with mediastinal invasion under the care of Dr. Sondra Come.  He had significant improvement in his SVC syndrome.  He was supposed to have a PET scan as well as MRI of the brain before this visit but unfortunately the patient did not show up for his scans because of transportation issues.  He is feeling much better today.  He denied having any chest pain but continues to have shortness of breath with exertion with mild cough and no hemoptysis.  He denied having any fever or chills.  He lost few pounds since his last visit.  He denied having any nausea, vomiting, diarrhea or constipation.  He denied having any headache or visual changes.  The patient is here today for evaluation and discussion of his treatment options.  MEDICAL  HISTORY: Past Medical History:  Diagnosis Date  . Chronic back pain 1989   broke back  . Hyperlipidemia   . Hypertension   . 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 Stroke [facility])     ALLERGIES:  is allergic to butterscotch flavor.  MEDICATIONS:  Current Outpatient Medications  Medication Sig Dispense Refill  . amLODipine (NORVASC) 5 MG tablet Take 1 tablet (5 mg total) by mouth daily. 30 tablet 0  . benzonatate (TESSALON) 100 MG capsule Take 1 capsule (100 mg total) by mouth 3 (three) times daily as needed for cough. 20 capsule 0  . chlorpheniramine-HYDROcodone (TUSSIONEX) 10-8 MG/5ML SUER Take 5 mLs by mouth every 12 (twelve) hours. 140 mL 0  . ferrous sulfate 325 (65 FE) MG tablet Take 1 tablet (325 mg total) by mouth 2 (two) times daily with a meal.  3  . guaiFENesin-dextromethorphan (ROBITUSSIN DM) 100-10 MG/5ML syrup Take 10 mLs by mouth every 4 (four) hours. 118 mL 0  . HYDROcodone-acetaminophen (NORCO/VICODIN) 5-325 MG tablet Take 1-2 tablets by mouth every 6 (six) hours as needed for moderate pain or severe pain. 20 tablet 0  . Menthol 1.7 MG LOZG Use as directed 1 lozenge in the mouth or throat daily as needed (cough).    . ondansetron (ZOFRAN) 4 MG tablet Take 1 tablet (4 mg total) by mouth every 6 (six) hours as needed for nausea. 20 tablet 0  . polyethylene glycol (MIRALAX / GLYCOLAX) packet Take 17 g by mouth daily as needed for mild constipation. 14 each 0  . predniSONE (DELTASONE)  10 MG tablet Take 3 tablets daily for the first 4 days then 2 tablets daily for the next 4 days and then 1 tablet daily till done 30 tablet 0  . senna (SENOKOT) 8.6 MG TABS tablet Take 1 tablet (8.6 mg total) by mouth at bedtime. 120 each 0   No current facility-administered medications for this visit.     SURGICAL HISTORY:  Past Surgical History:  Procedure Laterality Date  . APPENDECTOMY    . BACK SURGERY     lumbar spine, cut sciatic nerve  . CHOLECYSTECTOMY    . SCALENE NODE BIOPSY Right  08/23/2018   Procedure: BIOPSY OF RIGHT SCALENE LYMPH NODE;  Surgeon: Grace Isaac, MD;  Location: [facility];  Service: Thoracic;  Laterality: Right;    REVIEW OF SYSTEMS:  Constitutional: positive for fatigue Eyes: negative Ears, nose, mouth, throat, and face: negative Respiratory: positive for cough and dyspnea on exertion Cardiovascular: negative Gastrointestinal: negative Genitourinary:negative Integument/breast: negative Hematologic/lymphatic: negative Musculoskeletal:negative Neurological: negative Behavioral/Psych: negative Endocrine: negative Allergic/Immunologic: negative   PHYSICAL EXAMINATION: General appearance: alert, cooperative, fatigued and no distress Head: Normocephalic, without obvious abnormality, atraumatic Neck: no adenopathy, no JVD, supple, symmetrical, trachea midline and thyroid not enlarged, symmetric, no tenderness/mass/nodules Lymph nodes: Cervical, supraclavicular, and axillary nodes normal. Resp: diminished breath sounds RUL and dullness to percussion RUL Back: symmetric, no curvature. ROM normal. No CVA tenderness. Cardio: regular rate and rhythm, S1, S2 normal, no murmur, click, rub or gallop GI: soft, non-tender; bowel sounds normal; no masses,  no organomegaly Extremities: extremities normal, atraumatic, no cyanosis or edema Neurologic: Alert and oriented X 3, normal strength and tone. Normal symmetric reflexes. Normal coordination and gait  ECOG PERFORMANCE STATUS: 1 - Symptomatic but completely ambulatory  Blood pressure 121/78, pulse (!) 119, temperature 97.9 F (36.6 C), temperature source Oral, resp. rate 18, height '6\' 1"'$  (1.854 m), weight 175 lb 14.4 oz (79.8 kg), SpO2 100 %.  LABORATORY DATA: Lab Results  Component Value Date   WBC 9.8 09/03/2018   HGB 9.1 (L) 09/03/2018   HCT 31.5 (L) 09/03/2018   MCV 70.3 (L) 09/03/2018   PLT 309 09/03/2018      Chemistry      Component Value Date/Time   NA 132 (L) 09/03/2018 0427   K 5.0  09/03/2018 0427   CL 91 (L) 09/03/2018 0427   CO2 28 09/03/2018 0427   BUN 18 09/03/2018 0427   CREATININE 0.58 (L) 09/03/2018 0427      Component Value Date/Time   CALCIUM 9.0 09/03/2018 0427   ALKPHOS 64 08/30/2018 1721   AST 21 08/30/2018 1721   ALT 17 08/30/2018 1721   BILITOT 0.6 08/30/2018 1721       RADIOGRAPHIC STUDIES: Dg Chest 2 View  Result Date: 08/30/2018 CLINICAL DATA:  Patient has been diagnosed recently with melanoma, and has been receiving radiation treatments. Patient was supposed to get radiation today, but patient could not lay flat due to SOB. Patient tachycardic and tachypnic in triage with tripoding EXAM: CHEST - 2 VIEW COMPARISON:  Chest CT and chest radiographs, 08/22/2018. FINDINGS: Large right mediastinal at contiguous right upper lobe mass, contiguous right hilar mass and smaller nodule above the left hilar structures in the left upper lobe are stable from the prior exams. There is opacity at the right lung base that is new from the prior chest radiograph and chest CT, likely atelectasis. Pneumonia is possible. Remainder of the lungs is clear. Cardiac silhouette is normal in size. No pleural  effusion or pneumothorax. Skeletal structures are intact. IMPRESSION: 1. New right lung base opacity, most likely atelectasis. Consider pneumonia if there are consistent clinical findings. 2. No other change from the prior chest CT and chest radiograph. Large right mediastinal and contiguous right hilar and upper lobe mass consistent with carcinoma. Presumed metastatic lung nodule in the central left upper lobe. Electronically Signed   By: Lajean Manes M.D.   On: 08/30/2018 16:48   Ct Angio Chest Pe W Or Wo Contrast  Result Date: 08/30/2018 CLINICAL DATA:  History of melanoma and lung mass EXAM: CT ANGIOGRAPHY CHEST WITH CONTRAST TECHNIQUE: Multidetector CT imaging of the chest was performed using the standard protocol during bolus administration of intravenous contrast.  Multiplanar CT image reconstructions and MIPs were obtained to evaluate the vascular anatomy. CONTRAST:  173m ISOVUE-370 IOPAMIDOL (ISOVUE-370) INJECTION 76% COMPARISON:  Chest x-ray 08/30/2018, CT 08/22/2018 FINDINGS: Cardiovascular: Satisfactory opacification of the pulmonary arteries to the segmental level. There is abrupt occlusion of the right upper lobe pulmonary artery by large mass lesion. The visible portions of enhancing right lower lobe pulmonary artery demonstrate no filling defects. No filling defect within the left-sided vasculature. There is significant narrowing of the right lower lobe pulmonary artery by mediastinal mass lesion and hilar mass. Coronary artery calcification. Normal heart size. Small pericardial effusion. Non enhancement of right brachiocephalic vein which is presumably occluded by tumor. There is narrowing of the left brachiocephalic vessel and subsequent occlusion of the left brachiocephalic vessel proximal to the confluence. There is narrowing of the superior vena cava with occluded appearance of the upper aspect of the vena cava. Interim finding of extensive collateral vessels within the left chest wall. Best seen on coronal views are filling defects within the left subclavian, distal jugular, and brachiocephalic vessels, concerning for thrombus. Aortic atherosclerosis. No aneurysm. Tumor within the mediastinum partially surrounds the ascending aorta and proximal arch. Mediastinum/Nodes: Large mediastinal mass measuring 13.1 x 10.5 by 10.7 cm, grossly unchanged in size as compared with recent CT. Multiple enlarged AP window lymph nodes. Bulky subcarinal lymph node measuring 28 mm. Mediastinal mass is contiguous with bulky right hilar mass measuring 5.3 cm. Esophagus within normal limits. No thyroid mass. Tracheal deviation to the left by mass. Narrowed appearance of the tracheal bronchial bifurcation and right bronchus by tumor. Right supraclavicular node up to 2.7 cm. Hazy  infiltration of the right axilla with multiple small nodes. Lungs/Pleura: Mild emphysematous disease. Multiple pulmonary nodules concerning for metastatic disease. Dominant nodule in the left upper lobe measuring 15 x 14 mm, not significantly changed. Increased small loculated right pleural effusion. Upper Abdomen: Abnormal enhancement of the central liver and caudate lobe, likely due to altered perfusion dynamics. Musculoskeletal: No acute or suspicious osseous abnormality. Review of the MIP images confirms the above findings. IMPRESSION: 1. Occlusion of the right upper pulmonary artery and branch vessels due to the presence of a large mediastinal mass. Significant narrowing of right lower lobe pulmonary artery as well. These findings are similar as compared with 08/22/2018. No acute embolus visualized within the enhancing portions of the right middle and lower lobe pulmonary vessels nor the left-sided vasculature. 2. Large mediastinal mass with bulky mediastinal and hilar adenopathy. Interval finding of non enhancement of right brachiocephalic vein with tumor occlusion of the upper aspect of the SVC. Interim development of extensive left chest wall and paraspinal collateral vessels. This is suspected to be secondary to thrombus/DVT within the left subclavian, distal jugular, and brachiocephalic veins. 3. Marked mass-effect on  the lower trachea by mediastinal mass lesion with AP diameter of the distal trachea above the bifurcation measuring 7 mm. There is narrowing of the right greater than left bronchi as well. 4. Multiple bilateral pulmonary nodules concerning for metastatic disease, grossly unchanged. Large right supraclavicular lymph node concerning for metastatic disease. 5. Increased small slightly loculated right posterior pleural effusion. 6. Abnormal enhancement pattern of the liver, presumably due to altered perfusion dynamics from extensive collateral vasculature. Aortic Atherosclerosis (ICD10-I70.0) and  Emphysema (ICD10-J43.9). Electronically Signed   By: Donavan Foil M.D.   On: 08/30/2018 21:17    ASSESSMENT AND PLAN: This is a very pleasant 62 years old white male with metastatic malignant melanoma with negative BRAF mutation presented with large right upper lobe as well as 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 The patient underwent a palliative course of radiotherapy to the large right upper lobe and mediastinal mass under the care of Dr. Sondra Come with improvement of the SVC symptoms and he is feeling a little bit better. I had a lengthy discussion with the patient today about his current condition and treatment options. I recommended for the patient to complete the staging work-up by ordering a PET scan as well as MRI of the brain to rule out brain metastasis. I discussed with the patient his treatment options including palliative care and hospice referral versus consideration of palliative treatment with immunotherapy with Keytruda 200 mg IV every 3 weeks. The patient is interested in treatment with immunotherapy.  I discussed with him the adverse effect of this treatment including but not limited to immunotherapy mediated skin rash, diarrhea, inflammation of the lung, kidney, liver, thyroid or other endocrine dysfunction. I will arrange for the patient to have a chemotherapy education class before the first dose of his treatment. The patient is expected to start the first cycle of this treatment next week. I will arrange for the patient to come back for follow-up visit in 4 weeks for evaluation before starting cycle #2. The patient was advised to call immediately if he has any concerning symptoms in the interval. The patient voices understanding of current disease status and treatment options and is in agreement with the current care plan. All questions were answered. The patient knows to call the clinic with any  problems, questions or concerns. We can certainly see the patient much sooner if necessary.  I spent 15 minutes counseling the patient face to face. The total time spent in the appointment was 25 minutes.  Disclaimer: This note was dictated with voice recognition software. Similar sounding words can inadvertently be transcribed and may not be corrected upon review.

## 2018-09-29 ENCOUNTER — Inpatient Hospital Stay: Payer: Medicare Other

## 2018-09-29 VITALS — BP 123/76 | HR 108 | Temp 97.7°F | Resp 18

## 2018-09-29 DIAGNOSIS — C439 Malignant melanoma of skin, unspecified: Secondary | ICD-10-CM | POA: Diagnosis not present

## 2018-09-29 DIAGNOSIS — R5382 Chronic fatigue, unspecified: Secondary | ICD-10-CM

## 2018-09-29 LAB — CMP (CANCER CENTER ONLY)
ALK PHOS: 77 U/L (ref 38–126)
ALT: 9 U/L (ref 0–44)
ANION GAP: 9 (ref 5–15)
AST: 11 U/L — ABNORMAL LOW (ref 15–41)
Albumin: 2.7 g/dL — ABNORMAL LOW (ref 3.5–5.0)
BILIRUBIN TOTAL: 0.2 mg/dL — AB (ref 0.3–1.2)
BUN: 9 mg/dL (ref 6–20)
CALCIUM: 8.9 mg/dL (ref 8.9–10.3)
CO2: 27 mmol/L (ref 22–32)
Chloride: 102 mmol/L (ref 98–111)
Creatinine: 0.59 mg/dL — ABNORMAL LOW (ref 0.61–1.24)
GFR, Estimated: >60 mL/min (ref >60)
GLUCOSE: 84 mg/dL (ref 70–99)
Potassium: 4.3 mmol/L (ref 3.5–5.1)
Sodium: 138 mmol/L (ref 135–145)
TOTAL PROTEIN: 6.4 g/dL — AB (ref 6.5–8.1)

## 2018-09-29 LAB — CBC WITH DIFFERENTIAL (CANCER CENTER ONLY)
Abs Immature Granulocytes: 0.08 10*3/uL — ABNORMAL HIGH (ref 0.00–0.07)
BASOS ABS: 0.1 10*3/uL (ref 0.0–0.1)
Basophils Relative: 1 %
EOS PCT: 3 %
Eosinophils Absolute: 0.2 10*3/uL (ref 0.0–0.5)
HCT: 33.7 % — ABNORMAL LOW (ref 39.0–52.0)
HEMOGLOBIN: 9.5 g/dL — AB (ref 13.0–17.0)
Immature Granulocytes: 1 %
LYMPHS PCT: 13 %
Lymphs Abs: 0.9 10*3/uL (ref 0.7–4.0)
MCH: 21.3 pg — ABNORMAL LOW (ref 26.0–34.0)
MCHC: 28.2 g/dL — AB (ref 30.0–36.0)
MCV: 75.6 fL — ABNORMAL LOW (ref 80.0–100.0)
Monocytes Absolute: 0.8 10*3/uL (ref 0.1–1.0)
Monocytes Relative: 11 %
NEUTROS PCT: 71 %
NRBC: 0 % (ref 0.0–0.2)
Neutro Abs: 4.9 10*3/uL (ref 1.7–7.7)
Platelet Count: 502 10*3/uL — ABNORMAL HIGH (ref 150–400)
RBC: 4.46 MIL/uL (ref 4.22–5.81)
RDW: 23.5 % — AB (ref 11.5–15.5)
WBC Count: 6.9 10*3/uL (ref 4.0–10.5)

## 2018-09-29 LAB — TSH: TSH: 0.501 u[IU]/mL (ref 0.320–4.118)

## 2018-09-29 MED ORDER — SODIUM CHLORIDE 0.9 % IV SOLN
200.0000 mg | Freq: Once | INTRAVENOUS | Status: AC
  Administered 2018-09-29: 200 mg via INTRAVENOUS
  Filled 2018-09-29: qty 8

## 2018-09-29 MED ORDER — SODIUM CHLORIDE 0.9 % IV SOLN
Freq: Once | INTRAVENOUS | Status: AC
  Administered 2018-09-29: 16:00:00 via INTRAVENOUS
  Filled 2018-09-29: qty 250

## 2018-09-29 NOTE — Progress Notes (Signed)
Ok to treat with HR per Abelina Bachelor, RN per MD [facility]

## 2018-09-29 NOTE — Patient Instructions (Signed)
[facility] Discharge Instructions for Patients Receiving Chemotherapy  Today you received the following chemotherapy agents Keytruda  To help prevent nausea and vomiting after your treatment, we encourage you to take your nausea medication as directed.    If you develop nausea and vomiting that is not controlled by your nausea medication, call the clinic.   BELOW ARE SYMPTOMS THAT SHOULD BE REPORTED IMMEDIATELY:  *FEVER GREATER THAN 100.5 F  *CHILLS WITH OR WITHOUT FEVER  NAUSEA AND VOMITING THAT IS NOT CONTROLLED WITH YOUR NAUSEA MEDICATION  *UNUSUAL SHORTNESS OF BREATH  *UNUSUAL BRUISING OR BLEEDING  TENDERNESS IN MOUTH AND THROAT WITH OR WITHOUT PRESENCE OF ULCERS  *URINARY PROBLEMS  *BOWEL PROBLEMS  UNUSUAL RASH Items with * indicate a potential emergency and should be followed up as soon as possible.  Feel free to call the clinic should you have any questions or concerns. The clinic phone number is [phone].  Please show the [place] at check-in to the Emergency Department and triage nurse.   Pembrolizumab injection What is this medicine? PEMBROLIZUMAB (pem broe liz ue mab) is a monoclonal antibody. It is used to treat melanoma, head and neck cancer, Hodgkin lymphoma, non-small cell lung cancer, urothelial cancer, stomach cancer, and cancers that have a certain genetic condition. This medicine may be used for other purposes; ask your health care provider or pharmacist if you have questions. COMMON BRAND NAME(S): Keytruda What should I tell my health care provider before I take this medicine? They need to know if you have any of these conditions: -diabetes -immune system problems -inflammatory bowel disease -liver disease -lung or breathing disease -lupus -organ transplant -an unusual or allergic reaction to pembrolizumab, other medicines, foods, dyes, or preservatives -pregnant or trying to get pregnant -breast-feeding How should I  use this medicine? This medicine is for infusion into a vein. It is given by a health care professional in a hospital or clinic setting. A special MedGuide will be given to you before each treatment. Be sure to read this information carefully each time. Talk to your pediatrician regarding the use of this medicine in children. While this drug may be prescribed for selected conditions, precautions do apply. Overdosage: If you think you have taken too much of this medicine contact a poison control center or emergency room at once. NOTE: This medicine is only for you. Do not share this medicine with others. What if I miss a dose? It is important not to miss your dose. Call your doctor or health care professional if you are unable to keep an appointment. What may interact with this medicine? Interactions have not been studied. Give your health care provider a list of all the medicines, herbs, non-prescription drugs, or dietary supplements you use. Also tell them if you smoke, drink alcohol, or use illegal drugs. Some items may interact with your medicine. This list may not describe all possible interactions. Give your health care provider a list of all the medicines, herbs, non-prescription drugs, or dietary supplements you use. Also tell them if you smoke, drink alcohol, or use illegal drugs. Some items may interact with your medicine. What should I watch for while using this medicine? Your condition will be monitored carefully while you are receiving this medicine. You may need blood work done while you are taking this medicine. Do not become pregnant while taking this medicine or for 4 months after stopping it. Women should inform their doctor if they wish to become pregnant or think  they might be pregnant. There is a potential for serious side effects to an unborn child. Talk to your health care professional or pharmacist for more information. Do not breast-feed an infant while taking this medicine or  for 4 months after the last dose. What side effects may I notice from receiving this medicine? Side effects that you should report to your doctor or health care professional as soon as possible: -allergic reactions like skin rash, itching or hives, swelling of the face, lips, or tongue -bloody or black, tarry -breathing problems -changes in vision -chest pain -chills -constipation -cough -dizziness or feeling faint or lightheaded -fast or irregular heartbeat -fever -flushing -hair loss -low blood counts - this medicine may decrease the number of white blood cells, red blood cells and platelets. You may be at increased risk for infections and bleeding. -muscle pain -muscle weakness -persistent headache -signs and symptoms of high blood sugar such as dizziness; dry mouth; dry skin; fruity breath; nausea; stomach pain; increased hunger or thirst; increased urination -signs and symptoms of kidney injury like trouble passing urine or change in the amount of urine -signs and symptoms of liver injury like dark urine, light-colored stools, loss of appetite, nausea, right upper belly pain, yellowing of the eyes or skin -stomach pain -sweating -weight loss Side effects that usually do not require medical attention (report to your doctor or health care professional if they continue or are bothersome): -decreased appetite -diarrhea -tiredness This list may not describe all possible side effects. Call your doctor for medical advice about side effects. You may report side effects to FDA at 1-800-FDA-1088. Where should I keep my medicine? This drug is given in a hospital or clinic and will not be stored at home. NOTE: This sheet is a summary. It may not cover all possible information. If you have questions about this medicine, talk to your doctor, pharmacist, or health care provider.  2018 Elsevier/Gold Standard (2016-08-26 12:29:36)

## 2018-09-30 ENCOUNTER — Telehealth: Payer: Self-pay | Admitting: Medical Oncology

## 2018-09-30 NOTE — Telephone Encounter (Signed)
Unable to reach pt on phone.

## 2018-10-01 ENCOUNTER — Ambulatory Visit (HOSPITAL_COMMUNITY): Admission: RE | Admit: 2018-10-01 | Payer: Medicare Other | Source: Ambulatory Visit

## 2018-10-01 ENCOUNTER — Encounter (HOSPITAL_COMMUNITY): Admission: RE | Admit: 2018-10-01 | Payer: Medicare Other | Source: Ambulatory Visit

## 2018-10-11 ENCOUNTER — Other Ambulatory Visit: Payer: Self-pay

## 2018-10-11 ENCOUNTER — Encounter: Payer: Self-pay | Admitting: Radiation Oncology

## 2018-10-11 ENCOUNTER — Ambulatory Visit
Admission: RE | Admit: 2018-10-11 | Discharge: 2018-10-11 | Disposition: A | Discharge status: Home / Self Care | Payer: Medicare Other | Source: Ambulatory Visit | Attending: Radiation Oncology | Admitting: Radiation Oncology

## 2018-10-11 VITALS — BP 115/76 | HR 119 | Temp 98.6°F | Resp 17 | Ht 73.0 in | Wt 169.5 lb

## 2018-10-11 DIAGNOSIS — Z79899 Other long term (current) drug therapy: Secondary | ICD-10-CM | POA: Insufficient documentation

## 2018-10-11 DIAGNOSIS — I871 Compression of vein: Secondary | ICD-10-CM | POA: Diagnosis not present

## 2018-10-11 DIAGNOSIS — C439 Malignant melanoma of skin, unspecified: Secondary | ICD-10-CM

## 2018-10-11 NOTE — Progress Notes (Signed)
Gerald Adkins Is here for a follow-up  appointment today.Patient denies any pain or fatigue today.Patient denies any shortness of breath or difficulty with swallowing. Patient states that he is coughing up gray and yellow phlegm. Patient states that his appeitie varies. Patient denies any issues with his skin. Patient states that he is suppose to start chemotherapy again next week ,but he states that he is not going to restart  Chemotherapy. Vitals:   10/11/18 1449  BP: 115/76  Pulse: (!) 119  Resp: 17  Temp: 98.6 F (37 C)  TempSrc: Oral  SpO2: 100%  Weight: 169 lb 8 oz (76.9 kg)  Height: 6\' 1"  (1.854 m)    Wt Readings from Last 3 Encounters:  10/11/18 169 lb 8 oz (76.9 kg)  09/22/18 175 lb 14.4 oz (79.8 kg)  08/30/18 185 lb 3 oz (84 kg)

## 2018-10-11 NOTE — Progress Notes (Signed)
Radiation Oncology         [phone] ________________________________  Name: Gerald Adkins MRN: [id number]  Date: 10/11/2018  DOB: August 10, 1956  Follow-Up Visit Note  CC: [name], MD  [name], MD    ICD-10-CM   1. 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 Malignant melanoma, unspecified site [facility]) C43.9     Diagnosis:   SVC syndrome,Metastatic melanoma   Interval Since Last Radiation:  1 months, 4 days  08/24/2018-09/06/2018  1. Chest, 3 Gy in 3 fractions for a total dose of 9 Gy 2. Chest 1, 3 Gy of 7 fractions for a total dose of 21 Gy (30 Gy)  Narrative:  The patient returns today for routine 1 month follow-up.    On review of systems, he reports breathing slightly better, severe nausea and vomiting as well as fatigue after immunotherapy administration. He reports productive cough with phlegm. He is smoking marijuana to alleviate his nausea, but reports it creates a lot of phlegm. He reports being unable to get out of bed two weeks following his chemotherapy treatments, but is becoming stronger. Marland Kitchen He reported radiation related skin changes on his chest which felt like a severe sunburn. He is using a cane to walk.  he denies chest pain and any other symptoms.   He has an appointment to see Dr. Worthy Flank NP on November 20th. He wants to discontinue immunotherapy and potentially undergo homeopathic treatments.                  ALLERGIES:  is allergic to butterscotch flavor.  Meds: Current Outpatient Medications  Medication Sig Dispense Refill  . amLODipine (NORVASC) 5 MG tablet Take 1 tablet (5 mg total) by mouth daily. (Patient not taking: Reported on 09/22/2018) 30 tablet 0  . benzonatate (TESSALON) 100 MG capsule Take 1 capsule (100 mg total) by mouth 3 (three) times daily as needed for cough. (Patient not taking: Reported on 09/22/2018) 20 capsule 0  . guaiFENesin-dextromethorphan (ROBITUSSIN DM) 100-10 MG/5ML syrup Take 10 mLs by mouth every 4 (four) hours. (Patient not  taking: Reported on 10/11/2018) 118 mL 0  . Menthol 1.7 MG LOZG Use as directed 1 lozenge in the mouth or throat daily as needed (cough).     No current facility-administered medications for this encounter.     Physical Findings: The patient is in no acute distress. Patient is alert and oriented.  height is 6\' 1"  (1.854 m) and weight is 169 lb 8 oz (76.9 kg). His oral temperature is 98.6 F (37 C). His blood pressure is 115/76 and his pulse is 119 (abnormal). His respiration is 17 and oxygen saturation is 100%. .  No significant changes. Lungs are clear to auscultation bilaterally. Heart has regular rate and rhythm. No palpable cervical, supraclavicular, or axillary adenopathy. Abdomen soft, non-tender, normal bowel sounds. Overall, he has much less swelling in his upper chest and neck area. Skin well-healed and treatment area.  Lab Findings: Lab Results  Component Value Date   WBC 6.9 09/29/2018   HGB 9.5 (L) 09/29/2018   HCT 33.7 (L) 09/29/2018   MCV 75.6 (L) 09/29/2018   PLT 502 (H) 09/29/2018    Radiographic Findings: No results found.  Impression:    Metastatic melanoma. Patient is clinically better after his palliative radiation therapy with his breathing being better as well as improved neck and chest swelling.   Plan:  PRN follow-up in radiation oncology. He has started on immunotherapy with Keytruda. Patient reports this made him  very sick and he may stop this therapy. He will meet with medical oncology later this month for further evaluation concerning this issue. I encouraged him to complete his brain MRI and PET scan.  ____________________________________   Blair Promise, PhD, MD    This document serves as a record of services personally performed by Gery Pray, MD. It was created on his behalf by Mary-Margaret Loma Messing, a trained medical scribe. The creation of this record is based on the scribe's personal observations and the provider's statements to them. This  document has been checked and approved by the attending provider.

## 2018-10-20 ENCOUNTER — Inpatient Hospital Stay: Payer: Medicare Other

## 2018-10-20 ENCOUNTER — Telehealth: Payer: Self-pay

## 2018-10-20 ENCOUNTER — Inpatient Hospital Stay: Payer: Medicare Other | Admitting: Oncology

## 2018-10-20 NOTE — Telephone Encounter (Signed)
Contacted patient to follow up on missed appointment. Patient stated that he feels too bad and is tired of the weakness, vomiting and nausea and does not want treatment today or see the MD. He did not want to reschedule today's appointment at this time. Patient had no other questions or concerns.

## 2018-11-10 ENCOUNTER — Inpatient Hospital Stay: Payer: Medicare Other | Attending: Internal Medicine

## 2018-11-10 ENCOUNTER — Inpatient Hospital Stay: Payer: Medicare Other | Admitting: Oncology

## 2018-11-10 ENCOUNTER — Inpatient Hospital Stay: Payer: Medicare Other

## 2019-03-16 ENCOUNTER — Encounter (HOSPITAL_COMMUNITY): Payer: Self-pay | Admitting: Emergency Medicine

## 2019-03-16 ENCOUNTER — Inpatient Hospital Stay (HOSPITAL_COMMUNITY)
Admission: EM | Admit: 2019-03-16 | Discharge: 2019-03-26 | DRG: 330 | Disposition: A | Discharge status: Home Health | Payer: Medicare Other | Attending: Surgery | Admitting: Surgery

## 2019-03-16 ENCOUNTER — Emergency Department (HOSPITAL_COMMUNITY): Payer: Medicare Other

## 2019-03-16 ENCOUNTER — Other Ambulatory Visit: Payer: Self-pay

## 2019-03-16 DIAGNOSIS — Z9049 Acquired absence of other specified parts of digestive tract: Secondary | ICD-10-CM

## 2019-03-16 DIAGNOSIS — Z91018 Allergy to other foods: Secondary | ICD-10-CM | POA: Diagnosis not present

## 2019-03-16 DIAGNOSIS — E785 Hyperlipidemia, unspecified: Secondary | ICD-10-CM | POA: Diagnosis present

## 2019-03-16 DIAGNOSIS — C799 Secondary malignant neoplasm of unspecified site: Secondary | ICD-10-CM

## 2019-03-16 DIAGNOSIS — G8929 Other chronic pain: Secondary | ICD-10-CM | POA: Diagnosis present

## 2019-03-16 DIAGNOSIS — Z8673 Personal history of transient ischemic attack (TIA), and cerebral infarction without residual deficits: Secondary | ICD-10-CM

## 2019-03-16 DIAGNOSIS — Z800 Family history of malignant neoplasm of digestive organs: Secondary | ICD-10-CM | POA: Diagnosis not present

## 2019-03-16 DIAGNOSIS — Z87891 Personal history of nicotine dependence: Secondary | ICD-10-CM

## 2019-03-16 DIAGNOSIS — D62 Acute posthemorrhagic anemia: Secondary | ICD-10-CM | POA: Diagnosis not present

## 2019-03-16 DIAGNOSIS — K921 Melena: Secondary | ICD-10-CM | POA: Diagnosis present

## 2019-03-16 DIAGNOSIS — E876 Hypokalemia: Secondary | ICD-10-CM | POA: Diagnosis not present

## 2019-03-16 DIAGNOSIS — I871 Compression of vein: Secondary | ICD-10-CM | POA: Diagnosis present

## 2019-03-16 DIAGNOSIS — K561 Intussusception: Secondary | ICD-10-CM

## 2019-03-16 DIAGNOSIS — R109 Unspecified abdominal pain: Secondary | ICD-10-CM | POA: Diagnosis present

## 2019-03-16 DIAGNOSIS — I10 Essential (primary) hypertension: Secondary | ICD-10-CM | POA: Diagnosis present

## 2019-03-16 DIAGNOSIS — Z803 Family history of malignant neoplasm of breast: Secondary | ICD-10-CM

## 2019-03-16 DIAGNOSIS — Z0189 Encounter for other specified special examinations: Secondary | ICD-10-CM

## 2019-03-16 DIAGNOSIS — K56609 Unspecified intestinal obstruction, unspecified as to partial versus complete obstruction: Secondary | ICD-10-CM | POA: Diagnosis present

## 2019-03-16 DIAGNOSIS — C439 Malignant melanoma of skin, unspecified: Secondary | ICD-10-CM | POA: Diagnosis present

## 2019-03-16 DIAGNOSIS — Z801 Family history of malignant neoplasm of trachea, bronchus and lung: Secondary | ICD-10-CM

## 2019-03-16 DIAGNOSIS — Z4659 Encounter for fitting and adjustment of other gastrointestinal appliance and device: Secondary | ICD-10-CM

## 2019-03-16 DIAGNOSIS — R112 Nausea with vomiting, unspecified: Secondary | ICD-10-CM

## 2019-03-16 LAB — URINALYSIS, ROUTINE W REFLEX MICROSCOPIC
Bilirubin Urine: NEGATIVE
Glucose, UA: NEGATIVE mg/dL
Hgb urine dipstick: NEGATIVE
Ketones, ur: NEGATIVE mg/dL
Leukocytes,Ua: NEGATIVE
Nitrite: NEGATIVE
Protein, ur: NEGATIVE mg/dL
Specific Gravity, Urine: >1.046 — ABNORMAL HIGH (ref 1.005–1.030)
pH: 5 (ref 5.0–8.0)

## 2019-03-16 LAB — COMPREHENSIVE METABOLIC PANEL
ALT: 8 U/L (ref 0–44)
AST: 14 U/L — ABNORMAL LOW (ref 15–41)
Albumin: 3 g/dL — ABNORMAL LOW (ref 3.5–5.0)
Alkaline Phosphatase: 80 U/L (ref 38–126)
Anion gap: 15 (ref 5–15)
BUN: 24 mg/dL — ABNORMAL HIGH (ref 6–20)
CO2: 25 mmol/L (ref 22–32)
Calcium: 8.9 mg/dL (ref 8.9–10.3)
Chloride: 94 mmol/L — ABNORMAL LOW (ref 98–111)
Creatinine, Ser: 0.77 mg/dL (ref 0.61–1.24)
GFR calc Af Amer: >60 mL/min (ref >60)
GFR calc non Af Amer: >60 mL/min (ref >60)
Glucose, Bld: 151 mg/dL — ABNORMAL HIGH (ref 70–99)
Potassium: 4.3 mmol/L (ref 3.5–5.1)
Sodium: 134 mmol/L — ABNORMAL LOW (ref 135–145)
Total Bilirubin: 0.4 mg/dL (ref 0.3–1.2)
Total Protein: 7 g/dL (ref 6.5–8.1)

## 2019-03-16 LAB — CBC WITH DIFFERENTIAL/PLATELET
Abs Immature Granulocytes: 0.14 10*3/uL — ABNORMAL HIGH (ref 0.00–0.07)
Basophils Absolute: 0 10*3/uL (ref 0.0–0.1)
Basophils Relative: 0 %
Eosinophils Absolute: 0 10*3/uL (ref 0.0–0.5)
Eosinophils Relative: 0 %
HCT: 31.3 % — ABNORMAL LOW (ref 39.0–52.0)
Hemoglobin: 8.1 g/dL — ABNORMAL LOW (ref 13.0–17.0)
Immature Granulocytes: 1 %
Lymphocytes Relative: 3 %
Lymphs Abs: 0.4 10*3/uL — ABNORMAL LOW (ref 0.7–4.0)
MCH: 16.7 pg — ABNORMAL LOW (ref 26.0–34.0)
MCHC: 25.9 g/dL — ABNORMAL LOW (ref 30.0–36.0)
MCV: 64.5 fL — ABNORMAL LOW (ref 80.0–100.0)
Monocytes Absolute: 0.8 10*3/uL (ref 0.1–1.0)
Monocytes Relative: 6 %
Neutro Abs: 11.6 10*3/uL — ABNORMAL HIGH (ref 1.7–7.7)
Neutrophils Relative %: 90 %
Platelets: 679 10*3/uL — ABNORMAL HIGH (ref 150–400)
RBC: 4.85 MIL/uL (ref 4.22–5.81)
RDW: 19.3 % — ABNORMAL HIGH (ref 11.5–15.5)
WBC: 13 10*3/uL — ABNORMAL HIGH (ref 4.0–10.5)
nRBC: 0 % (ref 0.0–0.2)

## 2019-03-16 LAB — ABO/RH: ABO/RH(D): O POS

## 2019-03-16 LAB — PROTIME-INR
INR: 1 (ref 0.8–1.2)
Prothrombin Time: 13.2 seconds (ref 11.4–15.2)

## 2019-03-16 LAB — LACTIC ACID, PLASMA
Lactic Acid, Venous: 4.5 mmol/L (ref 0.5–1.9)
Lactic Acid, Venous: 1.1 mmol/L (ref 0.5–1.9)

## 2019-03-16 LAB — POC OCCULT BLOOD, ED: Fecal Occult Bld: POSITIVE — AB

## 2019-03-16 LAB — APTT: aPTT: 37 seconds — ABNORMAL HIGH (ref 24–36)

## 2019-03-16 LAB — LIPASE, BLOOD: Lipase: 22 U/L (ref 11–51)

## 2019-03-16 MED ORDER — SODIUM CHLORIDE 0.9 % IV SOLN
80.0000 mg | Freq: Once | INTRAVENOUS | Status: DC
  Filled 2019-03-16: qty 80

## 2019-03-16 MED ORDER — SODIUM CHLORIDE 0.9 % IV SOLN: 8.0000 mg/h | INTRAVENOUS | Status: DC

## 2019-03-16 MED ORDER — PIPERACILLIN-TAZOBACTAM 3.375 G IVPB 30 MIN
3.3750 g | Freq: Once | INTRAVENOUS | Status: AC
  Administered 2019-03-16: 17:00:00 3.375 g via INTRAVENOUS
  Filled 2019-03-16: qty 50

## 2019-03-16 MED ORDER — SODIUM CHLORIDE (PF) 0.9 % IJ SOLN
INTRAMUSCULAR | Status: AC
  Filled 2019-03-16: qty 50

## 2019-03-16 MED ORDER — MORPHINE SULFATE (PF) 4 MG/ML IV SOLN
4.0000 mg | Freq: Once | INTRAVENOUS | Status: AC
  Administered 2019-03-16: 4 mg via INTRAVENOUS
  Filled 2019-03-16: qty 1

## 2019-03-16 MED ORDER — MUPIROCIN 2 % EX OINT: 1.0000 "application " | TOPICAL_OINTMENT | Freq: Two times a day (BID) | CUTANEOUS | Status: DC

## 2019-03-16 MED ORDER — SODIUM CHLORIDE 0.9 % IV BOLUS
1000.0000 mL | Freq: Once | INTRAVENOUS | Status: AC
  Administered 2019-03-16: 1000 mL via INTRAVENOUS

## 2019-03-16 MED ORDER — PIPERACILLIN-TAZOBACTAM 3.375 G IVPB
3.3750 g | Freq: Three times a day (TID) | INTRAVENOUS | Status: DC
  Administered 2019-03-16 – 2019-03-18 (×5): 3.375 g via INTRAVENOUS
  Filled 2019-03-16 (×5): qty 50

## 2019-03-16 MED ORDER — POTASSIUM CHLORIDE IN NACL 20-0.9 MEQ/L-% IV SOLN
INTRAVENOUS | Status: DC
  Administered 2019-03-16 – 2019-03-17 (×3): via INTRAVENOUS
  Filled 2019-03-16 (×3): qty 1000

## 2019-03-16 MED ORDER — SODIUM CHLORIDE 0.9 % IV SOLN
INTRAVENOUS | Status: AC | PRN
  Administered 2019-03-16: 23:00:00 via INTRAVENOUS

## 2019-03-16 MED ORDER — HYDROMORPHONE HCL 1 MG/ML IJ SOLN
1.0000 mg | INTRAMUSCULAR | Status: DC | PRN
  Administered 2019-03-16 – 2019-03-17 (×7): 1 mg via INTRAVENOUS
  Filled 2019-03-16 (×7): qty 1

## 2019-03-16 MED ORDER — IOHEXOL 350 MG/ML SOLN
100.0000 mL | Freq: Once | INTRAVENOUS | Status: AC | PRN
  Administered 2019-03-16: 16:00:00 100 mL via INTRAVENOUS

## 2019-03-16 MED ORDER — ONDANSETRON HCL 4 MG/2ML IJ SOLN
4.0000 mg | Freq: Once | INTRAMUSCULAR | Status: AC
  Administered 2019-03-16: 15:00:00 4 mg via INTRAVENOUS
  Filled 2019-03-16: qty 2

## 2019-03-16 NOTE — H&P (Addendum)
Gerald Adkins is an 63 y.o. male.   Chief Complaint: weekness HPI: This is a 63 year old gentleman with known metastatic melanoma who presented today with weakness.  Reports that he has had intermittent sharp abdominal pain and some bloody diarrhea as well as nausea and vomiting for 2 weeks.  He finally presented to the emergency department.  CT scan of the chest, abdomen, and pelvis.  This showed an area in small bowel as well as in the distal ileum and descending colon there was intussuscepted.  He was found to have a slightly elevated white blood count as well as elevated lactic acid level.  He reports that his pain has been intermittent and not really changed in the last 2 weeks and his primary reason for coming to the hospital with his weakness.  Previously, he received radiation to metastatic disease in his mediastinum and did receive 1 round of chemotherapy toward the end of last year which he did not tolerate.  He denies fever, cough, chest pain, or shortness of breath.  Past Medical History:  Diagnosis Date  . Chronic back pain 1989   broke back  . Hyperlipidemia   . Hypertension   . 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 Stroke [facility])     Past Surgical History:  Procedure Laterality Date  . APPENDECTOMY    . BACK SURGERY     lumbar spine, cut sciatic nerve  . CHOLECYSTECTOMY    . SCALENE NODE BIOPSY Right 08/23/2018   Procedure: BIOPSY OF RIGHT SCALENE LYMPH NODE;  Surgeon: Grace Isaac, MD;  Location: [facility] OR;  Service: Thoracic;  Laterality: Right;    Family History  Problem Relation Age of Onset  . Breast cancer Mother        multiple types  . Lung cancer Maternal Aunt        nonsmoking  . Pancreatic cancer Maternal Aunt    Social History:  reports that he quit smoking about 21 years ago. He has a 49.50 pack-year smoking history. He has never used smokeless tobacco. He reports current alcohol use. He reports current drug use. Frequency: 3.00 times per week. Drug:  Marijuana.  Allergies:  Allergies  Allergen Reactions  . Butterscotch Flavor Anaphylaxis and Shortness Of Breath    (Not in a hospital admission)   Results for orders placed or performed during the hospital encounter of 03/16/19 (from the past 48 hour(s))  CBC with Differential     Status: Abnormal   Collection Time: 03/16/19  3:12 PM  Result Value Ref Range   WBC 13.0 (H) 4.0 - 10.5 K/uL   RBC 4.85 4.22 - 5.81 MIL/uL   Hemoglobin 8.1 (L) 13.0 - 17.0 g/dL    Comment: Reticulocyte Hemoglobin testing may be clinically indicated, consider ordering this additional test [id number]    HCT 31.3 (L) 39.0 - 52.0 %   MCV 64.5 (L) 80.0 - 100.0 fL   MCH 16.7 (L) 26.0 - 34.0 pg   MCHC 25.9 (L) 30.0 - 36.0 g/dL   RDW 19.3 (H) 11.5 - 15.5 %   Platelets 679 (H) 150 - 400 K/uL   nRBC 0.0 0.0 - 0.2 %   Neutrophils Relative % 90 %   Neutro Abs 11.6 (H) 1.7 - 7.7 K/uL   Lymphocytes Relative 3 %   Lymphs Abs 0.4 (L) 0.7 - 4.0 K/uL   Monocytes Relative 6 %   Monocytes Absolute 0.8 0.1 - 1.0 K/uL   Eosinophils Relative 0 %  Eosinophils Absolute 0.0 0.0 - 0.5 K/uL   Basophils Relative 0 %   Basophils Absolute 0.0 0.0 - 0.1 K/uL   Immature Granulocytes 1 %   Abs Immature Granulocytes 0.14 (H) 0.00 - 0.07 K/uL   Polychromasia PRESENT    Ovalocytes PRESENT     Comment: Performed at [facility], [place] [address] [place], [place] [phone]  Lipase, blood     Status: None   Collection Time: 03/16/19  3:12 PM  Result Value Ref Range   Lipase 22 11 - 51 U/L    Comment: Performed at [facility], [place] [address] [place], [place] [address]  Comprehensive metabolic panel     Status: Abnormal   Collection Time: 03/16/19  3:12 PM  Result Value Ref Range   Sodium 134 (L) 135 - 145 mmol/L   Potassium 4.3 3.5 - 5.1 mmol/L   Chloride 94 (L) 98 - 111 mmol/L   CO2 25 22 - 32 mmol/L   Glucose, Bld 151 (H) 70 - 99 mg/dL   BUN 24 (H) 6 - 20 mg/dL   Creatinine, Ser  0.77 0.61 - 1.24 mg/dL   Calcium 8.9 8.9 - 10.3 mg/dL   Total Protein 7.0 6.5 - 8.1 g/dL   Albumin 3.0 (L) 3.5 - 5.0 g/dL   AST 14 (L) 15 - 41 U/L   ALT 8 0 - 44 U/L   Alkaline Phosphatase 80 38 - 126 U/L   Total Bilirubin 0.4 0.3 - 1.2 mg/dL   GFR calc non Af Amer >60 >60 mL/min   GFR calc Af Amer >60 >60 mL/min   Anion gap 15 5 - 15    Comment: Performed at [facility], [place] [address], [place], [place] [address]  POC occult blood, ED     Status: Abnormal   Collection Time: 03/16/19  3:23 PM  Result Value Ref Range   Fecal Occult Bld POSITIVE (A) NEGATIVE  Type and screen     Status: None   Collection Time: 03/16/19  3:29 PM  Result Value Ref Range   ABO/RH(D) O POS    Antibody Screen NEG    Sample Expiration      03/19/2019 Performed at [facility], [place] [address] [place], [place] [address]   ABO/Rh     Status: None (Preliminary result)   Collection Time: 03/16/19  3:29 PM  Result Value Ref Range   ABO/RH(D)      O POS Performed at [facility], [place] [address] [place], [place] [address]   Lactic acid, plasma     Status: Abnormal   Collection Time: 03/16/19  3:46 PM  Result Value Ref Range   Lactic Acid, Venous 4.5 (HH) 0.5 - 1.9 mmol/L    Comment: CRITICAL RESULT CALLED TO, READ BACK BY AND VERIFIED WITH: Ouida Sills [id number] @ [place] Performed at [place] [address] [place], [place] [address]   Urinalysis, Routine w reflex microscopic     Status: Abnormal   Collection Time: 03/16/19  5:05 PM  Result Value Ref Range   Color, Urine YELLOW YELLOW   APPearance CLEAR CLEAR   Specific Gravity, Urine >1.046 (H) 1.005 - 1.030   pH 5.0 5.0 - 8.0   Glucose, UA NEGATIVE NEGATIVE mg/dL   Hgb urine dipstick NEGATIVE NEGATIVE   Bilirubin Urine NEGATIVE NEGATIVE   Ketones, ur NEGATIVE NEGATIVE mg/dL   Protein, ur NEGATIVE NEGATIVE mg/dL  Nitrite NEGATIVE NEGATIVE    Leukocytes,Ua NEGATIVE NEGATIVE    Comment: Performed at [place] [address], [place], [place] [address]   Ct Angio Chest Pe W And/or Wo Contrast  Result Date: 03/16/2019 CLINICAL DATA:  Weakness with lower abdominal pain 1 day. Diagnosed with lung cancer last year. EXAM: CT ANGIOGRAPHY CHEST CT ABDOMEN AND PELVIS WITH CONTRAST TECHNIQUE: Multidetector CT imaging of the chest was performed using the standard protocol during bolus administration of intravenous contrast. Multiplanar CT image reconstructions and MIPs were obtained to evaluate the vascular anatomy. Multidetector CT imaging of the abdomen and pelvis was performed using the standard protocol during bolus administration of intravenous contrast. CONTRAST:  182mL OMNIPAQUE IOHEXOL 350 MG/ML SOLN COMPARISON:  Chest CT 08/30/2018 and abdominal CT 06/30/2016 FINDINGS: CTA CHEST FINDINGS Cardiovascular: Heart is normal size. Thoracic aorta is normal in caliber with minimal calcified plaque present. Pulmonary arterial system is within normal. Persistent obstruction of the right upper lobar arteries. Remaining vascular structures are unremarkable. Mediastinum/Nodes: Significant decrease in size in patient's previously seen anterior mediastinal mass over the right side of the anterior mediastinum with mild residual density in this region extending down to the right hilum. This residual mass measures approximately 2.9 x 3.9 cm in transverse in AP dimension (previously 13.1 x 10.5 cm). The right hilar component of this mass measures 2.2 cm in AP dimension (previously 5.7 cm). 1.4 cm left infrahilar node. New 2.4 cm soft tissue nodule abutting the right heart border and new 2 cm soft tissue nodule abutting the left anterolateral heart border just above the level of the ventricular septum. Lungs/Pleura: Lungs are adequately inflated and demonstrate resolution of several small right-sided pulmonary nodules. There is new opacification  with bronchiectatic change over the medial right lung with areas of nodularity likely post radiation change. Stable nodule over the medial left upper lobe measuring 12 x 15 mm. Increase in size of a nodule over the posterior left lower lobe measuring 13 mm. Minimal emphysematous disease. No evidence of pleural effusion. Musculoskeletal: Mild degenerative change of the spine. Review of the MIP images confirms the above findings. CT ABDOMEN and PELVIS FINDINGS Hepatobiliary: 2.3 cm hypodense mass over the posterior segment right lobe of the liver as well as 1.3 cm hypodense mass over the anterior right lobe of the liver likely representing metastatic disease. Subcentimeter hypodensity over the right lobe which may represent a small cyst versus metastatic disease. Previous cholecystectomy. Biliary tree is unremarkable. Pancreas: Normal Spleen: Normal. Adrenals/Urinary Tract: New 1.8 cm left adrenal nodule likely metastatic focus. Right adrenal gland is normal. Kidneys are normal in size without hydronephrosis or nephrolithiasis. There are a couple subcentimeter right renal cortical hypodensities too small to characterize but likely cysts. Ureters and bladder are normal. Stomach/Bowel: Stomach is within normal. There are multiple fluid and air-filled dilated small bowel loops measuring up to 5.4 cm in diameter. There is an abrupt transition 0.8 in the midline abdomen at the level of the umbilicus with there is an ileo ileal intussusception possibly due to a small lead point mass. There is a second ileocolic intussusception in the right lower quadrant likely due to additional bowel masses. This ileocolic intussusception extends to the hepatic flexure. The colon is decompressed distal to the hepatic flexure. Vascular/Lymphatic: Moderate calcified plaque over the abdominal aorta. Portacaval lymph node measuring 2.2 cm in diameter with adjacent celiac axis node measuring 1.8 cm in diameter. Reproductive: Normal. Other: No  significant free fluid or free peritoneal air.  Musculoskeletal: Degenerative change of the spine and hips. Review of the MIP images confirms the above findings. IMPRESSION: 1. Small bowel obstruction with abrupt transition point in the midline abdomen at the level of the umbilicus due to ileo ileal intussusception likely secondary to small bowel mass which is likely metastatic disease. There is a second intussusception in the right lower quadrant as there is an ileocolic intussusception extending to the hepatic flexure likely due to multiple bowel masses. 2. Significant decrease in size of patient's anterior mediastinal mass as the residual mass measures approximately 2.9 x 3.9 cm (previously 13.1 x 10.5 cm). Decreased size of right hilar component of this mass. 1.4 cm low left infrahilar lymph node. New metastatic foci abutting the right and left heart border as described. Resolution of several small right pulmonary nodules with stable and increased size of left pulmonary nodules as described. 3. 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 4. Two new hypodense liver masses with the larger measuring 2.3 cm over the right lobe likely metastatic disease. New 1.8 cm left adrenal nodule likely metastatic disease. Worsening adenopathy in the region of the celiac axis and portacaval region likely metastatic disease. 5. A few subcentimeter right renal cortical hypodensities too small to characterize but likely cysts. 6. Aortic Atherosclerosis (ICD10-I70.0) and Emphysema (ICD10-J43.9). These results were called by telephone at the time of interpretation on 03/16/2019 at 5:15 pm to PA, [facility] , who verbally acknowledged these results. Electronically Signed   By: Marin Olp M.D.   On: 03/16/2019 17:16   Ct Abdomen Pelvis W Contrast  Result Date: 03/16/2019 CLINICAL DATA:  Weakness with lower abdominal pain 1 day. Diagnosed with lung cancer last  year. EXAM: CT ANGIOGRAPHY CHEST CT ABDOMEN AND PELVIS WITH CONTRAST TECHNIQUE: Multidetector CT imaging of the chest was performed using the standard protocol during bolus administration of intravenous contrast. Multiplanar CT image reconstructions and MIPs were obtained to evaluate the vascular anatomy. Multidetector CT imaging of the abdomen and pelvis was performed using the standard protocol during bolus administration of intravenous contrast. CONTRAST:  12mL OMNIPAQUE IOHEXOL 350 MG/ML SOLN COMPARISON:  Chest CT 08/30/2018 and abdominal CT 06/30/2016 FINDINGS: CTA CHEST FINDINGS Cardiovascular: Heart is normal size. Thoracic aorta is normal in caliber with minimal calcified plaque present. Pulmonary arterial system is within normal. Persistent obstruction of the right upper lobar arteries. Remaining vascular structures are unremarkable. Mediastinum/Nodes: Significant decrease in size in patient's previously seen anterior mediastinal mass over the right side of the anterior mediastinum with mild residual density in this region extending down to the right hilum. This residual mass measures approximately 2.9 x 3.9 cm in transverse in AP dimension (previously 13.1 x 10.5 cm). The right hilar component of this mass measures 2.2 cm in AP dimension (previously 5.7 cm). 1.4 cm left infrahilar node. New 2.4 cm soft tissue nodule abutting the right heart border and new 2 cm soft tissue nodule abutting the left anterolateral heart border just above the level of the ventricular septum. Lungs/Pleura: Lungs are adequately inflated and demonstrate resolution of several small right-sided pulmonary nodules. There is new opacification with bronchiectatic change over the medial right lung with areas of nodularity likely post radiation change. Stable nodule over the medial left upper lobe measuring 12 x 15 mm. Increase in size of a nodule over the posterior left lower lobe measuring 13 mm. Minimal emphysematous disease. No  evidence of pleural effusion. Musculoskeletal: Mild degenerative change of the spine. Review  of the MIP images confirms the above findings. CT ABDOMEN and PELVIS FINDINGS Hepatobiliary: 2.3 cm hypodense mass over the posterior segment right lobe of the liver as well as 1.3 cm hypodense mass over the anterior right lobe of the liver likely representing metastatic disease. Subcentimeter hypodensity over the right lobe which may represent a small cyst versus metastatic disease. Previous cholecystectomy. Biliary tree is unremarkable. Pancreas: Normal Spleen: Normal. Adrenals/Urinary Tract: New 1.8 cm left adrenal nodule likely metastatic focus. Right adrenal gland is normal. Kidneys are normal in size without hydronephrosis or nephrolithiasis. There are a couple subcentimeter right renal cortical hypodensities too small to characterize but likely cysts. Ureters and bladder are normal. Stomach/Bowel: Stomach is within normal. There are multiple fluid and air-filled dilated small bowel loops measuring up to 5.4 cm in diameter. There is an abrupt transition 0.8 in the midline abdomen at the level of the umbilicus with there is an ileo ileal intussusception possibly due to a small lead point mass. There is a second ileocolic intussusception in the right lower quadrant likely due to additional bowel masses. This ileocolic intussusception extends to the hepatic flexure. The colon is decompressed distal to the hepatic flexure. Vascular/Lymphatic: Moderate calcified plaque over the abdominal aorta. Portacaval lymph node measuring 2.2 cm in diameter with adjacent celiac axis node measuring 1.8 cm in diameter. Reproductive: Normal. Other: No significant free fluid or free peritoneal air. Musculoskeletal: Degenerative change of the spine and hips. Review of the MIP images confirms the above findings. IMPRESSION: 1. Small bowel obstruction with abrupt transition point in the midline abdomen at the level of the umbilicus due to ileo  ileal intussusception likely secondary to small bowel mass which is likely metastatic disease. There is a second intussusception in the right lower quadrant as there is an ileocolic intussusception extending to the hepatic flexure likely due to multiple bowel masses. 2. Significant decrease in size of patient's anterior mediastinal mass as the residual mass measures approximately 2.9 x 3.9 cm (previously 13.1 x 10.5 cm). Decreased size of right hilar component of this mass. 1.4 cm low left infrahilar lymph node. New metastatic foci abutting the right and left heart border as described. Resolution of several small right pulmonary nodules with stable and increased size of left pulmonary nodules as described. 3. 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 4. Two new hypodense liver masses with the larger measuring 2.3 cm over the right lobe likely metastatic disease. New 1.8 cm left adrenal nodule likely metastatic disease. Worsening adenopathy in the region of the celiac axis and portacaval region likely metastatic disease. 5. A few subcentimeter right renal cortical hypodensities too small to characterize but likely cysts. 6. Aortic Atherosclerosis (ICD10-I70.0) and Emphysema (ICD10-J43.9). These results were called by telephone at the time of interpretation on 03/16/2019 at 5:15 pm to PA, [facility] , who verbally acknowledged these results. Electronically Signed   By: Marin Olp M.D.   On: 03/16/2019 17:16    Review of Systems  Constitutional: Positive for malaise/fatigue and weight loss. Negative for chills, diaphoresis and fever.  Respiratory: Negative for cough and sputum production.   Cardiovascular: Negative for chest pain.  Gastrointestinal: Positive for abdominal pain, blood in stool, diarrhea, nausea and vomiting.  Genitourinary: Negative for dysuria.  All other systems reviewed and are negative.   Blood pressure 133/77, pulse  94, temperature 98.1 F (36.7 C), temperature source Rectal, resp. rate 15, height 6\' 1"  (1.854 m), weight 81.6 kg,  SpO2 100 %. Physical Exam  Constitutional: He is oriented to person, place, and time. He appears well-developed and well-nourished. No distress.  HENT:  Head: Normocephalic and atraumatic.  Right Ear: External ear normal.  Left Ear: External ear normal.  Nose: Nose normal.  Mouth/Throat: Oropharynx is clear and moist. No oropharyngeal exudate.  Eyes: Pupils are equal, round, and reactive to light. Right eye exhibits no discharge. Left eye exhibits no discharge. No scleral icterus.  Neck: Normal range of motion. No tracheal deviation present. No thyromegaly present.  Cardiovascular: Regular rhythm, normal heart sounds and intact distal pulses.  Slightly tachycardic  Respiratory: Effort normal and breath sounds normal. No respiratory distress. He has no wheezes. He has no rales.  GI: Soft. He exhibits no distension. There is abdominal tenderness.  His abdominal exam is surprisingly nondistended with only mild tenderness and guarding  Musculoskeletal: Normal range of motion.        General: Deformity present. No tenderness or edema.     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  Skin: Skin is warm and dry. He is not diaphoretic. No erythema.  Psychiatric: His behavior is normal. Judgment normal.     Assessment/Plan Metastatic melanoma with small and large bowel intussusception and obstruction   I have reviewed the CT scan and his laboratory data.  Currently, he has not had frank peritonitis.  I suspect he has been intussuscepted for up to 2 weeks.  I have reviewed the case with my partner Dr. Barry Dienes.  I decided to hold on surgery tonight.  I suspect he is profoundly dehydrated and may need more severely anemic.  We will check his PT and INR.  He will be admitted to the floor and continue with  plans for decompression.  IV antibiotics have been started.  He will be aggressively rehydrated.  Plan will be to proceed with an exploratory laparotomy tomorrow depending on his wishes.  I explained to him that he may have extensive metastatic disease in his abdomen and there is a potential that nothing can be done to remedy the situation.  There is a strong possibility that he will remain intubated postoperatively as well.  Palliative care will need to become involved as well.  He agrees with the plan  Coralie Keens, MD 03/16/2019, 8:42 PM

## 2019-03-16 NOTE — ED Notes (Signed)
ED TO INPATIENT HANDOFF REPORT  Name/Age/Gender Gerald Adkins 63 y.o. male  Code Status Code Status History    Date Active Date Inactive Code Status Order ID Comments User Context   08/30/2018 2355 09/05/2018 1303 Full Code [id number]  Bethena Roys, MD Inpatient   08/22/2018 1622 08/26/2018 1751 Full Code [id number]  Bufford Lope, DO ED      Home/SNF/Other Home  Chief Complaint cancer pt/ emesis   Level of Care/Admitting Diagnosis ED Disposition    ED Disposition Condition [facility] Area: [facility] [[id number]]  Level of Care: Med-Surg [16]  Diagnosis: SBO (small bowel obstruction) [facility]) [[id number]]  Admitting Physician: Dona Ana, Palmer  Attending Physician: CCS, MD [[id number]]  Estimated length of stay: > 1 week  Certification:: I certify this patient will need inpatient services for at least 2 midnights  Possible Covid Disease Patient Isolation: Low Risk  (Less than 4L [place] supplementation)  PT Class (Do Not Modify): Inpatient [101]  PT Acc Code (Do Not Modify): Private [1]       Medical History Past Medical History:  Diagnosis Date  . Chronic back pain 1989   broke back  . Hyperlipidemia   . Hypertension   . 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 Stroke [facility])     Allergies Allergies  Allergen Reactions  . Butterscotch Flavor Anaphylaxis and Shortness Of Breath    IV Location/Drains/Wounds Patient Lines/Drains/Airways Status   Active Line/Drains/Airways    Name:   Placement date:   Placement time:   Site:   Days:   Peripheral IV 08/30/18 Left Antecubital   08/30/18    1700    Antecubital   198   Peripheral IV 03/16/19 Right Forearm   03/16/19    1517    Forearm   less than 1   Incision (Closed) 08/23/18 Neck Right   08/23/18    0849     205          Labs/Imaging Results for orders placed or performed during the hospital encounter of 03/16/19 (from the past 48 hour(s))  CBC with Differential     Status: Abnormal    Collection Time: 03/16/19  3:12 PM  Result Value Ref Range   WBC 13.0 (H) 4.0 - 10.5 K/uL   RBC 4.85 4.22 - 5.81 MIL/uL   Hemoglobin 8.1 (L) 13.0 - 17.0 g/dL    Comment: Reticulocyte Hemoglobin testing may be clinically indicated, consider ordering this additional test [id number]    HCT 31.3 (L) 39.0 - 52.0 %   MCV 64.5 (L) 80.0 - 100.0 fL   MCH 16.7 (L) 26.0 - 34.0 pg   MCHC 25.9 (L) 30.0 - 36.0 g/dL   RDW 19.3 (H) 11.5 - 15.5 %   Platelets 679 (H) 150 - 400 K/uL   nRBC 0.0 0.0 - 0.2 %   Neutrophils Relative % 90 %   Neutro Abs 11.6 (H) 1.7 - 7.7 K/uL   Lymphocytes Relative 3 %   Lymphs Abs 0.4 (L) 0.7 - 4.0 K/uL   Monocytes Relative 6 %   Monocytes Absolute 0.8 0.1 - 1.0 K/uL   Eosinophils Relative 0 %   Eosinophils Absolute 0.0 0.0 - 0.5 K/uL   Basophils Relative 0 %   Basophils Absolute 0.0 0.0 - 0.1 K/uL   Immature Granulocytes 1 %   Abs Immature Granulocytes 0.14 (H) 0.00 - 0.07 K/uL   Polychromasia PRESENT    Ovalocytes PRESENT  Comment: Performed at [facility], [place] [address] [place], [place] [address]  Lipase, blood     Status: None   Collection Time: 03/16/19  3:12 PM  Result Value Ref Range   Lipase 22 11 - 51 U/L    Comment: Performed at [facility], [place] [address], [place], [place] [address]  Comprehensive metabolic panel     Status: Abnormal   Collection Time: 03/16/19  3:12 PM  Result Value Ref Range   Sodium 134 (L) 135 - 145 mmol/L   Potassium 4.3 3.5 - 5.1 mmol/L   Chloride 94 (L) 98 - 111 mmol/L   CO2 25 22 - 32 mmol/L   Glucose, Bld 151 (H) 70 - 99 mg/dL   BUN 24 (H) 6 - 20 mg/dL   Creatinine, Ser 0.77 0.61 - 1.24 mg/dL   Calcium 8.9 8.9 - 10.3 mg/dL   Total Protein 7.0 6.5 - 8.1 g/dL   Albumin 3.0 (L) 3.5 - 5.0 g/dL   AST 14 (L) 15 - 41 U/L   ALT 8 0 - 44 U/L   Alkaline Phosphatase 80 38 - 126 U/L   Total Bilirubin 0.4 0.3 - 1.2 mg/dL   GFR calc non Af Amer >60 >60 mL/min   GFR calc Af Amer >60  >60 mL/min   Anion gap 15 5 - 15    Comment: Performed at [facility], [place] [address], [place], [place] [address]  POC occult blood, ED     Status: Abnormal   Collection Time: 03/16/19  3:23 PM  Result Value Ref Range   Fecal Occult Bld POSITIVE (A) NEGATIVE  Type and screen     Status: None   Collection Time: 03/16/19  3:29 PM  Result Value Ref Range   ABO/RH(D) O POS    Antibody Screen NEG    Sample Expiration      03/19/2019 Performed at [facility], [place] [address], [place], [place] [address]   ABO/Rh     Status: None (Preliminary result)   Collection Time: 03/16/19  3:29 PM  Result Value Ref Range   ABO/RH(D)      O POS Performed at [facility], [place] [address] [place], [place] [address]   Lactic acid, plasma     Status: Abnormal   Collection Time: 03/16/19  3:46 PM  Result Value Ref Range   Lactic Acid, Venous 4.5 (HH) 0.5 - 1.9 mmol/L    Comment: CRITICAL RESULT CALLED TO, READ BACK BY AND VERIFIED WITH: Ouida Sills [id number] @ [place] Performed at [place] [address], [place], [place] [address]   Urinalysis, Routine w reflex microscopic     Status: Abnormal   Collection Time: 03/16/19  5:05 PM  Result Value Ref Range   Color, Urine YELLOW YELLOW   APPearance CLEAR CLEAR   Specific Gravity, Urine >1.046 (H) 1.005 - 1.030   pH 5.0 5.0 - 8.0   Glucose, UA NEGATIVE NEGATIVE mg/dL   Hgb urine dipstick NEGATIVE NEGATIVE   Bilirubin Urine NEGATIVE NEGATIVE   Ketones, ur NEGATIVE NEGATIVE mg/dL   Protein, ur NEGATIVE NEGATIVE mg/dL   Nitrite NEGATIVE NEGATIVE   Leukocytes,Ua NEGATIVE NEGATIVE    Comment: Performed at [place] [address] [place], [place] [address]   Ct Angio Chest Pe W And/or Wo Contrast  Result Date: 03/16/2019 CLINICAL DATA:  Weakness with lower abdominal pain 1 day. Diagnosed with  lung cancer last year. EXAM: CT ANGIOGRAPHY  CHEST CT ABDOMEN AND PELVIS WITH CONTRAST TECHNIQUE: Multidetector CT imaging of the chest was performed using the standard protocol during bolus administration of intravenous contrast. Multiplanar CT image reconstructions and MIPs were obtained to evaluate the vascular anatomy. Multidetector CT imaging of the abdomen and pelvis was performed using the standard protocol during bolus administration of intravenous contrast. CONTRAST:  130mL OMNIPAQUE IOHEXOL 350 MG/ML SOLN COMPARISON:  Chest CT 08/30/2018 and abdominal CT 06/30/2016 FINDINGS: CTA CHEST FINDINGS Cardiovascular: Heart is normal size. Thoracic aorta is normal in caliber with minimal calcified plaque present. Pulmonary arterial system is within normal. Persistent obstruction of the right upper lobar arteries. Remaining vascular structures are unremarkable. Mediastinum/Nodes: Significant decrease in size in patient's previously seen anterior mediastinal mass over the right side of the anterior mediastinum with mild residual density in this region extending down to the right hilum. This residual mass measures approximately 2.9 x 3.9 cm in transverse in AP dimension (previously 13.1 x 10.5 cm). The right hilar component of this mass measures 2.2 cm in AP dimension (previously 5.7 cm). 1.4 cm left infrahilar node. New 2.4 cm soft tissue nodule abutting the right heart border and new 2 cm soft tissue nodule abutting the left anterolateral heart border just above the level of the ventricular septum. Lungs/Pleura: Lungs are adequately inflated and demonstrate resolution of several small right-sided pulmonary nodules. There is new opacification with bronchiectatic change over the medial right lung with areas of nodularity likely post radiation change. Stable nodule over the medial left upper lobe measuring 12 x 15 mm. Increase in size of a nodule over the posterior left lower lobe measuring 13 mm. Minimal emphysematous disease. No evidence of pleural effusion.  Musculoskeletal: Mild degenerative change of the spine. Review of the MIP images confirms the above findings. CT ABDOMEN and PELVIS FINDINGS Hepatobiliary: 2.3 cm hypodense mass over the posterior segment right lobe of the liver as well as 1.3 cm hypodense mass over the anterior right lobe of the liver likely representing metastatic disease. Subcentimeter hypodensity over the right lobe which may represent a small cyst versus metastatic disease. Previous cholecystectomy. Biliary tree is unremarkable. Pancreas: Normal Spleen: Normal. Adrenals/Urinary Tract: New 1.8 cm left adrenal nodule likely metastatic focus. Right adrenal gland is normal. Kidneys are normal in size without hydronephrosis or nephrolithiasis. There are a couple subcentimeter right renal cortical hypodensities too small to characterize but likely cysts. Ureters and bladder are normal. Stomach/Bowel: Stomach is within normal. There are multiple fluid and air-filled dilated small bowel loops measuring up to 5.4 cm in diameter. There is an abrupt transition 0.8 in the midline abdomen at the level of the umbilicus with there is an ileo ileal intussusception possibly due to a small lead point mass. There is a second ileocolic intussusception in the right lower quadrant likely due to additional bowel masses. This ileocolic intussusception extends to the hepatic flexure. The colon is decompressed distal to the hepatic flexure. Vascular/Lymphatic: Moderate calcified plaque over the abdominal aorta. Portacaval lymph node measuring 2.2 cm in diameter with adjacent celiac axis node measuring 1.8 cm in diameter. Reproductive: Normal. Other: No significant free fluid or free peritoneal air. Musculoskeletal: Degenerative change of the spine and hips. Review of the MIP images confirms the above findings. IMPRESSION: 1. Small bowel obstruction with abrupt transition point in the midline abdomen at the level of the umbilicus due to ileo ileal intussusception likely  secondary to small bowel mass which is likely metastatic  disease. There is a second intussusception in the right lower quadrant as there is an ileocolic intussusception extending to the hepatic flexure likely due to multiple bowel masses. 2. Significant decrease in size of patient's anterior mediastinal mass as the residual mass measures approximately 2.9 x 3.9 cm (previously 13.1 x 10.5 cm). Decreased size of right hilar component of this mass. 1.4 cm low left infrahilar lymph node. New metastatic foci abutting the right and left heart border as described. Resolution of several small right pulmonary nodules with stable and increased size of left pulmonary nodules as described. 3. 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 4. Two new hypodense liver masses with the larger measuring 2.3 cm over the right lobe likely metastatic disease. New 1.8 cm left adrenal nodule likely metastatic disease. Worsening adenopathy in the region of the celiac axis and portacaval region likely metastatic disease. 5. A few subcentimeter right renal cortical hypodensities too small to characterize but likely cysts. 6. Aortic Atherosclerosis (ICD10-I70.0) and Emphysema (ICD10-J43.9). These results were called by telephone at the time of interpretation on 03/16/2019 at 5:15 pm to PA, [facility] , who verbally acknowledged these results. Electronically Signed   By: Marin Olp M.D.   On: 03/16/2019 17:16   Ct Abdomen Pelvis W Contrast  Result Date: 03/16/2019 CLINICAL DATA:  Weakness with lower abdominal pain 1 day. Diagnosed with lung cancer last year. EXAM: CT ANGIOGRAPHY CHEST CT ABDOMEN AND PELVIS WITH CONTRAST TECHNIQUE: Multidetector CT imaging of the chest was performed using the standard protocol during bolus administration of intravenous contrast. Multiplanar CT image reconstructions and MIPs were obtained to evaluate the vascular anatomy. Multidetector CT  imaging of the abdomen and pelvis was performed using the standard protocol during bolus administration of intravenous contrast. CONTRAST:  163mL OMNIPAQUE IOHEXOL 350 MG/ML SOLN COMPARISON:  Chest CT 08/30/2018 and abdominal CT 06/30/2016 FINDINGS: CTA CHEST FINDINGS Cardiovascular: Heart is normal size. Thoracic aorta is normal in caliber with minimal calcified plaque present. Pulmonary arterial system is within normal. Persistent obstruction of the right upper lobar arteries. Remaining vascular structures are unremarkable. Mediastinum/Nodes: Significant decrease in size in patient's previously seen anterior mediastinal mass over the right side of the anterior mediastinum with mild residual density in this region extending down to the right hilum. This residual mass measures approximately 2.9 x 3.9 cm in transverse in AP dimension (previously 13.1 x 10.5 cm). The right hilar component of this mass measures 2.2 cm in AP dimension (previously 5.7 cm). 1.4 cm left infrahilar node. New 2.4 cm soft tissue nodule abutting the right heart border and new 2 cm soft tissue nodule abutting the left anterolateral heart border just above the level of the ventricular septum. Lungs/Pleura: Lungs are adequately inflated and demonstrate resolution of several small right-sided pulmonary nodules. There is new opacification with bronchiectatic change over the medial right lung with areas of nodularity likely post radiation change. Stable nodule over the medial left upper lobe measuring 12 x 15 mm. Increase in size of a nodule over the posterior left lower lobe measuring 13 mm. Minimal emphysematous disease. No evidence of pleural effusion. Musculoskeletal: Mild degenerative change of the spine. Review of the MIP images confirms the above findings. CT ABDOMEN and PELVIS FINDINGS Hepatobiliary: 2.3 cm hypodense mass over the posterior segment right lobe of the liver as well as 1.3 cm hypodense mass over the anterior right lobe of the  liver likely representing metastatic disease. Subcentimeter hypodensity over the right  lobe which may represent a small cyst versus metastatic disease. Previous cholecystectomy. Biliary tree is unremarkable. Pancreas: Normal Spleen: Normal. Adrenals/Urinary Tract: New 1.8 cm left adrenal nodule likely metastatic focus. Right adrenal gland is normal. Kidneys are normal in size without hydronephrosis or nephrolithiasis. There are a couple subcentimeter right renal cortical hypodensities too small to characterize but likely cysts. Ureters and bladder are normal. Stomach/Bowel: Stomach is within normal. There are multiple fluid and air-filled dilated small bowel loops measuring up to 5.4 cm in diameter. There is an abrupt transition 0.8 in the midline abdomen at the level of the umbilicus with there is an ileo ileal intussusception possibly due to a small lead point mass. There is a second ileocolic intussusception in the right lower quadrant likely due to additional bowel masses. This ileocolic intussusception extends to the hepatic flexure. The colon is decompressed distal to the hepatic flexure. Vascular/Lymphatic: Moderate calcified plaque over the abdominal aorta. Portacaval lymph node measuring 2.2 cm in diameter with adjacent celiac axis node measuring 1.8 cm in diameter. Reproductive: Normal. Other: No significant free fluid or free peritoneal air. Musculoskeletal: Degenerative change of the spine and hips. Review of the MIP images confirms the above findings. IMPRESSION: 1. Small bowel obstruction with abrupt transition point in the midline abdomen at the level of the umbilicus due to ileo ileal intussusception likely secondary to small bowel mass which is likely metastatic disease. There is a second intussusception in the right lower quadrant as there is an ileocolic intussusception extending to the hepatic flexure likely due to multiple bowel masses. 2. Significant decrease in size of patient's anterior  mediastinal mass as the residual mass measures approximately 2.9 x 3.9 cm (previously 13.1 x 10.5 cm). Decreased size of right hilar component of this mass. 1.4 cm low left infrahilar lymph node. New metastatic foci abutting the right and left heart border as described. Resolution of several small right pulmonary nodules with stable and increased size of left pulmonary nodules as described. 3. 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 4. Two new hypodense liver masses with the larger measuring 2.3 cm over the right lobe likely metastatic disease. New 1.8 cm left adrenal nodule likely metastatic disease. Worsening adenopathy in the region of the celiac axis and portacaval region likely metastatic disease. 5. A few subcentimeter right renal cortical hypodensities too small to characterize but likely cysts. 6. Aortic Atherosclerosis (ICD10-I70.0) and Emphysema (ICD10-J43.9). These results were called by telephone at the time of interpretation on 03/16/2019 at 5:15 pm to PA, [facility] , who verbally acknowledged these results. Electronically Signed   By: Marin Olp M.D.   On: 03/16/2019 17:16    Pending Labs Unresulted Labs (From admission, onward)    Start     Ordered   03/16/19 1645  Blood culture (routine x 2)  BLOOD CULTURE X 2,   STAT     03/16/19 1644   03/16/19 1645  Urine culture  ONCE - STAT,   STAT     03/16/19 1644   03/16/19 1430  Lactic acid, plasma  Now then every 2 hours,   STAT     03/16/19 1434   Signed and Held  APTT  Once,   R     Signed and Held   Signed and Held  Protime-INR  Once,   R     Signed and Held   Signed and [employer]  Tomorrow morning,   R  Signed and Held   Signed and Held  CBC  Tomorrow morning,   R     Signed and Held          Vitals/Pain Today's Vitals   03/16/19 1851 03/16/19 1930 03/16/19 2003 03/16/19 2030  BP:  133/77  138/81  Pulse:  94  99  Resp:  15  20   Temp:      TempSrc:      SpO2:  100%  99%  Weight:      Height:      PainSc: 5   0-No pain     Isolation Precautions No active isolations  Medications Medications  sodium chloride 0.9 % bolus 1,000 mL (0 mLs Intravenous Stopped 03/16/19 1622)  ondansetron (ZOFRAN) injection 4 mg (4 mg Intravenous Given 03/16/19 1519)  morphine 4 MG/ML injection 4 mg (4 mg Intravenous Given 03/16/19 1520)  sodium chloride (PF) 0.9 % injection (  Return to [facility] 03/16/19 1713)  iohexol (OMNIPAQUE) 350 MG/ML injection 100 mL (100 mLs Intravenous Contrast Given 03/16/19 1622)  morphine 4 MG/ML injection 4 mg (4 mg Intravenous Given 03/16/19 1712)  piperacillin-tazobactam (ZOSYN) IVPB 3.375 g (0 g Intravenous Stopped 03/16/19 1805)  sodium chloride 0.9 % bolus 1,000 mL (0 mLs Intravenous Stopped 03/16/19 1954)    Mobility walks

## 2019-03-16 NOTE — ED Notes (Signed)
Dr. Rush Farmer at bedside.

## 2019-03-16 NOTE — ED Notes (Signed)
Attempted to call report, RN unavailable.

## 2019-03-16 NOTE — ED Provider Notes (Addendum)
[facility] DEPT Provider Note   CSN: [id number] Arrival date & time: 03/16/19  1359    History   Chief Complaint Chief Complaint  Patient presents with   Nausea   Diarrhea   Emesis   Abdominal Pain   Fatigue    HPI PREVIN JIAN is a 63 y.o. male.     ELIO HADEN is a 63 y.o. male metastatic melanoma with lung mass, SVC syndrome, hypertension, hyperlipidemia, stroke, who presents to the emergency department for evaluation of abdominal pain, nausea, vomiting, diarrhea and fatigue.  Symptoms have been present and worsening over the past 2 to 3 days.  She reports that he has had vomiting intermittently ever since he was diagnosed with cancer but it has been worse than usual and he reports his emesis and diarrhea have intermittently been dark red to black.  He reports abdominal pain is located primarily in the middle and the right lower quadrants it is a constant pain, nothing makes it better or worse.  No associated fevers.  Has had some chills.  He reports worsening fatigue.  No lightheadedness or syncope but generalized weakness.  He denies any associated chest pain, has shortness of breath at baseline that is no worse than usual.  Has not had a productive cough.  He reports that he has not seen his oncologist, Dr. Earlie Server since the end of October, he tried 1 round of Keytruda but did not tolerate the side effects of this and decided not to continue.  He did undergo palliative radiation of the right hilar mass which improved his SVC syndrome, has not undergone any other treatments for his malignant melanoma at this time.  Reports he has been home very consistently for the past 3 months, no known sick contacts or any recent travel.     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[place])    remote   Stroke [facility])     Patient Active Problem List   Diagnosis Date Noted   Encounter for antineoplastic immunotherapy 09/22/2018   Iron deficiency anemia 09/03/2018   Mediastinal mass    Goals of care, counseling/discussion    Palliative care by specialist    Advance care planning    Adjustment insomnia    Lung mass 09/01/2018   Melanoma ([place]) 09/01/2018   Chronic back pain 09/01/2018   Cough 09/01/2018   Dyspnea 08/30/2018   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    Past Surgical History:  Procedure Laterality Date   APPENDECTOMY     BACK SURGERY     lumbar spine, cut sciatic nerve   CHOLECYSTECTOMY     SCALENE NODE BIOPSY Right 08/23/2018   Procedure: BIOPSY OF RIGHT SCALENE LYMPH NODE;  Surgeon: Grace Isaac, MD;  Location: [facility];  Service: Thoracic;  Laterality: Right;        Home Medications    Prior to Admission medications   Medication Sig Start Date End Date Taking? Authorizing Provider  Alpha-D-Galactosidase (BEANO PO) Take 1 tablet by mouth daily as needed (gas).   Yes [provider]  Aspirin-Salicylamide-Caffeine (BC HEADACHE POWDER PO) Take 1 packet by mouth 3 (three) times daily as needed (headache, pain).   Yes [provider]    Family History Family History  Problem Relation Age of Onset   Breast cancer Mother        multiple types  Lung cancer Maternal Aunt        nonsmoking   Pancreatic cancer Maternal Aunt     Social History Social History   Tobacco Use   Smoking status: Former Smoker    Packs/day: 1.50    Years: 33.00    Pack years: 49.50    Last attempt to quit: 1999    Years since quitting: 21.3   Smokeless tobacco: Never Used  Substance Use Topics   Alcohol use: Yes   Drug use: Yes    Frequency: 3.0 times per week    Types: Marijuana     Allergies   Butterscotch flavor   Review of Systems Review of Systems  Constitutional: Positive for chills and fatigue. Negative for fever.  HENT: Negative for congestion, rhinorrhea and sore throat.     Eyes: Negative for visual disturbance.  Respiratory: Positive for shortness of breath. Negative for cough.   Cardiovascular: Negative for chest pain.  Gastrointestinal: Positive for abdominal pain, blood in stool, diarrhea, nausea and vomiting. Negative for constipation.  Genitourinary: Negative for dysuria, flank pain, frequency and hematuria.  Musculoskeletal: Negative for arthralgias and myalgias.  Skin: Positive for pallor. Negative for color change and wound.  Neurological: Positive for weakness (Generalized). Negative for dizziness, syncope and light-headedness.     Physical Exam Updated Vital Signs BP 125/86 (BP Location: Left Arm)    Pulse (!) 121    Temp 97.8 F (36.6 C) (Oral)    Resp (!) 26    SpO2 100%   Physical Exam Vitals signs and nursing note reviewed.  Constitutional:      General: He is not in acute distress.    Appearance: He is well-developed. He is ill-appearing. He is not diaphoretic.     Comments: Patient is pale and cachectic, somewhat ill-appearing but not in any acute distress  HENT:     Head: Normocephalic and atraumatic.  Eyes:     General:        Right eye: No discharge.        Left eye: No discharge.     Pupils: Pupils are equal, round, and reactive to light.  Neck:     Musculoskeletal: Neck supple.  Cardiovascular:     Rate and Rhythm: Regular rhythm. Tachycardia present.     Heart sounds: Normal heart sounds. No murmur. No friction rub. No gallop.   Pulmonary:     Effort: Pulmonary effort is normal. No respiratory distress.     Breath sounds: Normal breath sounds. No wheezing or rales.     Comments: Respirations equal and unlabored, patient able to speak in full sentences, lungs clear to auscultation bilaterally Abdominal:     General: Bowel sounds are normal. There is no distension.     Palpations: Abdomen is soft. There is no mass.     Tenderness: There is abdominal tenderness in the right lower quadrant and periumbilical area. There is no  guarding.     Comments: Abdomen is soft, nondistended, there is tenderness in the periumbilical region and right lower quadrant mother is some mild guarding in the right lower quadrant.  No rebound tenderness.  Genitourinary:    Comments: Rectal exam reveals soft burgundy stools Musculoskeletal:        General: No deformity.     Comments: No lower extremity edema bilaterally.  Left hand with digit amputations.  Skin:    General: Skin is warm and dry.     Capillary Refill: Capillary refill takes less than 2 seconds.  Coloration: Skin is pale.     Comments: Skin is pale  Neurological:     Mental Status: He is alert.     Coordination: Coordination normal.     Comments: Speech is clear, able to follow commands Moves extremities without ataxia, coordination intact   Psychiatric:        Mood and Affect: Mood normal.        Behavior: Behavior normal.      ED Treatments / Results  Labs (all labs ordered are listed, but only abnormal results are displayed) Labs Reviewed  CBC WITH DIFFERENTIAL/PLATELET - Abnormal; Notable for the following components:      Result Value   WBC 13.0 (*)    Hemoglobin 8.1 (*)    HCT 31.3 (*)    MCV 64.5 (*)    MCH 16.7 (*)    MCHC 25.9 (*)    RDW 19.3 (*)    Platelets 679 (*)    Neutro Abs 11.6 (*)    Lymphs Abs 0.4 (*)    Abs Immature Granulocytes 0.14 (*)    All other components within normal limits  COMPREHENSIVE METABOLIC PANEL - Abnormal; Notable for the following components:   Sodium 134 (*)    Chloride 94 (*)    Glucose, Bld 151 (*)    BUN 24 (*)    Albumin 3.0 (*)    AST 14 (*)    All other components within normal limits  URINALYSIS, ROUTINE W REFLEX MICROSCOPIC - Abnormal; Notable for the following components:   Specific Gravity, Urine >1.046 (*)    All other components within normal limits  LACTIC ACID, PLASMA - Abnormal; Notable for the following components:   Lactic Acid, Venous 4.5 (*)    All other components within normal  limits  POC OCCULT BLOOD, ED - Abnormal; Notable for the following components:   Fecal Occult Bld POSITIVE (*)    All other components within normal limits  CULTURE, BLOOD (ROUTINE X 2)  CULTURE, BLOOD (ROUTINE X 2)  URINE CULTURE  LIPASE, BLOOD  LACTIC ACID, PLASMA  TYPE AND SCREEN    EKG None  Radiology Ct Angio Chest Pe W And/or Wo Contrast  Result Date: 03/16/2019 CLINICAL DATA:  Weakness with lower abdominal pain 1 day. Diagnosed with lung cancer last year. EXAM: CT ANGIOGRAPHY CHEST CT ABDOMEN AND PELVIS WITH CONTRAST TECHNIQUE: Multidetector CT imaging of the chest was performed using the standard protocol during bolus administration of intravenous contrast. Multiplanar CT image reconstructions and MIPs were obtained to evaluate the vascular anatomy. Multidetector CT imaging of the abdomen and pelvis was performed using the standard protocol during bolus administration of intravenous contrast. CONTRAST:  12mL OMNIPAQUE IOHEXOL 350 MG/ML SOLN COMPARISON:  Chest CT 08/30/2018 and abdominal CT 06/30/2016 FINDINGS: CTA CHEST FINDINGS Cardiovascular: Heart is normal size. Thoracic aorta is normal in caliber with minimal calcified plaque present. Pulmonary arterial system is within normal. Persistent obstruction of the right upper lobar arteries. Remaining vascular structures are unremarkable. Mediastinum/Nodes: Significant decrease in size in patient's previously seen anterior mediastinal mass over the right side of the anterior mediastinum with mild residual density in this region extending down to the right hilum. This residual mass measures approximately 2.9 x 3.9 cm in transverse in AP dimension (previously 13.1 x 10.5 cm). The right hilar component of this mass measures 2.2 cm in AP dimension (previously 5.7 cm). 1.4 cm left infrahilar node. New 2.4 cm soft tissue nodule abutting the right heart border and new 2  cm soft tissue nodule abutting the left anterolateral heart border just above  the level of the ventricular septum. Lungs/Pleura: Lungs are adequately inflated and demonstrate resolution of several small right-sided pulmonary nodules. There is new opacification with bronchiectatic change over the medial right lung with areas of nodularity likely post radiation change. Stable nodule over the medial left upper lobe measuring 12 x 15 mm. Increase in size of a nodule over the posterior left lower lobe measuring 13 mm. Minimal emphysematous disease. No evidence of pleural effusion. Musculoskeletal: Mild degenerative change of the spine. Review of the MIP images confirms the above findings. CT ABDOMEN and PELVIS FINDINGS Hepatobiliary: 2.3 cm hypodense mass over the posterior segment right lobe of the liver as well as 1.3 cm hypodense mass over the anterior right lobe of the liver likely representing metastatic disease. Subcentimeter hypodensity over the right lobe which may represent a small cyst versus metastatic disease. Previous cholecystectomy. Biliary tree is unremarkable. Pancreas: Normal Spleen: Normal. Adrenals/Urinary Tract: New 1.8 cm left adrenal nodule likely metastatic focus. Right adrenal gland is normal. Kidneys are normal in size without hydronephrosis or nephrolithiasis. There are a couple subcentimeter right renal cortical hypodensities too small to characterize but likely cysts. Ureters and bladder are normal. Stomach/Bowel: Stomach is within normal. There are multiple fluid and air-filled dilated small bowel loops measuring up to 5.4 cm in diameter. There is an abrupt transition 0.8 in the midline abdomen at the level of the umbilicus with there is an ileo ileal intussusception possibly due to a small lead point mass. There is a second ileocolic intussusception in the right lower quadrant likely due to additional bowel masses. This ileocolic intussusception extends to the hepatic flexure. The colon is decompressed distal to the hepatic flexure. Vascular/Lymphatic: Moderate  calcified plaque over the abdominal aorta. Portacaval lymph node measuring 2.2 cm in diameter with adjacent celiac axis node measuring 1.8 cm in diameter. Reproductive: Normal. Other: No significant free fluid or free peritoneal air. Musculoskeletal: Degenerative change of the spine and hips. Review of the MIP images confirms the above findings. IMPRESSION: 1. Small bowel obstruction with abrupt transition point in the midline abdomen at the level of the umbilicus due to ileo ileal intussusception likely secondary to small bowel mass which is likely metastatic disease. There is a second intussusception in the right lower quadrant as there is an ileocolic intussusception extending to the hepatic flexure likely due to multiple bowel masses. 2. Significant decrease in size of patient's anterior mediastinal mass as the residual mass measures approximately 2.9 x 3.9 cm (previously 13.1 x 10.5 cm). Decreased size of right hilar component of this mass. 1.4 cm low left infrahilar lymph node. New metastatic foci abutting the right and left heart border as described. Resolution of several small right pulmonary nodules with stable and increased size of left pulmonary nodules as described. 3. 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 4. Two new hypodense liver masses with the larger measuring 2.3 cm over the right lobe likely metastatic disease. New 1.8 cm left adrenal nodule likely metastatic disease. Worsening adenopathy in the region of the celiac axis and portacaval region likely metastatic disease. 5. A few subcentimeter right renal cortical hypodensities too small to characterize but likely cysts. 6. Aortic Atherosclerosis (ICD10-I70.0) and Emphysema (ICD10-J43.9). These results were called by telephone at the time of interpretation on 03/16/2019 at 5:15 pm to PA, [facility] , who verbally acknowledged these results. Electronically Signed   By:  Marin Olp M.D.   On: 03/16/2019 17:16   Ct Abdomen Pelvis W Contrast  Result Date: 03/16/2019 CLINICAL DATA:  Weakness with lower abdominal pain 1 day. Diagnosed with lung cancer last year. EXAM: CT ANGIOGRAPHY CHEST CT ABDOMEN AND PELVIS WITH CONTRAST TECHNIQUE: Multidetector CT imaging of the chest was performed using the standard protocol during bolus administration of intravenous contrast. Multiplanar CT image reconstructions and MIPs were obtained to evaluate the vascular anatomy. Multidetector CT imaging of the abdomen and pelvis was performed using the standard protocol during bolus administration of intravenous contrast. CONTRAST:  14mL OMNIPAQUE IOHEXOL 350 MG/ML SOLN COMPARISON:  Chest CT 08/30/2018 and abdominal CT 06/30/2016 FINDINGS: CTA CHEST FINDINGS Cardiovascular: Heart is normal size. Thoracic aorta is normal in caliber with minimal calcified plaque present. Pulmonary arterial system is within normal. Persistent obstruction of the right upper lobar arteries. Remaining vascular structures are unremarkable. Mediastinum/Nodes: Significant decrease in size in patient's previously seen anterior mediastinal mass over the right side of the anterior mediastinum with mild residual density in this region extending down to the right hilum. This residual mass measures approximately 2.9 x 3.9 cm in transverse in AP dimension (previously 13.1 x 10.5 cm). The right hilar component of this mass measures 2.2 cm in AP dimension (previously 5.7 cm). 1.4 cm left infrahilar node. New 2.4 cm soft tissue nodule abutting the right heart border and new 2 cm soft tissue nodule abutting the left anterolateral heart border just above the level of the ventricular septum. Lungs/Pleura: Lungs are adequately inflated and demonstrate resolution of several small right-sided pulmonary nodules. There is new opacification with bronchiectatic change over the medial right lung with areas of nodularity likely post radiation change.  Stable nodule over the medial left upper lobe measuring 12 x 15 mm. Increase in size of a nodule over the posterior left lower lobe measuring 13 mm. Minimal emphysematous disease. No evidence of pleural effusion. Musculoskeletal: Mild degenerative change of the spine. Review of the MIP images confirms the above findings. CT ABDOMEN and PELVIS FINDINGS Hepatobiliary: 2.3 cm hypodense mass over the posterior segment right lobe of the liver as well as 1.3 cm hypodense mass over the anterior right lobe of the liver likely representing metastatic disease. Subcentimeter hypodensity over the right lobe which may represent a small cyst versus metastatic disease. Previous cholecystectomy. Biliary tree is unremarkable. Pancreas: Normal Spleen: Normal. Adrenals/Urinary Tract: New 1.8 cm left adrenal nodule likely metastatic focus. Right adrenal gland is normal. Kidneys are normal in size without hydronephrosis or nephrolithiasis. There are a couple subcentimeter right renal cortical hypodensities too small to characterize but likely cysts. Ureters and bladder are normal. Stomach/Bowel: Stomach is within normal. There are multiple fluid and air-filled dilated small bowel loops measuring up to 5.4 cm in diameter. There is an abrupt transition 0.8 in the midline abdomen at the level of the umbilicus with there is an ileo ileal intussusception possibly due to a small lead point mass. There is a second ileocolic intussusception in the right lower quadrant likely due to additional bowel masses. This ileocolic intussusception extends to the hepatic flexure. The colon is decompressed distal to the hepatic flexure. Vascular/Lymphatic: Moderate calcified plaque over the abdominal aorta. Portacaval lymph node measuring 2.2 cm in diameter with adjacent celiac axis node measuring 1.8 cm in diameter. Reproductive: Normal. Other: No significant free fluid or free peritoneal air. Musculoskeletal: Degenerative change of the spine and hips.  Review of the MIP images confirms the above findings. IMPRESSION: 1.  Small bowel obstruction with abrupt transition point in the midline abdomen at the level of the umbilicus due to ileo ileal intussusception likely secondary to small bowel mass which is likely metastatic disease. There is a second intussusception in the right lower quadrant as there is an ileocolic intussusception extending to the hepatic flexure likely due to multiple bowel masses. 2. Significant decrease in size of patient's anterior mediastinal mass as the residual mass measures approximately 2.9 x 3.9 cm (previously 13.1 x 10.5 cm). Decreased size of right hilar component of this mass. 1.4 cm low left infrahilar lymph node. New metastatic foci abutting the right and left heart border as described. Resolution of several small right pulmonary nodules with stable and increased size of left pulmonary nodules as described. 3. 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 4. Two new hypodense liver masses with the larger measuring 2.3 cm over the right lobe likely metastatic disease. New 1.8 cm left adrenal nodule likely metastatic disease. Worsening adenopathy in the region of the celiac axis and portacaval region likely metastatic disease. 5. A few subcentimeter right renal cortical hypodensities too small to characterize but likely cysts. 6. Aortic Atherosclerosis (ICD10-I70.0) and Emphysema (ICD10-J43.9). These results were called by telephone at the time of interpretation on 03/16/2019 at 5:15 pm to PA, [facility] , who verbally acknowledged these results. Electronically Signed   By: Marin Olp M.D.   On: 03/16/2019 17:16    Procedures Procedures (including critical care time)  Medications Ordered in ED Medications  sodium chloride 0.9 % bolus 1,000 mL (0 mLs Intravenous Stopped 03/16/19 1622)  ondansetron (ZOFRAN) injection 4 mg (4 mg Intravenous Given 03/16/19 1519)    morphine 4 MG/ML injection 4 mg (4 mg Intravenous Given 03/16/19 1520)  sodium chloride (PF) 0.9 % injection (  Return to [facility] 03/16/19 1713)  iohexol (OMNIPAQUE) 350 MG/ML injection 100 mL (100 mLs Intravenous Contrast Given 03/16/19 1622)  morphine 4 MG/ML injection 4 mg (4 mg Intravenous Given 03/16/19 1712)  piperacillin-tazobactam (ZOSYN) IVPB 3.375 g (0 g Intravenous Stopped 03/16/19 1805)  sodium chloride 0.9 % bolus 1,000 mL (1,000 mLs Intravenous New Bag/Given 03/16/19 1851)     Initial Impression / Assessment and Plan / ED Course  I have reviewed the triage vital signs and the nursing notes.  Pertinent labs & imaging results that were available during my care of the patient were reviewed by me and considered in my medical decision making (see chart for details).  Patient presents with abdominal pain, nausea vomiting and loose stools.  Has had some bloody diarrhea and emesis as well.  Denies fevers but reports chills, fatigue and generalized weakness.  Is not been on any recent treatment for his metastatic cancer.  Denies chest pain, reports his shortness of breath is at baseline and he has no productive cough.  On exam patient is focally tender in the periumbilical and right lower quadrants with some mild guarding in the right lower quadrant.  Patient is tachycardic but all other vitals stable.  Will check abdominal labs, lactic acid, Hemoccult, type and screen.  Patient appears very pale and with reported bloody stools concern patient may have some element of GI bleeding, could be related to metastasis from cancer.  We will also check CT Angio of the chest and CT abdomen pelvis with contrast.  No leukocytosis of 13.8 with left shift, hemoglobin is 8.1, was previously 9.5.  Will hold off on any transfusion at this  time.  Patient with reactive thrombocytosis.  No acute electrolyte derangements requiring intervention, normal renal and liver function.  Normal lipase.  Urinalysis without signs  of infection.  Lactic acid elevated at 4.5, again patient remains afebrile, tachycardia has resolved with fluids and patient is not hypotensive, he does not meet criteria for severe sepsis at this time, I think this lactic acid may be multifactorial and related to dehydration as well as potential intra-abdominal pathology will hold on antibiotics at this time awaiting abdominal CT.  5:15 PM called by radiology regarding results of CT chest abdomen pelvis.  Patient with a small bowel obstruction caused by 2 separate instances of intussusception, one ileoileal at the midline and the second in the right lower quadrant, ileocolic extending up to the hepatic flexure.  There are no obvious signs of perforation or necrosis.  Patient also has new metastasis noted in the bowel as well as the liver.  Right hilar mass noted which has decreased in size since patient had palliative radiation.  Given elevated lactic acid and leukocytosis with this intra-abdominal pathology, will give dose of IV Zosyn.  Pain has been well controlled with morphine.  No further emesis since arriving here in the department, will keep patient n.p.o.  5:20 PM Consult placed to general surgery regarding SBO secondary to two instances of intussusception, ileo-ileal and ileo-colic, transition point is likely metastasis.  7:35 PM Case discussed with Dr. Ninfa Linden with general surgery, who will see the patient and admit the patient.  Had initially discussed with medicine service for admission, but surgery plans to admit the patient, plans to rehydrate overnight with potential ex lap in the morning.  Final Clinical Impressions(s) / ED Diagnoses   Final diagnoses:  Small bowel obstruction ([place])  Intussusception ([place])  Metastatic melanoma [facility])    ED Discharge Orders    None       Janet Berlin 03/16/19 2124    Jacqlyn Larsen, PA-C 03/16/19 2125    Julianne Rice, MD 03/18/19 [phone]

## 2019-03-16 NOTE — Progress Notes (Signed)
PHARMACY NOTE -  [place] has been assisting with dosing of Zosyn for IAI.  Dosage remains stable at 3.375 g IV q8 hr and need for further dosage adjustment appears unlikely at present given good renal function  Pharmacy will sign off, following peripherally for culture results or dose adjustments. Please reconsult if a change in clinical status warrants re-evaluation of dosage.  Reuel Boom, PharmD, BCPS [phone] 03/16/2019, 9:15 PM

## 2019-03-16 NOTE — Plan of Care (Signed)
Plan of care discussed.   

## 2019-03-16 NOTE — ED Notes (Signed)
Pt in CT at this time.

## 2019-03-16 NOTE — ED Notes (Signed)
Date and time results received: 03/16/19 1642 (use smartphrase ".now" to insert current time)  Test: lactic acid  Critical Value: 4.5  Name of Provider Notified: Benedetto Goad  Orders Received? Or Actions Taken?: reported lactic acid 4.5 to [employer].

## 2019-03-16 NOTE — ED Triage Notes (Signed)
Pt states he has been getting sick for the last two days Last time vomiting was 03/16/2019  PER PATIENT If you stick him with a needle his heart will stop

## 2019-03-17 ENCOUNTER — Inpatient Hospital Stay (HOSPITAL_COMMUNITY): Payer: Medicare Other | Admitting: Certified Registered Nurse Anesthetist

## 2019-03-17 ENCOUNTER — Encounter (HOSPITAL_COMMUNITY): Admission: EM | Disposition: A | Discharge status: Home Health | Payer: Self-pay | Source: Home / Self Care

## 2019-03-17 ENCOUNTER — Encounter (HOSPITAL_COMMUNITY): Payer: Self-pay | Admitting: Emergency Medicine

## 2019-03-17 HISTORY — PX: LAPAROTOMY: SHX154

## 2019-03-17 LAB — URINE CULTURE: Culture: NO GROWTH

## 2019-03-17 LAB — PREPARE RBC (CROSSMATCH)

## 2019-03-17 LAB — POCT I-STAT 4, (NA,K, GLUC, HGB,HCT)
Glucose, Bld: 163 mg/dL — ABNORMAL HIGH (ref 70–99)
HCT: 24 % — ABNORMAL LOW (ref 39.0–52.0)
Hemoglobin: 8.2 g/dL — ABNORMAL LOW (ref 13.0–17.0)
Potassium: 4.7 mmol/L (ref 3.5–5.1)
Sodium: 137 mmol/L (ref 135–145)

## 2019-03-17 LAB — SURGICAL PCR SCREEN
MRSA, PCR: NEGATIVE
Staphylococcus aureus: NEGATIVE

## 2019-03-17 LAB — CBC
HCT: 24.4 % — ABNORMAL LOW (ref 39.0–52.0)
Hemoglobin: 6.3 g/dL — CL (ref 13.0–17.0)
MCH: 16.8 pg — ABNORMAL LOW (ref 26.0–34.0)
MCHC: 25.8 g/dL — ABNORMAL LOW (ref 30.0–36.0)
MCV: 65.1 fL — ABNORMAL LOW (ref 80.0–100.0)
Platelets: 539 10*3/uL — ABNORMAL HIGH (ref 150–400)
RBC: 3.75 MIL/uL — ABNORMAL LOW (ref 4.22–5.81)
RDW: 18.7 % — ABNORMAL HIGH (ref 11.5–15.5)
WBC: 11.5 10*3/uL — ABNORMAL HIGH (ref 4.0–10.5)
nRBC: 0 % (ref 0.0–0.2)

## 2019-03-17 LAB — BASIC METABOLIC PANEL
Anion gap: 8 (ref 5–15)
BUN: 18 mg/dL (ref 6–20)
CO2: 24 mmol/L (ref 22–32)
Calcium: 8.2 mg/dL — ABNORMAL LOW (ref 8.9–10.3)
Chloride: 106 mmol/L (ref 98–111)
Creatinine, Ser: 0.53 mg/dL — ABNORMAL LOW (ref 0.61–1.24)
GFR calc Af Amer: >60 mL/min (ref >60)
GFR calc non Af Amer: >60 mL/min (ref >60)
Glucose, Bld: 118 mg/dL — ABNORMAL HIGH (ref 70–99)
Potassium: 4.5 mmol/L (ref 3.5–5.1)
Sodium: 138 mmol/L (ref 135–145)

## 2019-03-17 SURGERY — LAPAROTOMY, EXPLORATORY
Anesthesia: General | Site: Abdomen

## 2019-03-17 MED ORDER — HYDROMORPHONE HCL 1 MG/ML IJ SOLN
INTRAMUSCULAR | Status: AC
  Administered 2019-03-17: 1 mg via INTRAVENOUS
  Filled 2019-03-17: qty 1

## 2019-03-17 MED ORDER — LACTATED RINGERS IV SOLN
INTRAVENOUS | Status: DC
  Administered 2019-03-17 (×2): via INTRAVENOUS

## 2019-03-17 MED ORDER — METHOCARBAMOL 1000 MG/10ML IJ SOLN
500.0000 mg | Freq: Three times a day (TID) | INTRAVENOUS | Status: DC
  Administered 2019-03-17 – 2019-03-24 (×19): 500 mg via INTRAVENOUS
  Filled 2019-03-17: qty 500
  Filled 2019-03-17: qty 5
  Filled 2019-03-17 (×2): qty 500
  Filled 2019-03-17 (×2): qty 5
  Filled 2019-03-17 (×3): qty 500
  Filled 2019-03-17 (×5): qty 5
  Filled 2019-03-17: qty 500
  Filled 2019-03-17: qty 5
  Filled 2019-03-17 (×3): qty 500
  Filled 2019-03-17: qty 5
  Filled 2019-03-17: qty 500
  Filled 2019-03-17 (×2): qty 5
  Filled 2019-03-17 (×3): qty 500
  Filled 2019-03-17 (×2): qty 5
  Filled 2019-03-17 (×2): qty 500

## 2019-03-17 MED ORDER — HYDRALAZINE HCL 20 MG/ML IJ SOLN: 10.0000 mg | INTRAMUSCULAR | Status: DC | PRN

## 2019-03-17 MED ORDER — SUGAMMADEX SODIUM 200 MG/2ML IV SOLN
INTRAVENOUS | Status: DC | PRN
  Administered 2019-03-17: 150 mg via INTRAVENOUS

## 2019-03-17 MED ORDER — ROCURONIUM BROMIDE 50 MG/5ML IV SOSY
PREFILLED_SYRINGE | INTRAVENOUS | Status: DC | PRN
  Administered 2019-03-17: 50 mg via INTRAVENOUS
  Administered 2019-03-17: 10 mg via INTRAVENOUS

## 2019-03-17 MED ORDER — ONDANSETRON 4 MG PO TBDP
4.0000 mg | ORAL_TABLET | Freq: Four times a day (QID) | ORAL | Status: DC | PRN
  Administered 2019-03-23 (×2): 4 mg via ORAL
  Filled 2019-03-17 (×2): qty 1

## 2019-03-17 MED ORDER — FENTANYL CITRATE (PF) 100 MCG/2ML IJ SOLN
25.0000 ug | INTRAMUSCULAR | Status: DC | PRN
  Administered 2019-03-17 (×3): 50 ug via INTRAVENOUS

## 2019-03-17 MED ORDER — PROPOFOL 10 MG/ML IV BOLUS
INTRAVENOUS | Status: AC
  Filled 2019-03-17: qty 20

## 2019-03-17 MED ORDER — SUCCINYLCHOLINE CHLORIDE 200 MG/10ML IV SOSY
PREFILLED_SYRINGE | INTRAVENOUS | Status: AC
  Filled 2019-03-17: qty 10

## 2019-03-17 MED ORDER — LIDOCAINE 2% (20 MG/ML) 5 ML SYRINGE
INTRAMUSCULAR | Status: AC
  Filled 2019-03-17: qty 5

## 2019-03-17 MED ORDER — MIDAZOLAM HCL 2 MG/2ML IJ SOLN
INTRAMUSCULAR | Status: AC
  Filled 2019-03-17: qty 2

## 2019-03-17 MED ORDER — ACETAMINOPHEN 10 MG/ML IV SOLN
INTRAVENOUS | Status: AC
  Administered 2019-03-17: 1000 mg via INTRAVENOUS
  Filled 2019-03-17: qty 100

## 2019-03-17 MED ORDER — SODIUM CHLORIDE 0.9 % IV SOLN: INTRAVENOUS | Status: DC | PRN

## 2019-03-17 MED ORDER — ONDANSETRON HCL 4 MG/2ML IJ SOLN
INTRAMUSCULAR | Status: AC
  Filled 2019-03-17: qty 2

## 2019-03-17 MED ORDER — HYDROMORPHONE HCL 1 MG/ML IJ SOLN
0.5000 mg | INTRAMUSCULAR | Status: DC | PRN
  Administered 2019-03-17 – 2019-03-20 (×10): 1 mg via INTRAVENOUS
  Administered 2019-03-21: 2 mg via INTRAVENOUS
  Administered 2019-03-21 – 2019-03-22 (×5): 1 mg via INTRAVENOUS
  Administered 2019-03-22: 2 mg via INTRAVENOUS
  Administered 2019-03-22 – 2019-03-23 (×3): 1 mg via INTRAVENOUS
  Filled 2019-03-17 (×2): qty 1
  Filled 2019-03-17: qty 2
  Filled 2019-03-17 (×18): qty 1

## 2019-03-17 MED ORDER — ACETAMINOPHEN 10 MG/ML IV SOLN
1000.0000 mg | Freq: Four times a day (QID) | INTRAVENOUS | Status: AC
  Administered 2019-03-17 – 2019-03-18 (×3): 1000 mg via INTRAVENOUS
  Filled 2019-03-17 (×3): qty 100

## 2019-03-17 MED ORDER — DEXAMETHASONE SODIUM PHOSPHATE 10 MG/ML IJ SOLN
INTRAMUSCULAR | Status: DC | PRN
  Administered 2019-03-17: 10 mg via INTRAVENOUS

## 2019-03-17 MED ORDER — FENTANYL CITRATE (PF) 100 MCG/2ML IJ SOLN
INTRAMUSCULAR | Status: DC | PRN
  Administered 2019-03-17: 100 ug via INTRAVENOUS
  Administered 2019-03-17 (×3): 50 ug via INTRAVENOUS

## 2019-03-17 MED ORDER — PHENYLEPHRINE HCL (PRESSORS) 10 MG/ML IV SOLN
INTRAVENOUS | Status: AC
  Filled 2019-03-17: qty 1

## 2019-03-17 MED ORDER — LACTATED RINGERS IV BOLUS
1000.0000 mL | Freq: Three times a day (TID) | INTRAVENOUS | Status: DC | PRN
  Administered 2019-03-17: 19:00:00 1000 mL via INTRAVENOUS

## 2019-03-17 MED ORDER — PROPOFOL 10 MG/ML IV BOLUS
INTRAVENOUS | Status: DC | PRN
  Administered 2019-03-17: 130 mg via INTRAVENOUS

## 2019-03-17 MED ORDER — ENOXAPARIN SODIUM 40 MG/0.4ML ~~LOC~~ SOLN
40.0000 mg | SUBCUTANEOUS | Status: DC
  Administered 2019-03-18 – 2019-03-26 (×9): 40 mg via SUBCUTANEOUS
  Filled 2019-03-17 (×9): qty 0.4

## 2019-03-17 MED ORDER — ONDANSETRON HCL 4 MG/2ML IJ SOLN
INTRAMUSCULAR | Status: DC | PRN
  Administered 2019-03-17: 4 mg via INTRAVENOUS

## 2019-03-17 MED ORDER — LIDOCAINE 2% (20 MG/ML) 5 ML SYRINGE
INTRAMUSCULAR | Status: DC | PRN
  Administered 2019-03-17: 80 mg via INTRAVENOUS

## 2019-03-17 MED ORDER — SCOPOLAMINE 1 MG/3DAYS TD PT72
1.0000 | MEDICATED_PATCH | TRANSDERMAL | Status: DC
  Administered 2019-03-17: 1.5 mg via TRANSDERMAL

## 2019-03-17 MED ORDER — SUCCINYLCHOLINE CHLORIDE 200 MG/10ML IV SOSY
PREFILLED_SYRINGE | INTRAVENOUS | Status: DC | PRN
  Administered 2019-03-17: 100 mg via INTRAVENOUS

## 2019-03-17 MED ORDER — ALBUMIN HUMAN 5 % IV SOLN
INTRAVENOUS | Status: AC
  Filled 2019-03-17: qty 250

## 2019-03-17 MED ORDER — ROCURONIUM BROMIDE 10 MG/ML (PF) SYRINGE
PREFILLED_SYRINGE | INTRAVENOUS | Status: AC
  Filled 2019-03-17: qty 10

## 2019-03-17 MED ORDER — DIPHENHYDRAMINE HCL 50 MG/ML IJ SOLN
INTRAMUSCULAR | Status: AC
  Filled 2019-03-17: qty 1

## 2019-03-17 MED ORDER — FENTANYL CITRATE (PF) 100 MCG/2ML IJ SOLN
INTRAMUSCULAR | Status: AC
  Administered 2019-03-17: 50 ug via INTRAVENOUS
  Filled 2019-03-17: qty 2

## 2019-03-17 MED ORDER — 0.9 % SODIUM CHLORIDE (POUR BTL) OPTIME
TOPICAL | Status: DC | PRN
  Administered 2019-03-17: 10:00:00 2000 mL

## 2019-03-17 MED ORDER — ALBUMIN HUMAN 5 % IV SOLN
INTRAVENOUS | Status: DC | PRN
  Administered 2019-03-17: 10:00:00 via INTRAVENOUS

## 2019-03-17 MED ORDER — SCOPOLAMINE 1 MG/3DAYS TD PT72
MEDICATED_PATCH | TRANSDERMAL | Status: AC
  Filled 2019-03-17: qty 1

## 2019-03-17 MED ORDER — PROMETHAZINE HCL 25 MG/ML IJ SOLN: 6.2500 mg | INTRAMUSCULAR | Status: DC | PRN

## 2019-03-17 MED ORDER — DIPHENHYDRAMINE HCL 50 MG/ML IJ SOLN
INTRAMUSCULAR | Status: DC | PRN
  Administered 2019-03-17: 12.5 mg via INTRAVENOUS

## 2019-03-17 MED ORDER — METHOCARBAMOL 1000 MG/10ML IJ SOLN
500.0000 mg | Freq: Four times a day (QID) | INTRAVENOUS | Status: DC | PRN
  Filled 2019-03-17: qty 5

## 2019-03-17 MED ORDER — ONDANSETRON HCL 4 MG/2ML IJ SOLN
4.0000 mg | Freq: Four times a day (QID) | INTRAMUSCULAR | Status: DC | PRN
  Administered 2019-03-17: 05:00:00 4 mg via INTRAVENOUS
  Filled 2019-03-17: qty 2

## 2019-03-17 MED ORDER — FENTANYL CITRATE (PF) 100 MCG/2ML IJ SOLN
INTRAMUSCULAR | Status: AC
  Filled 2019-03-17: qty 2

## 2019-03-17 MED ORDER — SODIUM CHLORIDE 0.9 % IV SOLN
INTRAVENOUS | Status: DC | PRN
  Administered 2019-03-17: 40 ug/min via INTRAVENOUS

## 2019-03-17 MED ORDER — HYDROMORPHONE HCL 1 MG/ML IJ SOLN
0.2500 mg | INTRAMUSCULAR | Status: DC | PRN
  Administered 2019-03-17: 0.25 mg via INTRAVENOUS

## 2019-03-17 MED ORDER — SODIUM CHLORIDE 0.9 % IV SOLN
INTRAVENOUS | Status: DC
  Administered 2019-03-17 – 2019-03-23 (×11): via INTRAVENOUS

## 2019-03-17 MED ORDER — FENTANYL CITRATE (PF) 250 MCG/5ML IJ SOLN
INTRAMUSCULAR | Status: AC
  Filled 2019-03-17: qty 5

## 2019-03-17 MED ORDER — ONDANSETRON HCL 4 MG/2ML IJ SOLN
4.0000 mg | Freq: Four times a day (QID) | INTRAMUSCULAR | Status: DC | PRN
  Administered 2019-03-17 – 2019-03-24 (×3): 4 mg via INTRAVENOUS
  Filled 2019-03-17 (×3): qty 2

## 2019-03-17 MED ORDER — SODIUM CHLORIDE 0.9% IV SOLUTION
Freq: Once | INTRAVENOUS | Status: AC
  Administered 2019-03-17: 06:00:00 via INTRAVENOUS

## 2019-03-17 MED ORDER — SUGAMMADEX SODIUM 200 MG/2ML IV SOLN
INTRAVENOUS | Status: AC
  Filled 2019-03-17: qty 2

## 2019-03-17 SURGICAL SUPPLY — 51 items
APL PRP STRL LF DISP 70% ISPRP (MISCELLANEOUS) ×1
BLADE EXTENDED COATED 6.5IN (ELECTRODE) IMPLANT
BLADE HEX COATED 2.75 (ELECTRODE) ×2 IMPLANT
CHLORAPREP W/TINT 26 (MISCELLANEOUS) ×3 IMPLANT
COUNTER NEEDLE 20 DBL MAG RED (NEEDLE) ×4 IMPLANT
COVER MAYO STAND STRL (DRAPES) ×7 IMPLANT
COVER SURGICAL LIGHT HANDLE (MISCELLANEOUS) ×3 IMPLANT
COVER WAND RF STERILE (DRAPES) IMPLANT
DRAPE LAPAROSCOPIC ABDOMINAL (DRAPES) ×3 IMPLANT
DRAPE SHEET LG 3/4 BI-LAMINATE (DRAPES) ×8 IMPLANT
DRAPE WARM FLUID 44X44 (DRAPE) ×2 IMPLANT
DRSG OPSITE POSTOP 4X10 (GAUZE/BANDAGES/DRESSINGS) ×2 IMPLANT
DRSG OPSITE POSTOP 4X6 (GAUZE/BANDAGES/DRESSINGS) IMPLANT
DRSG OPSITE POSTOP 4X8 (GAUZE/BANDAGES/DRESSINGS) IMPLANT
ELECT PENCIL ROCKER SW 15FT (MISCELLANEOUS) ×2 IMPLANT
ELECT REM PT RETURN 15FT ADLT (MISCELLANEOUS) ×3 IMPLANT
GAUZE SPONGE 4X4 12PLY STRL (GAUZE/BANDAGES/DRESSINGS) IMPLANT
GLOVE BIO SURGEON STRL SZ7 (GLOVE) ×6 IMPLANT
GLOVE BIOGEL PI IND STRL 7.5 (GLOVE) ×2 IMPLANT
GLOVE BIOGEL PI INDICATOR 7.5 (GLOVE) ×4
GOWN STRL REUS W/TWL LRG LVL3 (GOWN DISPOSABLE) ×10 IMPLANT
GOWN STRL REUS W/TWL XL LVL3 (GOWN DISPOSABLE) ×10 IMPLANT
HANDLE SUCTION POOLE (INSTRUMENTS) IMPLANT
KIT BASIN OR (CUSTOM PROCEDURE TRAY) ×2 IMPLANT
KIT TURNOVER KIT A (KITS) IMPLANT
LIGASURE IMPACT 36 18CM CVD LR (INSTRUMENTS) ×2 IMPLANT
PACK COLON (CUSTOM PROCEDURE TRAY) ×1 IMPLANT
PACK GENERAL/GYN (CUSTOM PROCEDURE TRAY) ×2 IMPLANT
PENCIL HANDSWITCHING (ELECTRODE) ×2 IMPLANT
RELOAD PROXIMATE 75MM BLUE (ENDOMECHANICALS) ×6 IMPLANT
RELOAD STAPLE 75 3.8 BLU REG (ENDOMECHANICALS) IMPLANT
SPONGE LAP 18X18 RF (DISPOSABLE) ×2 IMPLANT
STAPLER PROXIMATE 75MM BLUE (STAPLE) ×2 IMPLANT
STAPLER VISISTAT 35W (STAPLE) ×1 IMPLANT
SUCTION POOLE HANDLE (INSTRUMENTS) ×3
SUT NOVA NAB GS-21 0 18 T12 DT (SUTURE) IMPLANT
SUT NOVA NAB GS-21 1 T12 (SUTURE) IMPLANT
SUT PDS AB 1 CTX 36 (SUTURE) ×4 IMPLANT
SUT PDS AB 1 TP1 96 (SUTURE) IMPLANT
SUT SILK 2 0 (SUTURE) ×3
SUT SILK 2 0 SH CR/8 (SUTURE) ×3 IMPLANT
SUT SILK 2-0 18XBRD TIE 12 (SUTURE) ×1 IMPLANT
SUT SILK 3 0 (SUTURE) ×3
SUT SILK 3 0 SH CR/8 (SUTURE) ×3 IMPLANT
SUT SILK 3-0 18XBRD TIE 12 (SUTURE) ×1 IMPLANT
TOWEL OR 17X26 10 PK STRL BLUE (TOWEL DISPOSABLE) ×2 IMPLANT
TOWEL OR NON WOVEN STRL DISP B (DISPOSABLE) ×5 IMPLANT
TRAY FOLEY MTR SLVR 14FR STAT (SET/KITS/TRAYS/PACK) IMPLANT
TRAY FOLEY MTR SLVR 16FR STAT (SET/KITS/TRAYS/PACK) ×2 IMPLANT
YANKAUER SUCT BULB TIP 10FT TU (MISCELLANEOUS) ×2 IMPLANT
YANKAUER SUCT BULB TIP NO VENT (SUCTIONS) ×2 IMPLANT

## 2019-03-17 NOTE — Progress Notes (Signed)
Subjective/Chief Complaint: Still no flatus or stool, nauseated   Objective: Vital signs in last 24 hours: Temp:  [97.6 F (36.4 C)-98.6 F (37 C)] 97.6 F (36.4 C) (04/16 0648) Pulse Rate:  [93-121] 96 (04/16 0648) Resp:  [14-26] 16 (04/16 0648) BP: (118-149)/(77-99) 134/77 (04/16 0648) SpO2:  [93 %-100 %] 100 % (04/16 0648) Weight:  [75.2 kg-81.6 kg] 75.2 kg (04/15 2155) Last BM Date: 03/16/19  Intake/Output from previous day: 04/15 0701 - 04/16 0700 In: 2182.8 [I.V.:1132.8; IV Piggyback:1050] Out: 950 [Urine:550; Emesis/NG output:400] Intake/Output this shift: No intake/output data recorded.  GI: distended mildly tender throughout, no bs  Lab Results:  Recent Labs    03/16/19 1512 03/17/19 0355  WBC 13.0* 11.5*  HGB 8.1* 6.3*  HCT 31.3* 24.4*  PLT 679* 539*   BMET Recent Labs    03/16/19 1512 03/17/19 0355  NA 134* 138  K 4.3 4.5  CL 94* 106  CO2 25 24  GLUCOSE 151* 118*  BUN 24* 18  CREATININE 0.77 0.53*  CALCIUM 8.9 8.2*   PT/INR Recent Labs    03/16/19 2214  LABPROT 13.2  INR 1.0   ABG No results for input(s): PHART, HCO3 in the last 72 hours.  Invalid input(s): PCO2, PO2  Studies/Results: Ct Angio Chest Pe W And/or Wo Contrast  Result Date: 03/16/2019 CLINICAL DATA:  Weakness with lower abdominal pain 1 day. Diagnosed with lung cancer last year. EXAM: CT ANGIOGRAPHY CHEST CT ABDOMEN AND PELVIS WITH CONTRAST TECHNIQUE: Multidetector CT imaging of the chest was performed using the standard protocol during bolus administration of intravenous contrast. Multiplanar CT image reconstructions and MIPs were obtained to evaluate the vascular anatomy. Multidetector CT imaging of the abdomen and pelvis was performed using the standard protocol during bolus administration of intravenous contrast. CONTRAST:  1101mL OMNIPAQUE IOHEXOL 350 MG/ML SOLN COMPARISON:  Chest CT 08/30/2018 and abdominal CT 06/30/2016 FINDINGS: CTA CHEST FINDINGS Cardiovascular:  Heart is normal size. Thoracic aorta is normal in caliber with minimal calcified plaque present. Pulmonary arterial system is within normal. Persistent obstruction of the right upper lobar arteries. Remaining vascular structures are unremarkable. Mediastinum/Nodes: Significant decrease in size in patient's previously seen anterior mediastinal mass over the right side of the anterior mediastinum with mild residual density in this region extending down to the right hilum. This residual mass measures approximately 2.9 x 3.9 cm in transverse in AP dimension (previously 13.1 x 10.5 cm). The right hilar component of this mass measures 2.2 cm in AP dimension (previously 5.7 cm). 1.4 cm left infrahilar node. New 2.4 cm soft tissue nodule abutting the right heart border and new 2 cm soft tissue nodule abutting the left anterolateral heart border just above the level of the ventricular septum. Lungs/Pleura: Lungs are adequately inflated and demonstrate resolution of several small right-sided pulmonary nodules. There is new opacification with bronchiectatic change over the medial right lung with areas of nodularity likely post radiation change. Stable nodule over the medial left upper lobe measuring 12 x 15 mm. Increase in size of a nodule over the posterior left lower lobe measuring 13 mm. Minimal emphysematous disease. No evidence of pleural effusion. Musculoskeletal: Mild degenerative change of the spine. Review of the MIP images confirms the above findings. CT ABDOMEN and PELVIS FINDINGS Hepatobiliary: 2.3 cm hypodense mass over the posterior segment right lobe of the liver as well as 1.3 cm hypodense mass over the anterior right lobe of the liver likely representing metastatic disease. Subcentimeter hypodensity over the right lobe  which may represent a small cyst versus metastatic disease. Previous cholecystectomy. Biliary tree is unremarkable. Pancreas: Normal Spleen: Normal. Adrenals/Urinary Tract: New 1.8 cm left  adrenal nodule likely metastatic focus. Right adrenal gland is normal. Kidneys are normal in size without hydronephrosis or nephrolithiasis. There are a couple subcentimeter right renal cortical hypodensities too small to characterize but likely cysts. Ureters and bladder are normal. Stomach/Bowel: Stomach is within normal. There are multiple fluid and air-filled dilated small bowel loops measuring up to 5.4 cm in diameter. There is an abrupt transition 0.8 in the midline abdomen at the level of the umbilicus with there is an ileo ileal intussusception possibly due to a small lead point mass. There is a second ileocolic intussusception in the right lower quadrant likely due to additional bowel masses. This ileocolic intussusception extends to the hepatic flexure. The colon is decompressed distal to the hepatic flexure. Vascular/Lymphatic: Moderate calcified plaque over the abdominal aorta. Portacaval lymph node measuring 2.2 cm in diameter with adjacent celiac axis node measuring 1.8 cm in diameter. Reproductive: Normal. Other: No significant free fluid or free peritoneal air. Musculoskeletal: Degenerative change of the spine and hips. Review of the MIP images confirms the above findings. IMPRESSION: 1. Small bowel obstruction with abrupt transition point in the midline abdomen at the level of the umbilicus due to ileo ileal intussusception likely secondary to small bowel mass which is likely metastatic disease. There is a second intussusception in the right lower quadrant as there is an ileocolic intussusception extending to the hepatic flexure likely due to multiple bowel masses. 2. Significant decrease in size of patient's anterior mediastinal mass as the residual mass measures approximately 2.9 x 3.9 cm (previously 13.1 x 10.5 cm). Decreased size of right hilar component of this mass. 1.4 cm low left infrahilar lymph node. New metastatic foci abutting the right and left heart border as described. Resolution of  several small right pulmonary nodules with stable and increased size of left pulmonary nodules as described. 3. 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 4. Two new hypodense liver masses with the larger measuring 2.3 cm over the right lobe likely metastatic disease. New 1.8 cm left adrenal nodule likely metastatic disease. Worsening adenopathy in the region of the celiac axis and portacaval region likely metastatic disease. 5. A few subcentimeter right renal cortical hypodensities too small to characterize but likely cysts. 6. Aortic Atherosclerosis (ICD10-I70.0) and Emphysema (ICD10-J43.9). These results were called by telephone at the time of interpretation on 03/16/2019 at 5:15 pm to PA, [facility] , who verbally acknowledged these results. Electronically Signed   By: Marin Olp M.D.   On: 03/16/2019 17:16   Ct Abdomen Pelvis W Contrast  Result Date: 03/16/2019 CLINICAL DATA:  Weakness with lower abdominal pain 1 day. Diagnosed with lung cancer last year. EXAM: CT ANGIOGRAPHY CHEST CT ABDOMEN AND PELVIS WITH CONTRAST TECHNIQUE: Multidetector CT imaging of the chest was performed using the standard protocol during bolus administration of intravenous contrast. Multiplanar CT image reconstructions and MIPs were obtained to evaluate the vascular anatomy. Multidetector CT imaging of the abdomen and pelvis was performed using the standard protocol during bolus administration of intravenous contrast. CONTRAST:  180mL OMNIPAQUE IOHEXOL 350 MG/ML SOLN COMPARISON:  Chest CT 08/30/2018 and abdominal CT 06/30/2016 FINDINGS: CTA CHEST FINDINGS Cardiovascular: Heart is normal size. Thoracic aorta is normal in caliber with minimal calcified plaque present. Pulmonary arterial system is within normal. Persistent obstruction of the right upper lobar  arteries. Remaining vascular structures are unremarkable. Mediastinum/Nodes: Significant decrease in size  in patient's previously seen anterior mediastinal mass over the right side of the anterior mediastinum with mild residual density in this region extending down to the right hilum. This residual mass measures approximately 2.9 x 3.9 cm in transverse in AP dimension (previously 13.1 x 10.5 cm). The right hilar component of this mass measures 2.2 cm in AP dimension (previously 5.7 cm). 1.4 cm left infrahilar node. New 2.4 cm soft tissue nodule abutting the right heart border and new 2 cm soft tissue nodule abutting the left anterolateral heart border just above the level of the ventricular septum. Lungs/Pleura: Lungs are adequately inflated and demonstrate resolution of several small right-sided pulmonary nodules. There is new opacification with bronchiectatic change over the medial right lung with areas of nodularity likely post radiation change. Stable nodule over the medial left upper lobe measuring 12 x 15 mm. Increase in size of a nodule over the posterior left lower lobe measuring 13 mm. Minimal emphysematous disease. No evidence of pleural effusion. Musculoskeletal: Mild degenerative change of the spine. Review of the MIP images confirms the above findings. CT ABDOMEN and PELVIS FINDINGS Hepatobiliary: 2.3 cm hypodense mass over the posterior segment right lobe of the liver as well as 1.3 cm hypodense mass over the anterior right lobe of the liver likely representing metastatic disease. Subcentimeter hypodensity over the right lobe which may represent a small cyst versus metastatic disease. Previous cholecystectomy. Biliary tree is unremarkable. Pancreas: Normal Spleen: Normal. Adrenals/Urinary Tract: New 1.8 cm left adrenal nodule likely metastatic focus. Right adrenal gland is normal. Kidneys are normal in size without hydronephrosis or nephrolithiasis. There are a couple subcentimeter right renal cortical hypodensities too small to characterize but likely cysts. Ureters and bladder are normal. Stomach/Bowel:  Stomach is within normal. There are multiple fluid and air-filled dilated small bowel loops measuring up to 5.4 cm in diameter. There is an abrupt transition 0.8 in the midline abdomen at the level of the umbilicus with there is an ileo ileal intussusception possibly due to a small lead point mass. There is a second ileocolic intussusception in the right lower quadrant likely due to additional bowel masses. This ileocolic intussusception extends to the hepatic flexure. The colon is decompressed distal to the hepatic flexure. Vascular/Lymphatic: Moderate calcified plaque over the abdominal aorta. Portacaval lymph node measuring 2.2 cm in diameter with adjacent celiac axis node measuring 1.8 cm in diameter. Reproductive: Normal. Other: No significant free fluid or free peritoneal air. Musculoskeletal: Degenerative change of the spine and hips. Review of the MIP images confirms the above findings. IMPRESSION: 1. Small bowel obstruction with abrupt transition point in the midline abdomen at the level of the umbilicus due to ileo ileal intussusception likely secondary to small bowel mass which is likely metastatic disease. There is a second intussusception in the right lower quadrant as there is an ileocolic intussusception extending to the hepatic flexure likely due to multiple bowel masses. 2. Significant decrease in size of patient's anterior mediastinal mass as the residual mass measures approximately 2.9 x 3.9 cm (previously 13.1 x 10.5 cm). Decreased size of right hilar component of this mass. 1.4 cm low left infrahilar lymph node. New metastatic foci abutting the right and left heart border as described. Resolution of several small right pulmonary nodules with stable and increased size of left pulmonary nodules as described. 3. 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 4. Two new  hypodense liver masses with the larger measuring 2.3 cm over the  right lobe likely metastatic disease. New 1.8 cm left adrenal nodule likely metastatic disease. Worsening adenopathy in the region of the celiac axis and portacaval region likely metastatic disease. 5. A few subcentimeter right renal cortical hypodensities too small to characterize but likely cysts. 6. Aortic Atherosclerosis (ICD10-I70.0) and Emphysema (ICD10-J43.9). These results were called by telephone at the time of interpretation on 03/16/2019 at 5:15 pm to PA, [facility] , who verbally acknowledged these results. Electronically Signed   By: Marin Olp M.D.   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  Although not getting any active melanoma treatment prior to these two weeks he was doing well. He would like to pursue treatment to see if we can make him better now. I discussed with him a laparotomy with bowel resection, possible stoma, placement of gastrostomy tube.  I also discussed I may realize that there is widespread disease and only place a gastrostomy tube.  He understands this.  He understands risks to be but not limited to bleeding, infection, reoperation, leak of anastomosis, long term ileus, mi and other cardiac complications, pna, dvt/pe, stroke, prolonged mechanical ventilation and death.  He would like to proceed with being a full code.   -He asked me to call his son Korin at [phone] who he said could update his wife.  I spoke with  Nahshon over the phone and explained all this to him and he concurred  Rolm Bookbinder 03/17/2019

## 2019-03-17 NOTE — Anesthesia Preprocedure Evaluation (Signed)
Anesthesia Evaluation  Patient identified by MRN, date of birth, ID band Patient awake    Reviewed: Allergy & Precautions, H&P , NPO status , Patient's Chart, lab work & pertinent test results  History of Anesthesia Complications Negative for: history of anesthetic complications  Airway Mallampati: II  TM Distance: >3 FB Neck ROM: Full    Dental no notable dental hx. (+) Edentulous Upper, Dental Advisory Given   Pulmonary former smoker,  Large mediastinal/RUL mass   + rhonchi        Cardiovascular hypertension,  Rhythm:Regular Rate:Tachycardia     Neuro/Psych CVA, No Residual Symptoms negative psych ROS   GI/Hepatic Neg liver ROS,   Endo/Other  negative endocrine ROS  Renal/GU negative Renal ROS  negative genitourinary   Musculoskeletal   Abdominal   Peds  Hematology negative hematology ROS (+) anemia ,   Anesthesia Other Findings   Reproductive/Obstetrics negative OB ROS                             Anesthesia Physical  Anesthesia Plan  ASA: IV and emergent  Anesthesia Plan: General   Post-op Pain Management:    Induction: Intravenous  PONV Risk Score and Plan: 4 or greater and Ondansetron, Dexamethasone, Scopolamine patch - Pre-op and Diphenhydramine  Airway Management Planned: Oral ETT  Additional Equipment:   Intra-op Plan:   Post-operative Plan: Possible Post-op intubation/ventilation  Informed Consent: I have reviewed the patients History and Physical, chart, labs and discussed the procedure including the risks, benefits and alternatives for the proposed anesthesia with the patient or authorized representative who has indicated his/her understanding and acceptance.     Dental advisory given  Plan Discussed with: CRNA and Anesthesiologist  Anesthesia Plan Comments:         Anesthesia Quick Evaluation

## 2019-03-17 NOTE — Anesthesia Postprocedure Evaluation (Signed)
Anesthesia Post Note  Patient: Gerald Adkins  Procedure(s) Performed: EXPLORATORY LAPAROTOMY SMALL BOWEL AND RIGHT COLON RESECTION (N/A Abdomen)     Patient location during evaluation: PACU Anesthesia Type: General Level of consciousness: sedated Pain management: pain level controlled Vital Signs Assessment: post-procedure vital signs reviewed and stable Respiratory status: spontaneous breathing and respiratory function stable Cardiovascular status: stable Postop Assessment: no apparent nausea or vomiting Anesthetic complications: no    Last Vitals:  Vitals:   03/17/19 1200 03/17/19 1215  BP: (!) 140/93   Pulse:  (!) 116  Resp: 14   Temp:  36.9 C  SpO2: 100% 100%                  Ahmet Schank DANIEL

## 2019-03-17 NOTE — Transfer of Care (Signed)
Immediate Anesthesia Transfer of Care Note  Patient: Gerald Adkins  Procedure(s) Performed: EXPLORATORY LAPAROTOMY SMALL BOWEL AND RIGHT COLON RESECTION (N/A Abdomen)  Patient Location: PACU  Anesthesia Type:General  Level of Consciousness: drowsy and patient cooperative  Airway & Oxygen Therapy: Patient Spontanous Breathing and Patient connected to face mask oxygen  Post-op Assessment: Report given to RN and Post -op Vital signs reviewed and stable  Post vital signs: Reviewed and stable  Last Vitals:  Vitals Value Taken Time  BP 146/81 03/17/2019 11:15 AM  Temp    Pulse    Resp 18 03/17/2019 11:22 AM  SpO2    Vitals shown include unvalidated device data.  Last Pain:  Vitals:   03/17/19 0919  TempSrc:   PainSc: 0-No pain      Patients Stated Pain Goal: 4 (42/35/36 1443)  Complications: No apparent anesthesia complications

## 2019-03-17 NOTE — Progress Notes (Signed)
Pt pain rating 9/10. Pain med given.  Will reassess.

## 2019-03-17 NOTE — Anesthesia Procedure Notes (Signed)
Procedure Name: Intubation Date/Time: 03/17/2019 9:50 AM Performed by: Montel Clock, CRNA Pre-anesthesia Checklist: Patient identified, Emergency Drugs available, Suction available, Patient being monitored and Timeout performed Patient Re-evaluated:Patient Re-evaluated prior to induction Oxygen Delivery Method: Circle system utilized Preoxygenation: Pre-oxygenation with 100% oxygen Induction Type: IV induction and Rapid sequence Laryngoscope Size: Mac and 3 Grade View: Grade I Tube type: Oral Tube size: 7.5 mm Number of attempts: 1 Airway Equipment and Method: Stylet Placement Confirmation: ETT inserted through vocal cords under direct vision,  positive ETCO2 and breath sounds checked- equal and bilateral Secured at: 23 cm Tube secured with: Tape Dental Injury: Teeth and Oropharynx as per pre-operative assessment

## 2019-03-17 NOTE — Progress Notes (Signed)
Text to Dr Ninfa Linden.  Received return Phone call with new order that NG tube may be left out.  The first two attempts at insertion resulted in the tube coming out his mouth.  The second RN was not able to insert tube either.  The patient then declined the tube stating, "Your going to have to knock me out".  The patient declined nausea and has not vomited.  He returned to a restful state thereafter.  Expressive of his needs.

## 2019-03-17 NOTE — Op Note (Addendum)
Preoperative diagnosis: History of metastatic melanoma, small bowel obstruction x2 with what appears to be intussusception secondary to metastatic melanoma Postoperative diagnosis: Same as above Procedure: Right hemicolectomy, small bowel resection Surgeon: Dr. Serita Grammes Assistant: Dr. Nedra Hai Anesthesia: General Estimated blood loss: 50 cc Specimens: Small bowel and right colon to pathology Complications: None Drains: None Sponge and needle count correct at completion Disposition to recovery in stable condition  Indications: This is a 63 year old male with known metastatic melanoma who has been under observation due to intolerance of therapy.  He was admitted overnight with what appeared to be 2 segments of intussusception with resultant small bowel obstruction secondary to metastatic melanoma.  I saw him this morning we discussed all of her options.  We elected to go to the operating room for exploration as this was not better.  Procedure: After informed consent was obtained from the patient and after I discussed it with his son via the telephone we proceeded in the operating room.  He was given antibiotics.  SCDs were in place.  He was then placed under general anesthesia without complication.  A nasogastric tube and a Foley catheter were placed.  He was prepped and draped in the standard sterile surgical fashion.  A surgical timeout was then performed.  I made a midline incision and entered into his abdomen.  He had some fluid present.  There was no perforation.  I was unable to explore his entire peritoneum.  He had some small nodules on his liver.  I ran his entire small bowel which was very dilated.  There was a small bowel to small bowel intussusception in the ileum as well as an intussusception of the terminal ileum that was separate from this into the right colon.  I reduced these.  The bowel was all viable.  It appeared to have metastatic deposits on the bowel that with a lead  point for the intussusception.  There was no evidence of any disease that I could identify in the remainder of his bowel.  There was no evidence of disease in the stomach.  The nasogastric tube is in place.  I elected to divide the small bowel just proximal to the metastatic deposit.  I then divided the transverse colon.  I then released the white line of Toldt and rolled the colon medially.  I then was able to use the LigaSure device and a combination of silk sutures to divide the entire mesentery.  This was then passed off the table.  I needed Dr. Trevor Mace assistance with exposure during this portion of the operation.  I then irrigated.  I then brought the small bowel next to the transverse colon.  His albumin was 3 and his bowel was very healthy.  I thought that it anastomosis was the best way to go.  I ended up removing a fair amount of his ileum but I thought that one anastomosis and removing the port that had been intussuscepted would also be a good idea.  I then approximated the use with 3-0 silk suture.  I closed the mesenteric defect with 2-0 silk suture.  I then made enterotomies in both the transverse colon and small bowel.  I used a GIA stapler to create a anastomosis.  This was hemostatic.  I then closed the common enterotomy with a TX stapler.  This was patent and hemostatic.  This was viable.  I placed 2 3-0 apex sutures.  I then irrigated.  I brought the omentum down overlying this.  I then utilized the colon protocol.  I then closed the abdomen with #1 PDS.  The skin was stapled and a dressing was placed.  He tolerated this well was extubated transferred to recovery stable.

## 2019-03-17 NOTE — Progress Notes (Signed)
Notified Dr Johney Maine. Orders given

## 2019-03-18 ENCOUNTER — Inpatient Hospital Stay (HOSPITAL_COMMUNITY): Payer: Medicare Other

## 2019-03-18 ENCOUNTER — Encounter (HOSPITAL_COMMUNITY): Payer: Self-pay | Admitting: General Surgery

## 2019-03-18 LAB — CBC
HCT: 25.7 % — ABNORMAL LOW (ref 39.0–52.0)
Hemoglobin: 6.9 g/dL — CL (ref 13.0–17.0)
MCH: 18.5 pg — ABNORMAL LOW (ref 26.0–34.0)
MCHC: 26.8 g/dL — ABNORMAL LOW (ref 30.0–36.0)
MCV: 68.9 fL — ABNORMAL LOW (ref 80.0–100.0)
Platelets: 472 10*3/uL — ABNORMAL HIGH (ref 150–400)
RBC: 3.73 MIL/uL — ABNORMAL LOW (ref 4.22–5.81)
RDW: 21.2 % — ABNORMAL HIGH (ref 11.5–15.5)
WBC: 8.7 10*3/uL (ref 4.0–10.5)
nRBC: 0 % (ref 0.0–0.2)

## 2019-03-18 LAB — BASIC METABOLIC PANEL
Anion gap: 7 (ref 5–15)
BUN: 17 mg/dL (ref 6–20)
CO2: 24 mmol/L (ref 22–32)
Calcium: 8.2 mg/dL — ABNORMAL LOW (ref 8.9–10.3)
Chloride: 109 mmol/L (ref 98–111)
Creatinine, Ser: 0.68 mg/dL (ref 0.61–1.24)
GFR calc Af Amer: >60 mL/min (ref >60)
GFR calc non Af Amer: >60 mL/min (ref >60)
Glucose, Bld: 123 mg/dL — ABNORMAL HIGH (ref 70–99)
Potassium: 4.8 mmol/L (ref 3.5–5.1)
Sodium: 140 mmol/L (ref 135–145)

## 2019-03-18 LAB — HEMOGLOBIN AND HEMATOCRIT, BLOOD
HCT: 28.8 % — ABNORMAL LOW (ref 39.0–52.0)
Hemoglobin: 7.9 g/dL — ABNORMAL LOW (ref 13.0–17.0)

## 2019-03-18 LAB — PREPARE RBC (CROSSMATCH)

## 2019-03-18 MED ORDER — ACETAMINOPHEN 10 MG/ML IV SOLN
1000.0000 mg | Freq: Four times a day (QID) | INTRAVENOUS | Status: AC
  Administered 2019-03-18 – 2019-03-19 (×4): 1000 mg via INTRAVENOUS
  Filled 2019-03-18 (×4): qty 100

## 2019-03-18 MED ORDER — SODIUM CHLORIDE 0.9% IV SOLUTION
Freq: Once | INTRAVENOUS | Status: AC
  Administered 2019-03-18: 08:00:00 via INTRAVENOUS

## 2019-03-18 NOTE — Evaluation (Signed)
Physical Therapy Evaluation Patient Details Name: Gerald Adkins MRN: [id number] DOB: Oct 29, 1956 Today's Date: 03/18/2019   History of Present Illness  Pt is a 63 year old male s/p small bowel resection/right colectomy secondary to metastatic melanoma on 03/17/19 with hx of CVA, opiate addiction, lumbar spine surgery  Clinical Impression  Pt admitted with above diagnosis. Pt currently with functional limitations due to the deficits listed below (see PT Problem List).  Pt will benefit from skilled PT to increase their independence and safety with mobility to allow discharge to the venue listed below.  Pt premedicated per his request for OOB.  Pt only agreeable to transfer to recliner at this time however due to foley catheter still in place.  Anticipate pt will return close to baseline upon d/c.  Pt reports his wife and son can assist if needed upon d/c home.     Follow Up Recommendations No PT follow up    Equipment Recommendations  None recommended by PT    Recommendations for Other Services       Precautions / Restrictions Precautions Precautions: Fall Precaution Comments: NG tube      Mobility  Bed Mobility Overal bed mobility: Needs Assistance Bed Mobility: Supine to Sit     Supine to sit: HOB elevated;Supervision     General bed mobility comments: supervision for lines  Transfers Overall transfer level: Needs assistance Equipment used: None Transfers: Sit to/from [facility] Sit to Stand: Min guard Stand pivot transfers: Min guard       General transfer comment: min/guard for safety, pt utilized UEs for support, declined RW for transfers, assist for lines   Ambulation/Gait             General Gait Details: pt refused due to foley catheter (wants removed first - Therapist, sports notified)  Stairs            Wheelchair Mobility    Modified Rankin (Stroke Patients Only)       Balance                                              Pertinent Vitals/Pain Pain Assessment: 0-10 Pain Score: 8  Pain Location: surgical site Pain Descriptors / Indicators: Tender;Sore Pain Intervention(s): Limited activity within patient's tolerance;Repositioned;Monitored during session;Premedicated before session    [place] expects to be discharged to:: Private residence Living Arrangements: Spouse/significant other Available Help at Discharge: Family Type of Home: House       Home Layout: One level Home Equipment: [place] - single point      Prior Function Level of Independence: Independent         Comments: reports walking one mile every day prior to admission     Hand Dominance        Extremity/Trunk Assessment        Lower Extremity Assessment Lower Extremity Assessment: Overall WFL for tasks assessed       Communication   Communication: No difficulties  Cognition Arousal/Alertness: Awake/alert Behavior During Therapy: WFL for tasks assessed/performed Overall Cognitive Status: Within Functional Limits for tasks assessed                                        General Comments      Exercises  Assessment/Plan    PT Assessment Patient needs continued PT services  PT Problem List Decreased mobility;Decreased activity tolerance;Decreased knowledge of use of DME       PT Treatment Interventions DME instruction;Functional mobility training;Balance training;Patient/family education;Gait training;Therapeutic activities;Stair training;Therapeutic exercise    PT Goals (Current goals can be found in the Care Plan section)  Acute Rehab PT Goals PT Goal Formulation: With patient Time For Goal Achievement: 04/01/19 Potential to Achieve Goals: Good    Frequency Min 3X/week   Barriers to discharge        Co-evaluation               AM-PAC PT "6 Clicks" Mobility  Outcome Measure Help needed turning from your back to your side while in a flat bed without  using bedrails?: A Little Help needed moving from lying on your back to sitting on the side of a flat bed without using bedrails?: A Little Help needed moving to and from a bed to a chair (including a wheelchair)?: A Little Help needed standing up from a chair using your arms (e.g., wheelchair or bedside chair)?: A Little Help needed to walk in hospital room?: A Little Help needed climbing 3-5 steps with a railing? : A Little 6 Click Score: 18    End of Session Equipment Utilized During Treatment: Oxygen Activity Tolerance: Patient tolerated treatment well Patient left: in chair;with call bell/phone within reach;with chair alarm set Nurse Communication: Mobility status PT Visit Diagnosis: Difficulty in walking, not elsewhere classified (R26.2)    Time: 7530-0511 PT Time Calculation (min) (ACUTE ONLY): 18 min   Charges:   PT Evaluation $PT Eval Low Complexity: [place], PT, DPT Acute Rehabilitation Services Office: [phone] Pager: [phone]   Trena Platt 03/18/2019, 3:16 PM

## 2019-03-18 NOTE — Progress Notes (Addendum)
RN called to room by NT, NGT partially dislodged while transferring patient from chair to bed, NGT reinserted, positioning verified by ascultation, ale to aspirated gastric fluid, DG ABD xray ordered to confirm placement. No change in breath sounds, lungs clear and diminished in bases, oxygen saturation 100% on 1 lnc

## 2019-03-18 NOTE — Progress Notes (Signed)
Initial Nutrition Assessment  RD working remotely.   DOCUMENTATION CODES:   (unable to assess for malnutrition at this time)  INTERVENTION:  - diet advancement as medically feasible. - if unable to advance diet in the next 3-5 days, may need to consider nutrition support (NGT currently in for LIS, no PICC/central line/port at this time).   NUTRITION DIAGNOSIS:   Increased nutrient needs related to acute illness, chronic illness, cancer and cancer related treatments as evidenced by estimated needs.  GOAL:   Patient will meet greater than or equal to 90% of their needs  MONITOR:   Diet advancement, Labs, Weight trends, Skin, I & O's  REASON FOR ASSESSMENT:   Malnutrition Screening Tool  ASSESSMENT:   63 year old male with known metastatic melanoma who presented to the ED with weakness, intermittent sharp abdominal pain, episodes of bloody diarrhea, and intermittent N/V for 2 weeks. CT scan of the chest, abdomen, and pelvis showed an area in small bowel as well as in the distal ileum and descending colon there was intussuscepted. He reported that pain has been intermittent and has not changed much in the past 2 weeks. Previously, he received radiation to metastatic disease in his mediastinum and did receive 1 round of chemotherapy toward the end of last year (2019)which he did not tolerate.   BMI indicates normal weight. Patient has been NPO since admission. He is POD #1 small bowel resection and R colectomy. NGT in place and Dr. Cristal Generous note from this AM states anticipate it will be in "for some time" d/t post-op ileus. Review of RN flow sheet indicates 250 ml output this shift (as of 9:45 AM).   Patient was last seen by a RD during hospitalization in October. Patient was dx with melanoma that month. Patient with ongoing abdominal pain/soreness. No vomiting since admission and nausea has resolved with NGT placement.   Per chart review, weights are as follows: Current- 165  lb 10/11/18- 169 lb 09/22/18- 175 lb 08/23/18- 180 lb This indicates 10 lb weight loss (5.7% body weight) in the past 6 months; not significant for time frame. Patient reports UBW of 180 lb feels that he last weighed this around the time of cancer dx (this is confirmed by weights in chart).     Medications reviewed. Labs reviewed; Ca: 8.2 mg/dl.  IVF; NS @ 100 ml/hr.     NUTRITION - FOCUSED PHYSICAL EXAM:  unable to complete at this time.   Diet Order:   Diet Order            Diet NPO time specified Except for: Ice Chips, Sips with Meds  Diet effective now              EDUCATION NEEDS:   Not appropriate for education at this time  Skin:  Skin Assessment: Skin Integrity Issues: Skin Integrity Issues:: Incisions Incisions: abdominal (4/16)  Last BM:  4/15  Height:   Ht Readings from Last 1 Encounters:  03/17/19 6\' 1"  (1.854 m)    Weight:   Wt Readings from Last 1 Encounters:  03/17/19 74.8 kg    Ideal Body Weight:  83.64 kg  BMI:  Body mass index is 21.77 kg/m.  Estimated Nutritional Needs:   Kcal:  2245-2470 kcal  Protein:  110-125 grams  Fluid:  >/= 2.2 L/day     Gerald Matin, MS, RD, LDN, [facility] Inpatient Clinical Dietitian Pager # [phone] After hours/weekend pager # [phone]

## 2019-03-18 NOTE — Progress Notes (Addendum)
1 Day Post-Op   Subjective/Chief Complaint: Sore no flatus   Objective: Vital signs in last 24 hours: Temp:  [97.9 F (36.6 C)-98.6 F (37 C)] 98.3 F (36.8 C) (04/17 0436) Pulse Rate:  [110-124] 114 (04/17 0436) Resp:  [14-18] 18 (04/17 0436) BP: (105-149)/(81-105) 105/81 (04/17 0436) SpO2:  [98 %-100 %] 100 % (04/17 0436) Weight:  [74.8 kg] 74.8 kg (04/16 0919) Last BM Date: 03/16/19  Intake/Output from previous day: 04/16 0701 - 04/17 0700 In: 6396.6 [P.O.:180; I.V.:4408.1; Blood:315; IV Piggyback:1493.5] Out: 3070 [YPPJK:9326; Emesis/NG output:700; Blood:100] Intake/Output this shift: No intake/output data recorded.  General appearance: alert Resp: clear to auscultation bilaterally Cardio: regular rate and rhythm GI: approp tender, no real bs, nondistended Incision/Wound: dressing clean and dry   Lab Results:  Recent Labs    03/17/19 0355 03/17/19 1029 03/18/19 0336  WBC 11.5*  --  8.7  HGB 6.3* 8.2* 6.9*  HCT 24.4* 24.0* 25.7*  PLT 539*  --  472*   BMET Recent Labs    03/17/19 0355 03/17/19 1029 03/18/19 0336  NA 138 137 140  K 4.5 4.7 4.8  CL 106  --  109  CO2 24  --  24  GLUCOSE 118* 163* 123*  BUN 18  --  17  CREATININE 0.53*  --  0.68  CALCIUM 8.2*  --  8.2*   PT/INR Recent Labs    03/16/19 2214  LABPROT 13.2  INR 1.0   ABG No results for input(s): PHART, HCO3 in the last 72 hours.  Invalid input(s): PCO2, PO2  Studies/Results: Ct Angio Chest Pe W And/or Wo Contrast  Result Date: 03/16/2019 CLINICAL DATA:  Weakness with lower abdominal pain 1 day. Diagnosed with lung cancer last year. EXAM: CT ANGIOGRAPHY CHEST CT ABDOMEN AND PELVIS WITH CONTRAST TECHNIQUE: Multidetector CT imaging of the chest was performed using the standard protocol during bolus administration of intravenous contrast. Multiplanar CT image reconstructions and MIPs were obtained to evaluate the vascular anatomy. Multidetector CT imaging of the abdomen and pelvis was  performed using the standard protocol during bolus administration of intravenous contrast. CONTRAST:  169mL OMNIPAQUE IOHEXOL 350 MG/ML SOLN COMPARISON:  Chest CT 08/30/2018 and abdominal CT 06/30/2016 FINDINGS: CTA CHEST FINDINGS Cardiovascular: Heart is normal size. Thoracic aorta is normal in caliber with minimal calcified plaque present. Pulmonary arterial system is within normal. Persistent obstruction of the right upper lobar arteries. Remaining vascular structures are unremarkable. Mediastinum/Nodes: Significant decrease in size in patient's previously seen anterior mediastinal mass over the right side of the anterior mediastinum with mild residual density in this region extending down to the right hilum. This residual mass measures approximately 2.9 x 3.9 cm in transverse in AP dimension (previously 13.1 x 10.5 cm). The right hilar component of this mass measures 2.2 cm in AP dimension (previously 5.7 cm). 1.4 cm left infrahilar node. New 2.4 cm soft tissue nodule abutting the right heart border and new 2 cm soft tissue nodule abutting the left anterolateral heart border just above the level of the ventricular septum. Lungs/Pleura: Lungs are adequately inflated and demonstrate resolution of several small right-sided pulmonary nodules. There is new opacification with bronchiectatic change over the medial right lung with areas of nodularity likely post radiation change. Stable nodule over the medial left upper lobe measuring 12 x 15 mm. Increase in size of a nodule over the posterior left lower lobe measuring 13 mm. Minimal emphysematous disease. No evidence of pleural effusion. Musculoskeletal: Mild degenerative change of the spine. Review  of the MIP images confirms the above findings. CT ABDOMEN and PELVIS FINDINGS Hepatobiliary: 2.3 cm hypodense mass over the posterior segment right lobe of the liver as well as 1.3 cm hypodense mass over the anterior right lobe of the liver likely representing metastatic  disease. Subcentimeter hypodensity over the right lobe which may represent a small cyst versus metastatic disease. Previous cholecystectomy. Biliary tree is unremarkable. Pancreas: Normal Spleen: Normal. Adrenals/Urinary Tract: New 1.8 cm left adrenal nodule likely metastatic focus. Right adrenal gland is normal. Kidneys are normal in size without hydronephrosis or nephrolithiasis. There are a couple subcentimeter right renal cortical hypodensities too small to characterize but likely cysts. Ureters and bladder are normal. Stomach/Bowel: Stomach is within normal. There are multiple fluid and air-filled dilated small bowel loops measuring up to 5.4 cm in diameter. There is an abrupt transition 0.8 in the midline abdomen at the level of the umbilicus with there is an ileo ileal intussusception possibly due to a small lead point mass. There is a second ileocolic intussusception in the right lower quadrant likely due to additional bowel masses. This ileocolic intussusception extends to the hepatic flexure. The colon is decompressed distal to the hepatic flexure. Vascular/Lymphatic: Moderate calcified plaque over the abdominal aorta. Portacaval lymph node measuring 2.2 cm in diameter with adjacent celiac axis node measuring 1.8 cm in diameter. Reproductive: Normal. Other: No significant free fluid or free peritoneal air. Musculoskeletal: Degenerative change of the spine and hips. Review of the MIP images confirms the above findings. IMPRESSION: 1. Small bowel obstruction with abrupt transition point in the midline abdomen at the level of the umbilicus due to ileo ileal intussusception likely secondary to small bowel mass which is likely metastatic disease. There is a second intussusception in the right lower quadrant as there is an ileocolic intussusception extending to the hepatic flexure likely due to multiple bowel masses. 2. Significant decrease in size of patient's anterior mediastinal mass as the residual mass  measures approximately 2.9 x 3.9 cm (previously 13.1 x 10.5 cm). Decreased size of right hilar component of this mass. 1.4 cm low left infrahilar lymph node. New metastatic foci abutting the right and left heart border as described. Resolution of several small right pulmonary nodules with stable and increased size of left pulmonary nodules as described. 3. 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 4. Two new hypodense liver masses with the larger measuring 2.3 cm over the right lobe likely metastatic disease. New 1.8 cm left adrenal nodule likely metastatic disease. Worsening adenopathy in the region of the celiac axis and portacaval region likely metastatic disease. 5. A few subcentimeter right renal cortical hypodensities too small to characterize but likely cysts. 6. Aortic Atherosclerosis (ICD10-I70.0) and Emphysema (ICD10-J43.9). These results were called by telephone at the time of interpretation on 03/16/2019 at 5:15 pm to PA, [facility] , who verbally acknowledged these results. Electronically Signed   By: Marin Olp M.D.   On: 03/16/2019 17:16   Ct Abdomen Pelvis W Contrast  Result Date: 03/16/2019 CLINICAL DATA:  Weakness with lower abdominal pain 1 day. Diagnosed with lung cancer last year. EXAM: CT ANGIOGRAPHY CHEST CT ABDOMEN AND PELVIS WITH CONTRAST TECHNIQUE: Multidetector CT imaging of the chest was performed using the standard protocol during bolus administration of intravenous contrast. Multiplanar CT image reconstructions and MIPs were obtained to evaluate the vascular anatomy. Multidetector CT imaging of the abdomen and pelvis was performed using the standard protocol during bolus administration of  intravenous contrast. CONTRAST:  134mL OMNIPAQUE IOHEXOL 350 MG/ML SOLN COMPARISON:  Chest CT 08/30/2018 and abdominal CT 06/30/2016 FINDINGS: CTA CHEST FINDINGS Cardiovascular: Heart is normal size. Thoracic aorta is normal in  caliber with minimal calcified plaque present. Pulmonary arterial system is within normal. Persistent obstruction of the right upper lobar arteries. Remaining vascular structures are unremarkable. Mediastinum/Nodes: Significant decrease in size in patient's previously seen anterior mediastinal mass over the right side of the anterior mediastinum with mild residual density in this region extending down to the right hilum. This residual mass measures approximately 2.9 x 3.9 cm in transverse in AP dimension (previously 13.1 x 10.5 cm). The right hilar component of this mass measures 2.2 cm in AP dimension (previously 5.7 cm). 1.4 cm left infrahilar node. New 2.4 cm soft tissue nodule abutting the right heart border and new 2 cm soft tissue nodule abutting the left anterolateral heart border just above the level of the ventricular septum. Lungs/Pleura: Lungs are adequately inflated and demonstrate resolution of several small right-sided pulmonary nodules. There is new opacification with bronchiectatic change over the medial right lung with areas of nodularity likely post radiation change. Stable nodule over the medial left upper lobe measuring 12 x 15 mm. Increase in size of a nodule over the posterior left lower lobe measuring 13 mm. Minimal emphysematous disease. No evidence of pleural effusion. Musculoskeletal: Mild degenerative change of the spine. Review of the MIP images confirms the above findings. CT ABDOMEN and PELVIS FINDINGS Hepatobiliary: 2.3 cm hypodense mass over the posterior segment right lobe of the liver as well as 1.3 cm hypodense mass over the anterior right lobe of the liver likely representing metastatic disease. Subcentimeter hypodensity over the right lobe which may represent a small cyst versus metastatic disease. Previous cholecystectomy. Biliary tree is unremarkable. Pancreas: Normal Spleen: Normal. Adrenals/Urinary Tract: New 1.8 cm left adrenal nodule likely metastatic focus. Right adrenal  gland is normal. Kidneys are normal in size without hydronephrosis or nephrolithiasis. There are a couple subcentimeter right renal cortical hypodensities too small to characterize but likely cysts. Ureters and bladder are normal. Stomach/Bowel: Stomach is within normal. There are multiple fluid and air-filled dilated small bowel loops measuring up to 5.4 cm in diameter. There is an abrupt transition 0.8 in the midline abdomen at the level of the umbilicus with there is an ileo ileal intussusception possibly due to a small lead point mass. There is a second ileocolic intussusception in the right lower quadrant likely due to additional bowel masses. This ileocolic intussusception extends to the hepatic flexure. The colon is decompressed distal to the hepatic flexure. Vascular/Lymphatic: Moderate calcified plaque over the abdominal aorta. Portacaval lymph node measuring 2.2 cm in diameter with adjacent celiac axis node measuring 1.8 cm in diameter. Reproductive: Normal. Other: No significant free fluid or free peritoneal air. Musculoskeletal: Degenerative change of the spine and hips. Review of the MIP images confirms the above findings. IMPRESSION: 1. Small bowel obstruction with abrupt transition point in the midline abdomen at the level of the umbilicus due to ileo ileal intussusception likely secondary to small bowel mass which is likely metastatic disease. There is a second intussusception in the right lower quadrant as there is an ileocolic intussusception extending to the hepatic flexure likely due to multiple bowel masses. 2. Significant decrease in size of patient's anterior mediastinal mass as the residual mass measures approximately 2.9 x 3.9 cm (previously 13.1 x 10.5 cm). Decreased size of right hilar component of this mass. 1.4 cm low  left infrahilar lymph node. New metastatic foci abutting the right and left heart border as described. Resolution of several small right pulmonary nodules with stable and  increased size of left pulmonary nodules as described. 3. 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 4. Two new hypodense liver masses with the larger measuring 2.3 cm over the right lobe likely metastatic disease. New 1.8 cm left adrenal nodule likely metastatic disease. Worsening adenopathy in the region of the celiac axis and portacaval region likely metastatic disease. 5. A few subcentimeter right renal cortical hypodensities too small to characterize but likely cysts. 6. Aortic Atherosclerosis (ICD10-I70.0) and Emphysema (ICD10-J43.9). These results were called by telephone at the time of interpretation on 03/16/2019 at 5:15 pm to PA, [facility] , who verbally acknowledged these results. Electronically Signed   By: Marin Olp M.D.   On: 03/16/2019 17:16    Anti-infectives: Anti-infectives (From admission, onward)   Start     Dose/Rate Route Frequency Ordered Stop   03/16/19 2200  piperacillin-tazobactam (ZOSYN) IVPB 3.375 g     3.375 g 12.5 mL/hr over 240 Minutes Intravenous Every 8 hours 03/16/19 2116     03/16/19 1730  piperacillin-tazobactam (ZOSYN) IVPB 3.375 g     3.375 g 100 mL/hr over 30 Minutes Intravenous  Once 03/16/19 1718 03/16/19 1805      Assessment/Plan: POD 1 small bowel resection/right colectomy- Donne Hazel - await pathology, may need to see oncology again now -continue ng tube and expect ileus for some time -dc foley today -ambulate, oob to chair, pulm toilet ABL anemia- chronic before surgery I believe, will give one unit today and recheck in am , do not think bleeding and didn't lose much blood at surgery Continue lovenox, scds FUDonne Hazel I discussed his progress with his son Chon today   Rolm Bookbinder 03/18/2019

## 2019-03-18 NOTE — Plan of Care (Signed)
Plan of care reviewed and discussed with the patient. 

## 2019-03-19 ENCOUNTER — Inpatient Hospital Stay (HOSPITAL_COMMUNITY): Payer: Medicare Other

## 2019-03-19 LAB — BASIC METABOLIC PANEL
Anion gap: 8 (ref 5–15)
BUN: 16 mg/dL (ref 6–20)
CO2: 26 mmol/L (ref 22–32)
Calcium: 8 mg/dL — ABNORMAL LOW (ref 8.9–10.3)
Chloride: 106 mmol/L (ref 98–111)
Creatinine, Ser: 0.49 mg/dL — ABNORMAL LOW (ref 0.61–1.24)
GFR calc Af Amer: >60 mL/min (ref >60)
GFR calc non Af Amer: >60 mL/min (ref >60)
Glucose, Bld: 92 mg/dL (ref 70–99)
Potassium: 3.4 mmol/L — ABNORMAL LOW (ref 3.5–5.1)
Sodium: 140 mmol/L (ref 135–145)

## 2019-03-19 LAB — TYPE AND SCREEN
ABO/RH(D): O POS
Antibody Screen: NEGATIVE
Unit division: 0
Unit division: 0

## 2019-03-19 LAB — CBC
HCT: 27.3 % — ABNORMAL LOW (ref 39.0–52.0)
Hemoglobin: 7.5 g/dL — ABNORMAL LOW (ref 13.0–17.0)
MCH: 19.7 pg — ABNORMAL LOW (ref 26.0–34.0)
MCHC: 27.5 g/dL — ABNORMAL LOW (ref 30.0–36.0)
MCV: 71.8 fL — ABNORMAL LOW (ref 80.0–100.0)
Platelets: 405 10*3/uL — ABNORMAL HIGH (ref 150–400)
RBC: 3.8 MIL/uL — ABNORMAL LOW (ref 4.22–5.81)
RDW: 22.6 % — ABNORMAL HIGH (ref 11.5–15.5)
WBC: 9 10*3/uL (ref 4.0–10.5)
nRBC: 0 % (ref 0.0–0.2)

## 2019-03-19 LAB — BPAM RBC
Blood Product Expiration Date: 202004252359
Blood Product Expiration Date: 202004262359
ISSUE DATE / TIME: 202004160621
ISSUE DATE / TIME: 202004170950
Unit Type and Rh: 5100
Unit Type and Rh: 5100

## 2019-03-19 MED ORDER — PANTOPRAZOLE SODIUM 40 MG IV SOLR
40.0000 mg | INTRAVENOUS | Status: DC
  Administered 2019-03-19 – 2019-03-25 (×7): 40 mg via INTRAVENOUS
  Filled 2019-03-19 (×7): qty 40

## 2019-03-19 NOTE — Progress Notes (Signed)
2 Days Post-Op SBR, R col  Subjective/Chief Complaint: Pain controlled, no flatus.  Urinating without foley   Objective: Vital signs in last 24 hours: Temp:  [97.6 F (36.4 C)-98.2 F (36.8 C)] 97.9 F (36.6 C) (04/18 0524) Pulse Rate:  [104-113] 110 (04/18 0524) Resp:  [16-18] 18 (04/18 0524) BP: (96-120)/(67-89) 120/89 (04/18 0524) SpO2:  [97 %-100 %] 97 % (04/18 0524) Last BM Date: 03/16/19  Intake/Output from previous day: 04/17 0701 - 04/18 0700 In: 2723.6 [P.O.:180; I.V.:1727.6; Blood:366; IV Piggyback:450] Out: 2625 [Urine:1175; Emesis/NG output:1450] Intake/Output this shift: Total I/O In: -  Out: 300 [Urine:300]  General appearance: alert Resp: clear to auscultation bilaterally Cardio: regular rate and rhythm GI: approp tender, no real bs, nondistended Incision/Wound: dressing clean and dry   Lab Results:  Recent Labs    03/18/19 0336 03/18/19 1626 03/19/19 0313  WBC 8.7  --  9.0  HGB 6.9* 7.9* 7.5*  HCT 25.7* 28.8* 27.3*  PLT 472*  --  405*   BMET Recent Labs    03/18/19 0336 03/19/19 0313  NA 140 140  K 4.8 3.4*  CL 109 106  CO2 24 26  GLUCOSE 123* 92  BUN 17 16  CREATININE 0.68 0.49*  CALCIUM 8.2* 8.0*   PT/INR Recent Labs    03/16/19 2214  LABPROT 13.2  INR 1.0   ABG No results for input(s): PHART, HCO3 in the last 72 hours.  Invalid input(s): PCO2, PO2  Studies/Results: Dg Abd 1 View  Result Date: 03/18/2019 CLINICAL DATA:  Check gastric catheter placement EXAM: ABDOMEN - 1 VIEW COMPARISON:  None. FINDINGS: Gastric catheter is noted extending into the stomach. Proximal side port appears to lie within the distal esophagus. Scattered small bowel dilatation is again noted. IMPRESSION: Gastric catheter as described. This could be advanced several cm further into the stomach. Electronically Signed   By: Inez Catalina M.D.   On: 03/18/2019 21:07    Anti-infectives: Anti-infectives (From admission, onward)   Start     Dose/Rate  Route Frequency Ordered Stop   03/16/19 2200  piperacillin-tazobactam (ZOSYN) IVPB 3.375 g  Status:  Discontinued     3.375 g 12.5 mL/hr over 240 Minutes Intravenous Every 8 hours 03/16/19 2116 03/18/19 0919   03/16/19 1730  piperacillin-tazobactam (ZOSYN) IVPB 3.375 g     3.375 g 100 mL/hr over 30 Minutes Intravenous  Once 03/16/19 1718 03/16/19 1805      Assessment/Plan: POD 2 small bowel resection/right colectomy- Gerald Adkins - await pathology, may need to see oncology again  -continue ng tube and expect ileus for some time, advance 5 cm -ambulate, oob to chair, pulm toilet ABL anemia- chronic before surgery, s/p 1 unit 4/17.  Continue lovenox, scds [name]  I discussed his progress with his son Gerald Adkins today   Gerald Adkins 2/95/1882

## 2019-03-19 NOTE — Progress Notes (Signed)
Physical Therapy Treatment Patient Details Name: Gerald Adkins MRN: [id number] DOB: 01/13/1956 Today's Date: 03/19/2019    History of Present Illness Pt is a 63 year old male s/p small bowel resection/right colectomy secondary to metastatic melanoma on 03/17/19 with hx of CVA, opiate addiction, lumbar spine surgery    PT Comments    Pt ambulated in hallway distance to tolerance.  Pt declined premedication today however does report pain with mobility.    Follow Up Recommendations  No PT follow up     Equipment Recommendations  None recommended by PT    Recommendations for Other Services       Precautions / Restrictions Precautions Precautions: Fall Precaution Comments: NG tube    Mobility  Bed Mobility               General bed mobility comments: pt up in recliner on arrival  Transfers Overall transfer level: Needs assistance Equipment used: Rolling walker (2 wheeled) Transfers: Sit to/from Stand Sit to Stand: Min guard         General transfer comment: min/guard for safety, cues for hand placement  Ambulation/Gait Ambulation/Gait assistance: Min guard Gait Distance (Feet): 120 Feet Assistive device: Rolling walker (2 wheeled) Gait Pattern/deviations: Step-through pattern;Decreased stride length;Trunk flexed     General Gait Details: distance to tolerance, cues for posture and RW distance   Stairs             Wheelchair Mobility    Modified Rankin (Stroke Patients Only)       Balance                                            Cognition Arousal/Alertness: Awake/alert Behavior During Therapy: WFL for tasks assessed/performed Overall Cognitive Status: Within Functional Limits for tasks assessed                                        Exercises      General Comments        Pertinent Vitals/Pain Pain Assessment: 0-10 Pain Score: 8  Pain Location: surgical site Pain Descriptors / Indicators:  Tender;Sore Pain Intervention(s): Monitored during session;Repositioned(declined premed)    Home Living                      Prior Function            PT Goals (current goals can now be found in the care plan section) Progress towards PT goals: Progressing toward goals    Frequency    Min 3X/week      PT Plan Current plan remains appropriate    Co-evaluation              AM-PAC PT "6 Clicks" Mobility   Outcome Measure  Help needed turning from your back to your side while in a flat bed without using bedrails?: A Little Help needed moving from lying on your back to sitting on the side of a flat bed without using bedrails?: A Little Help needed moving to and from a bed to a chair (including a wheelchair)?: A Little Help needed standing up from a chair using your arms (e.g., wheelchair or bedside chair)?: A Little Help needed to walk in hospital room?: A Little Help needed climbing 3-5 steps with  a railing? : A Little 6 Click Score: 18    End of Session   Activity Tolerance: Patient tolerated treatment well Patient left: in chair;with call bell/phone within reach Nurse Communication: Mobility status PT Visit Diagnosis: Difficulty in walking, not elsewhere classified (R26.2)     Time: 9643-8381 PT Time Calculation (min) (ACUTE ONLY): 10 min  Charges:  $Gait Training: 8-22 mins                     [name], PT, DPT Acute Rehabilitation Services Office: [phone] Pager: [phone]   Trena Platt 03/19/2019, 3:43 PM

## 2019-03-19 NOTE — Plan of Care (Signed)
  Problem: Health Behavior/Discharge Planning: Goal: Ability to manage health-related needs will improve Outcome: Progressing   Problem: Clinical Measurements: Goal: Ability to maintain clinical measurements within normal limits will improve Outcome: Progressing Goal: Will remain free from infection Outcome: Progressing Goal: Diagnostic test results will improve Outcome: Progressing Goal: Respiratory complications will improve Outcome: Progressing Goal: Cardiovascular complication will be avoided Outcome: Progressing   Problem: Activity: Goal: Risk for activity intolerance will decrease Outcome: Progressing   Problem: Nutrition: Goal: Adequate nutrition will be maintained Outcome: Progressing   Problem: Coping: Goal: Level of anxiety will decrease Outcome: Progressing   Problem: Elimination: Goal: Will not experience complications related to bowel motility Outcome: Progressing Goal: Will not experience complications related to urinary retention Outcome: Progressing   Problem: Pain Managment: Goal: General experience of comfort will improve Outcome: Progressing   Problem: Safety: Goal: Ability to remain free from injury will improve Outcome: Progressing   Problem: Skin Integrity: Goal: Risk for impaired skin integrity will decrease Outcome: Progressing   Problem: Education: Goal: Required Educational Video(s) Outcome: Progressing   Problem: Clinical Measurements: Goal: Postoperative complications will be avoided or minimized Outcome: Progressing   Problem: Skin Integrity: Goal: Demonstration of wound healing without infection will improve Outcome: Progressing   

## 2019-03-19 NOTE — Progress Notes (Signed)
During rounds, RN noticed NG tube output blood tinged, dark drainage, a change from earlier brown drainage.  Tubing changed out, Ng tube placement xray ordered. Dr. Marlou Starks MD paged and made aware. Orders received for protonix 40mg  IV q 24hrs, and morning draw CBC.  RN will monitor.

## 2019-03-20 LAB — CBC
HCT: 26.8 % — ABNORMAL LOW (ref 39.0–52.0)
Hemoglobin: 7.1 g/dL — ABNORMAL LOW (ref 13.0–17.0)
MCH: 19.3 pg — ABNORMAL LOW (ref 26.0–34.0)
MCHC: 26.5 g/dL — ABNORMAL LOW (ref 30.0–36.0)
MCV: 73 fL — ABNORMAL LOW (ref 80.0–100.0)
Platelets: 409 10*3/uL — ABNORMAL HIGH (ref 150–400)
RBC: 3.67 MIL/uL — ABNORMAL LOW (ref 4.22–5.81)
RDW: 23.5 % — ABNORMAL HIGH (ref 11.5–15.5)
WBC: 9.4 10*3/uL (ref 4.0–10.5)
nRBC: 0 % (ref 0.0–0.2)

## 2019-03-20 MED ORDER — LACTATED RINGERS IV BOLUS
1000.0000 mL | Freq: Once | INTRAVENOUS | Status: AC
  Administered 2019-03-20: 14:00:00 1000 mL via INTRAVENOUS

## 2019-03-20 MED ORDER — POTASSIUM CHLORIDE 10 MEQ/100ML IV SOLN
10.0000 meq | INTRAVENOUS | Status: AC
  Administered 2019-03-20 (×4): 10 meq via INTRAVENOUS
  Filled 2019-03-20 (×4): qty 100

## 2019-03-20 NOTE — Progress Notes (Signed)
PT Cancellation Note  Patient Details Name: Gerald Adkins MRN: [id number] DOB: [birth year]   Cancelled Treatment:    Reason Eval/Treat Not Completed: Medical issues which prohibited therapy RN reports low BP and pt about to receive bolus.   [name],[name] 03/20/2019, 2:14 PM [name], PT, DPT Acute Rehabilitation Services Office: [phone] Pager: [phone]

## 2019-03-20 NOTE — Progress Notes (Signed)
3 Days Post-Op SBR, R col  Subjective/Chief Complaint: Pain controlled, no flatus.  Ambulated once yesterday   Objective: Vital signs in last 24 hours: Temp:  [97.5 F (36.4 C)-98.3 F (36.8 C)] 97.9 F (36.6 C) (04/19 0558) Pulse Rate:  [76-108] 105 (04/19 0558) Resp:  [16-18] 16 (04/19 0558) BP: (113-123)/(82-87) 113/84 (04/19 0558) SpO2:  [96 %-100 %] 100 % (04/19 0558) Last BM Date: 03/16/19  Intake/Output from previous day: 04/18 0701 - 04/19 0700 In: 2658.5 [P.O.:210; I.V.:2296.6; IV Piggyback:151.9] Out: 2300 [Urine:700; Emesis/NG output:1600] Intake/Output this shift: No intake/output data recorded.  General appearance: alert Resp: clear to auscultation bilaterally Cardio: regular rate and rhythm GI: approp tender, distended Incision/Wound: clean and dry   Lab Results:  Recent Labs    03/19/19 0313 03/20/19 0306  WBC 9.0 9.4  HGB 7.5* 7.1*  HCT 27.3* 26.8*  PLT 405* 409*   BMET Recent Labs    03/18/19 0336 03/19/19 0313  NA 140 140  K 4.8 3.4*  CL 109 106  CO2 24 26  GLUCOSE 123* 92  BUN 17 16  CREATININE 0.68 0.49*  CALCIUM 8.2* 8.0*   PT/INR No results for input(s): LABPROT, INR in the last 72 hours. ABG No results for input(s): PHART, HCO3 in the last 72 hours.  Invalid input(s): PCO2, PO2  Studies/Results: Dg Abd 1 View  Result Date: 03/19/2019 CLINICAL DATA:  NG tube placement EXAM: ABDOMEN - 1 VIEW COMPARISON:  None. FINDINGS: An NG tube is identified with side hole and tip overlying the proximal stomach IMPRESSION: NG tube within the proximal stomach. Electronically Signed   By: Margarette Canada M.D.   On: 03/19/2019 16:23   Dg Abd 1 View  Result Date: 03/18/2019 CLINICAL DATA:  Check gastric catheter placement EXAM: ABDOMEN - 1 VIEW COMPARISON:  None. FINDINGS: Gastric catheter is noted extending into the stomach. Proximal side port appears to lie within the distal esophagus. Scattered small bowel dilatation is again noted. IMPRESSION:  Gastric catheter as described. This could be advanced several cm further into the stomach. Electronically Signed   By: Inez Catalina M.D.   On: 03/18/2019 21:07    Anti-infectives: Anti-infectives (From admission, onward)   Start     Dose/Rate Route Frequency Ordered Stop   03/16/19 2200  piperacillin-tazobactam (ZOSYN) IVPB 3.375 g  Status:  Discontinued     3.375 g 12.5 mL/hr over 240 Minutes Intravenous Every 8 hours 03/16/19 2116 03/18/19 0919   03/16/19 1730  piperacillin-tazobactam (ZOSYN) IVPB 3.375 g     3.375 g 100 mL/hr over 30 Minutes Intravenous  Once 03/16/19 1718 03/16/19 1805      Assessment/Plan: POD 3 small bowel resection/right colectomy- Donne Hazel - await pathology, may need to see oncology again  -hypokalemia: replace K IV -continue ng tube and expect ileus for some time -ambulate, oob to chair, pulm toilet ABL anemia- chronic before surgery, s/p 1 unit 4/17.  Continue lovenox, scds FUDonne Hazel  I discussed his progress with his son Anquan today ([phone])   Rosario Adie 05/27/349

## 2019-03-20 NOTE — Progress Notes (Addendum)
BP 84/67, HR 105 when vitals taken by aide. Upon assessment, pt did c/o mild dizziness while resting in chair.  Dr. Marcello Moores paged and made aware.  Order received for 1L LR bolus. RN will monitor and reassess.

## 2019-03-21 LAB — CULTURE, BLOOD (ROUTINE X 2)
Culture: NO GROWTH
Culture: NO GROWTH

## 2019-03-21 LAB — BASIC METABOLIC PANEL
Anion gap: 9 (ref 5–15)
BUN: 9 mg/dL (ref 6–20)
CO2: 23 mmol/L (ref 22–32)
Calcium: 7.6 mg/dL — ABNORMAL LOW (ref 8.9–10.3)
Chloride: 104 mmol/L (ref 98–111)
Creatinine, Ser: 0.46 mg/dL — ABNORMAL LOW (ref 0.61–1.24)
GFR calc Af Amer: >60 mL/min (ref >60)
GFR calc non Af Amer: >60 mL/min (ref >60)
Glucose, Bld: 75 mg/dL (ref 70–99)
Potassium: 3.2 mmol/L — ABNORMAL LOW (ref 3.5–5.1)
Sodium: 136 mmol/L (ref 135–145)

## 2019-03-21 LAB — CBC
HCT: 31.4 % — ABNORMAL LOW (ref 39.0–52.0)
Hemoglobin: 8.2 g/dL — ABNORMAL LOW (ref 13.0–17.0)
MCH: 19.2 pg — ABNORMAL LOW (ref 26.0–34.0)
MCHC: 26.1 g/dL — ABNORMAL LOW (ref 30.0–36.0)
MCV: 73.5 fL — ABNORMAL LOW (ref 80.0–100.0)
Platelets: 469 10*3/uL — ABNORMAL HIGH (ref 150–400)
RBC: 4.27 MIL/uL (ref 4.22–5.81)
RDW: 24.6 % — ABNORMAL HIGH (ref 11.5–15.5)
WBC: 9 10*3/uL (ref 4.0–10.5)
nRBC: 0 % (ref 0.0–0.2)

## 2019-03-21 LAB — MAGNESIUM: Magnesium: 1.7 mg/dL (ref 1.7–2.4)

## 2019-03-21 MED ORDER — POTASSIUM CHLORIDE 10 MEQ/100ML IV SOLN
10.0000 meq | INTRAVENOUS | Status: AC
  Administered 2019-03-21 (×4): 10 meq via INTRAVENOUS
  Filled 2019-03-21 (×2): qty 100

## 2019-03-21 NOTE — Care Management Important Message (Signed)
Important Message  Patient Details IM Letter given to Case Manager to present to the Patient Name: MERVIN RAMIRES MRN: [id number] Date of Birth: 03/06/1956   Medicare Important Message Given:  Yes    Kerin Salen 03/21/2019, 11:34 AM

## 2019-03-21 NOTE — Progress Notes (Signed)
Physical Therapy Treatment Patient Details Name: Gerald Adkins MRN: [id number] DOB: 1956/05/18 Today's Date: 03/21/2019    History of Present Illness Pt is a 63 year old male s/p small bowel resection/right colectomy secondary to metastatic melanoma on 03/17/19 with hx of CVA, opiate addiction, lumbar spine surgery    PT Comments    Pt highly motivated.  Mild c/o dizziness "from pain meds" stated pt.  Assisted with amb full unit with walker just for safety.  Good alternating gait.  Good safety cognition.  Progressing well.  Follow Up Recommendations  No PT follow up     Equipment Recommendations  None recommended by PT    Recommendations for Other Services       Precautions / Restrictions Precautions Precautions: Fall Precaution Comments: NG tube Restrictions Weight Bearing Restrictions: No    Mobility  Bed Mobility               General bed mobility comments: pt up in recliner on arrival  Transfers Overall transfer level: Needs assistance Equipment used: Rolling walker (2 wheeled) Transfers: Sit to/from Stand Sit to Stand: Min guard Stand pivot transfers: Min guard       General transfer comment: min/guard for safety IV line   Ambulation/Gait Ambulation/Gait assistance: Min guard Gait Distance (Feet): 400 Feet Assistive device: Rolling walker (2 wheeled) Gait Pattern/deviations: Step-through pattern;Decreased stride length;Trunk flexed Gait velocity: decreased    General Gait Details: tolerated an increased distance    Chief Strategy Officer    Modified Rankin (Stroke Patients Only)       Balance                                            Cognition Arousal/Alertness: Awake/alert Behavior During Therapy: WFL for tasks assessed/performed Overall Cognitive Status: Within Functional Limits for tasks assessed                                        Exercises      General Comments         Pertinent Vitals/Pain Pain Assessment: Faces Faces Pain Scale: Hurts a little bit Pain Location: surgical site Pain Descriptors / Indicators: Tender;Sore Pain Intervention(s): Monitored during session;Premedicated before session    Home Living                      Prior Function            PT Goals (current goals can now be found in the care plan section) Progress towards PT goals: Progressing toward goals    Frequency    Min 3X/week      PT Plan Current plan remains appropriate    Co-evaluation              AM-PAC PT "6 Clicks" Mobility   Outcome Measure  Help needed turning from your back to your side while in a flat bed without using bedrails?: A Little Help needed moving from lying on your back to sitting on the side of a flat bed without using bedrails?: A Little Help needed moving to and from a bed to a chair (including a wheelchair)?: A Little Help needed standing up from a chair using your  arms (e.g., wheelchair or bedside chair)?: A Little Help needed to walk in hospital room?: A Little Help needed climbing 3-5 steps with a railing? : A Little 6 Click Score: 18    End of Session Equipment Utilized During Treatment: Gait belt Activity Tolerance: Patient tolerated treatment well Patient left: in chair;with call bell/phone within reach Nurse Communication: Mobility status PT Visit Diagnosis: Difficulty in walking, not elsewhere classified (R26.2)     Time: 2620-3559 PT Time Calculation (min) (ACUTE ONLY): 17 min  Charges:  $Gait Training: 8-22 mins                     {Kimmy Totten  PTA Acute  Rehabilitation [employer]      [phone] Office      [phone]

## 2019-03-21 NOTE — Progress Notes (Addendum)
[facility] Progress Note  4 Days Post-Op  Subjective: CC-  Complaining of increased upper abdominal pain this morning that started yesterday afternoon. He feels bloated. Burping. Denies n/v. Passed gas x1 yesterday, no BM. NG tube with 950cc output last 24 hours.  Objective: Vital signs in last 24 hours: Temp:  [97.5 F (36.4 C)-98.4 F (36.9 C)] 98 F (36.7 C) (04/20 0548) Pulse Rate:  [101-111] 104 (04/20 0548) Resp:  [16-18] 16 (04/20 0548) BP: (84-108)/(64-80) 108/69 (04/20 0548) SpO2:  [98 %-100 %] 98 % (04/20 0548) Last BM Date: 03/16/19  Intake/Output from previous day: 04/19 0701 - 04/20 0700 In: 2353.6 [P.O.:200; I.V.:703.6; IV Piggyback:1450] Out: 1850 [Urine:900; Emesis/NG output:950] Intake/Output this shift: No intake/output data recorded.  PE: Gen:  Alert, NAD HEENT: EOM's intact, pupils equal and round Card:  RRR Pulm:  CTAB, no W/R/R, effort normal Abd: Soft, mild distension, hypoactive BS, midline incision cdi with staples intact, mild upper abdominal TTP Skin: warm and dry  Lab Results:  Recent Labs    03/20/19 0306 03/21/19 0330  WBC 9.4 9.0  HGB 7.1* 8.2*  HCT 26.8* 31.4*  PLT 409* 469*   BMET Recent Labs    03/19/19 0313 03/21/19 0330  NA 140 136  K 3.4* 3.2*  CL 106 104  CO2 26 23  GLUCOSE 92 75  BUN 16 9  CREATININE 0.49* 0.46*  CALCIUM 8.0* 7.6*   PT/INR No results for input(s): LABPROT, INR in the last 72 hours. CMP     Component Value Date/Time   NA 136 03/21/2019 0330   K 3.2 (L) 03/21/2019 0330   CL 104 03/21/2019 0330   CO2 23 03/21/2019 0330   GLUCOSE 75 03/21/2019 0330   BUN 9 03/21/2019 0330   CREATININE 0.46 (L) 03/21/2019 0330   CREATININE 0.59 (L) 09/29/2018 1427   CALCIUM 7.6 (L) 03/21/2019 0330   PROT 7.0 03/16/2019 1512   ALBUMIN 3.0 (L) 03/16/2019 1512   AST 14 (L) 03/16/2019 1512   AST 11 (L) 09/29/2018 1427   ALT 8 03/16/2019 1512   ALT 9 09/29/2018 1427   ALKPHOS 80 03/16/2019 1512    BILITOT 0.4 03/16/2019 1512   BILITOT 0.2 (L) 09/29/2018 1427   GFRNONAA >60 03/21/2019 0330   GFRNONAA >60 09/29/2018 1427   GFRAA >60 03/21/2019 0330   GFRAA >60 09/29/2018 1427   Lipase     Component Value Date/Time   LIPASE 22 03/16/2019 1512       Studies/Results: Dg Abd 1 View  Result Date: 03/19/2019 CLINICAL DATA:  NG tube placement EXAM: ABDOMEN - 1 VIEW COMPARISON:  None. FINDINGS: An NG tube is identified with side hole and tip overlying the proximal stomach IMPRESSION: NG tube within the proximal stomach. Electronically Signed   By: Margarette Canada M.D.   On: 03/19/2019 16:23    Anti-infectives: Anti-infectives (From admission, onward)   Start     Dose/Rate Route Frequency Ordered Stop   03/16/19 2200  piperacillin-tazobactam (ZOSYN) IVPB 3.375 g  Status:  Discontinued     3.375 g 12.5 mL/hr over 240 Minutes Intravenous Every 8 hours 03/16/19 2116 03/18/19 0919   03/16/19 1730  piperacillin-tazobactam (ZOSYN) IVPB 3.375 g     3.375 g 100 mL/hr over 30 Minutes Intravenous  Once 03/16/19 1718 03/16/19 1805       Assessment/Plan H/o CVA HTN HLD H/o metastatic melanoma - followed by Dr. Mckinley Jewel  SBO S/p Right hemicolectomy, small bowel resection 4/16 Dr. Donne Hazel  -  History of metastatic melanoma, small bowel obstruction x2 with what appears to be intussusception secondary to metastatic melanoma  - POD 4  - NG tube still with high output, minimal flatus and no BM  ABL anemia- chronic before surgery, s/p 1 unit 4/17.  Hgb 8.2 from 7.1, stable  ID - zosyn 4/15>>4/17 FEN - IVF, NPO VTE - SCDs, lovenox Foley - none Follow up - Dr. Donne Hazel  Plan - Honeycomb dressing removed. Continue NPO/NGT and await return in bowel function. Encourage ambulation/OOB. Continue PT. Replace potassium and check mag.   Patient states he has not eaten since 4/12, need to consider TPN if he is not opening up soon.  Spoke with patient's son Danyl today [phone]) and  updated him on Mr. Eskew progress.   LOS: 5 days    Wellington Hampshire , [facility] 03/21/2019, 8:12 AM Pager: [phone]  Agree with above.  Still not much bowel function - so he still has his NGT. Otherwise seems to be doing okay.  His path showed metastatic melanoma - I made a copy for him.  Alphonsa Overall, MD, [facility] Pager: [phone] Office phone:  [phone]

## 2019-03-21 NOTE — Progress Notes (Signed)
Patient educated on the importance of ambulation, pt refusing at this time. Will continue encourage patient.  Norlene Duel RN, BSN

## 2019-03-22 LAB — CBC
HCT: 28 % — ABNORMAL LOW (ref 39.0–52.0)
Hemoglobin: 7.5 g/dL — ABNORMAL LOW (ref 13.0–17.0)
MCH: 19.5 pg — ABNORMAL LOW (ref 26.0–34.0)
MCHC: 26.8 g/dL — ABNORMAL LOW (ref 30.0–36.0)
MCV: 72.9 fL — ABNORMAL LOW (ref 80.0–100.0)
Platelets: 352 10*3/uL (ref 150–400)
RBC: 3.84 MIL/uL — ABNORMAL LOW (ref 4.22–5.81)
RDW: 24.5 % — ABNORMAL HIGH (ref 11.5–15.5)
WBC: 6.7 10*3/uL (ref 4.0–10.5)
nRBC: 0 % (ref 0.0–0.2)

## 2019-03-22 LAB — BASIC METABOLIC PANEL
Anion gap: 8 (ref 5–15)
BUN: 7 mg/dL (ref 6–20)
CO2: 23 mmol/L (ref 22–32)
Calcium: 7.3 mg/dL — ABNORMAL LOW (ref 8.9–10.3)
Chloride: 102 mmol/L (ref 98–111)
Creatinine, Ser: 0.46 mg/dL — ABNORMAL LOW (ref 0.61–1.24)
GFR calc Af Amer: >60 mL/min (ref >60)
GFR calc non Af Amer: >60 mL/min (ref >60)
Glucose, Bld: 74 mg/dL (ref 70–99)
Potassium: 3.3 mmol/L — ABNORMAL LOW (ref 3.5–5.1)
Sodium: 133 mmol/L — ABNORMAL LOW (ref 135–145)

## 2019-03-22 MED ORDER — POTASSIUM CHLORIDE 10 MEQ/100ML IV SOLN
10.0000 meq | INTRAVENOUS | Status: AC
  Administered 2019-03-22 (×4): 10 meq via INTRAVENOUS
  Filled 2019-03-22 (×4): qty 100

## 2019-03-22 NOTE — Progress Notes (Signed)
I called and spoke with patient's son Federico [phone]) and updated him on Mr. Reth progress.  Wellington Hampshire, [facility] 03/22/2019, 10:06 AM Pager: [phone] Mon 7:00 am -11:30 AM Tues-Fri 7:00 am-4:30 pm Sat-Sun 7:00 am-11:30 am

## 2019-03-22 NOTE — Discharge Instructions (Addendum)
[facility]      [facility] °[phone] ° °OPEN ABDOMINAL SURGERY: POST OP INSTRUCTIONS ° °Always review your discharge instruction sheet given to you by the facility where your surgery was performed. ° °IF YOU HAVE DISABILITY OR FAMILY LEAVE FORMS, YOU MUST BRING THEM TO THE OFFICE FOR PROCESSING.  PLEASE DO NOT GIVE THEM TO YOUR DOCTOR. ° °1. A prescription for pain medication may be given to you upon discharge.  Take your pain medication as prescribed, if needed.  If narcotic pain medicine is not needed, then you may take acetaminophen (Tylenol) or ibuprofen (Advil) as needed. °2. Take your usually prescribed medications unless otherwise directed. °3. If you need a refill on your pain medication, please contact your pharmacy. They will contact our office to request authorization.  Prescriptions will not be filled after 5pm or on week-ends. °4. You should follow a light diet the first few days after arrival home, such as soup and crackers, pudding, etc.unless your doctor has advised otherwise. A high-fiber, low fat diet can be resumed as tolerated.   Be sure to include lots of fluids daily. Most patients will experience some swelling and bruising on the chest and neck area.  Ice packs will help.  Swelling and bruising can take several days to resolve °5. Most patients will experience some swelling and bruising in the area of the incision. Ice pack will help. Swelling and bruising can take several days to resolve..  °6. It is common to experience some constipation if taking pain medication after surgery.  Increasing fluid intake and taking a stool softener will usually help or prevent this problem from occurring.  A mild laxative (Milk of Magnesia or Miralax) should be taken according to package directions if there are no bowel movements after 48 hours. °7.  You may have steri-strips (small skin tapes) in place directly over the incision.  These strips should be left on the skin for 7-10 days.  If your  surgeon used skin glue on the incision, you may shower in 24 hours.  The glue will flake off over the next 2-3 weeks.  Any sutures or staples will be removed at the office during your follow-up visit. You may find that a light gauze bandage over your incision may keep your staples from being rubbed or pulled. You may shower and replace the bandage daily. °8. ACTIVITIES:  You may resume regular (light) daily activities beginning the next day--such as daily self-care, walking, climbing stairs--gradually increasing activities as tolerated.  You may have sexual intercourse when it is comfortable.  Refrain from any heavy lifting or straining until approved by your doctor. °a. You may drive when you no longer are taking prescription pain medication, you can comfortably wear a seatbelt, and you can safely maneuver your car and apply brakes °b. Return to Work: ___________________________________ °9. You should see your doctor in the office for a follow-up appointment approximately two weeks after your surgery.  Make sure that you call for this appointment within a day or two after you arrive home to insure a convenient appointment time. °OTHER INSTRUCTIONS:  °_____________________________________________________________ °_____________________________________________________________ ° °WHEN TO CALL YOUR DOCTOR: °1. Fever over 101.0 °2. Inability to urinate °3. Nausea and/or vomiting °4. Extreme swelling or bruising °5. Continued bleeding from incision. °6. Increased pain, redness, or drainage from the incision. °7. Difficulty swallowing or breathing °8. Muscle cramping or spasms. °9. Numbness or tingling in hands or feet or around lips. ° °The clinic staff is available to   answer your questions during regular business hours.  Please dont hesitate to call and ask to speak to one of the nurses if you have concerns.  For further questions, please visit [email or web address]    MIDLINE WOUND CARE: - midline  dressing to be changed daily - supplies: sterile saline, gauze, scissors, ABD pads, tape  - remove dressing and all packing carefully, moistening with sterile saline as needed to avoid packing/internal dressing sticking to the wound. - clean edges of skin around the wound with water/gauze, making sure there is no tape debris or leakage left on skin that could cause skin irritation or breakdown. - dampen a clean gauze with sterile saline and pack wound from wound base to skin level, making sure to take note of any possible areas of wound tracking, tunneling and packing appropriately. Wound can be packed loosely. Trim gauze to size if a whole gauze is not required. - cover wound with a dry ABD pad and secure with tape.  - write the date/time on the dry dressing/tape to better track when the last dressing change occurred. - change dressing as needed if leakage occurs, wound gets contaminated, or patient requests to shower. - patient may shower daily with wound open and following the shower the wound should be dried and a clean dressing placed.

## 2019-03-22 NOTE — Progress Notes (Signed)
Residual at 3:30pm was 40mL. Removed NG tube. Patient tolerated well.

## 2019-03-22 NOTE — Progress Notes (Signed)
5 Days Post-Op   Subjective/Chief Complaint: Passing a lot of flatus, no bm, ambulating, pain controlled   Objective: Vital signs in last 24 hours: Temp:  [97.7 F (36.5 C)-98.7 F (37.1 C)] 98.3 F (36.8 C) (04/21 0511) Pulse Rate:  [98-117] 109 (04/21 0511) Resp:  [16-20] 20 (04/21 0511) BP: (92-128)/(66-89) 112/73 (04/21 0511) SpO2:  [99 %-100 %] 99 % (04/21 0511) Last BM Date: 03/16/19  Intake/Output from previous day: 04/20 0701 - 04/21 0700 In: 3166.4 [I.V.:2400; IV Piggyback:766.4] Out: 1250 [Urine:900; Emesis/NG output:350] Intake/Output this shift: No intake/output data recorded.  General appearance: alert Resp: clear to auscultation bilaterally Cardio: regular rate and rhythm GI: approp tender, bs present, wound clean, nondistended  Lab Results:   Recent Labs    03/21/19 0330 03/22/19 0426  WBC 9.0 6.7  HGB 8.2* 7.5*  HCT 31.4* 28.0*  PLT 469* 352   BMET Recent Labs    03/21/19 0330 03/22/19 0426  NA 136 133*  K 3.2* 3.3*  CL 104 102  CO2 23 23  GLUCOSE 75 74  BUN 9 7  CREATININE 0.46* 0.46*  CALCIUM 7.6* 7.3*   PT/INR No results for input(s): LABPROT, INR in the last 72 hours. ABG No results for input(s): PHART, HCO3 in the last 72 hours.  Invalid input(s): PCO2, PO2  Studies/Results: No results found.  Anti-infectives: Anti-infectives (From admission, onward)   Start     Dose/Rate Route Frequency Ordered Stop   03/16/19 2200  piperacillin-tazobactam (ZOSYN) IVPB 3.375 g  Status:  Discontinued     3.375 g 12.5 mL/hr over 240 Minutes Intravenous Every 8 hours 03/16/19 2116 03/18/19 0919   03/16/19 1730  piperacillin-tazobactam (ZOSYN) IVPB 3.375 g     3.375 g 100 mL/hr over 30 Minutes Intravenous  Once 03/16/19 1718 03/16/19 1805      Assessment/Plan: POD 5 small bowel resection/right colectomy- Gerald Adkins - path c/w met melanoma, will need oncology f/u as outpatient -appears ileus resolving, will clamp ng tube (was difficult to  put in) and remove later today if does well, will give him clear liquids also -ambulate, oob to chair, pulm toilet ABL anemia- chronic and hct stable overall ID- periop zosyn FEN- decrease iv fluids, start clears to see if tolerates, will hold on tpn now Hypokalemia-replace again today and recheck in am Continue lovenox, scds FU- Gerald Adkins dispo home hopefully next 48 hours  Rolm Bookbinder 03/22/2019

## 2019-03-23 LAB — BASIC METABOLIC PANEL
Anion gap: 6 (ref 5–15)
BUN: 6 mg/dL (ref 6–20)
CO2: 26 mmol/L (ref 22–32)
Calcium: 7.5 mg/dL — ABNORMAL LOW (ref 8.9–10.3)
Chloride: 102 mmol/L (ref 98–111)
Creatinine, Ser: 0.42 mg/dL — ABNORMAL LOW (ref 0.61–1.24)
GFR calc Af Amer: >60 mL/min (ref >60)
GFR calc non Af Amer: >60 mL/min (ref >60)
Glucose, Bld: 96 mg/dL (ref 70–99)
Potassium: 3.2 mmol/L — ABNORMAL LOW (ref 3.5–5.1)
Sodium: 134 mmol/L — ABNORMAL LOW (ref 135–145)

## 2019-03-23 MED ORDER — OXYCODONE HCL 5 MG PO TABS
5.0000 mg | ORAL_TABLET | ORAL | Status: DC | PRN
  Administered 2019-03-23 (×2): 10 mg via ORAL
  Filled 2019-03-23 (×2): qty 2

## 2019-03-23 MED ORDER — HYDROMORPHONE HCL 1 MG/ML IJ SOLN
0.5000 mg | INTRAMUSCULAR | Status: DC | PRN
  Administered 2019-03-23 – 2019-03-26 (×6): 1 mg via INTRAVENOUS
  Filled 2019-03-23 (×6): qty 1

## 2019-03-23 MED ORDER — BOOST / RESOURCE BREEZE PO LIQD CUSTOM
1.0000 | Freq: Two times a day (BID) | ORAL | Status: DC
  Administered 2019-03-23 – 2019-03-26 (×7): 1 via ORAL

## 2019-03-23 MED ORDER — POTASSIUM CHLORIDE CRYS ER 20 MEQ PO TBCR
40.0000 meq | EXTENDED_RELEASE_TABLET | Freq: Two times a day (BID) | ORAL | Status: AC
  Administered 2019-03-23 – 2019-03-24 (×4): 40 meq via ORAL
  Filled 2019-03-23 (×2): qty 2
  Filled 2019-03-23: qty 4
  Filled 2019-03-23: qty 2

## 2019-03-23 NOTE — Progress Notes (Signed)
Physical Therapy Treatment Patient Details Name: Gerald Adkins MRN: [id number] DOB: 07-Nov-1956 Today's Date: 03/23/2019    History of Present Illness Pt is a 63 year old male s/p small bowel resection/right colectomy secondary to metastatic melanoma on 03/17/19 with hx of CVA, opiate addiction, lumbar spine surgery    PT Comments    Assisted OOB to bathroom.  Pt sat in bathroom for several mins due to ABD/gas pains.  Mostly gas expelled.  Assisted with amb in hallway a decreased distance due to pain.    Follow Up Recommendations  No PT follow up     Equipment Recommendations  None recommended by PT    Recommendations for Other Services       Precautions / Restrictions Precautions Precautions: Fall Restrictions Weight Bearing Restrictions: No    Mobility  Bed Mobility Overal bed mobility: Needs Assistance Bed Mobility: Supine to Sit     Supine to sit: Supervision;Min guard     General bed mobility comments: increased time and use of rail  Transfers Overall transfer level: Needs assistance Equipment used: Rolling walker (2 wheeled) Transfers: Sit to/from [employer] Sit to Stand: Supervision;Min guard Stand pivot transfers: Supervision;Min guard       General transfer comment: min/guard for safety IV line otherwise good safety cognition  Ambulation/Gait Ambulation/Gait assistance: Supervision;Min guard Gait Distance (Feet): 200 Feet Assistive device: Rolling walker (2 wheeled) Gait Pattern/deviations: Step-through pattern;Decreased stride length;Trunk flexed Gait velocity: decreased    General Gait Details: decreased amb distance due to increased c/o ABD "gas" pains.     Stairs             Wheelchair Mobility    Modified Rankin (Stroke Patients Only)       Balance                                            Cognition Arousal/Alertness: Awake/alert Behavior During Therapy: WFL for tasks  assessed/performed Overall Cognitive Status: Within Functional Limits for tasks assessed                                        Exercises      General Comments        Pertinent Vitals/Pain Pain Assessment: Faces Faces Pain Scale: Hurts even more Pain Location: "Gas Pains" that move and come and go Pain Descriptors / Indicators: Tender;Sore;Moaning Pain Intervention(s): Monitored during session;Repositioned;Premedicated before session    Home Living                      Prior Function            PT Goals (current goals can now be found in the care plan section) Progress towards PT goals: Progressing toward goals    Frequency    Min 3X/week      PT Plan Current plan remains appropriate    Co-evaluation              AM-PAC PT "6 Clicks" Mobility   Outcome Measure  Help needed turning from your back to your side while in a flat bed without using bedrails?: A Little Help needed moving from lying on your back to sitting on the side of a flat bed without using bedrails?: A Little Help needed moving  to and from a bed to a chair (including a wheelchair)?: A Little Help needed standing up from a chair using your arms (e.g., wheelchair or bedside chair)?: A Little Help needed to walk in hospital room?: A Little Help needed climbing 3-5 steps with a railing? : A Little 6 Click Score: 18    End of Session Equipment Utilized During Treatment: Gait belt Activity Tolerance: Patient limited by pain Patient left: in chair;with call bell/phone within reach   PT Visit Diagnosis: Difficulty in walking, not elsewhere classified (R26.2)     Time: 1135-1200 PT Time Calculation (min) (ACUTE ONLY): 25 min  Charges:  $Gait Training: 8-22 mins $Therapeutic Activity: 8-22 mins                     Rica Koyanagi  PTA Acute  Rehabilitation Services Pager      [phone] Office      [phone]

## 2019-03-23 NOTE — Progress Notes (Signed)
   03/22/19 2356  MEWS Score  Resp 18  Pulse Rate (!) 113  BP 91/63  Temp 98.5 F (36.9 C)  SpO2 99 %  O2 Device Nasal Cannula  O2 Flow Rate (L/min) 1 L/min  MEWS Score  MEWS RR 0  MEWS Pulse 2  MEWS Systolic 1  MEWS LOC 0  MEWS Temp 0  MEWS Score 3  MEWS Score Color Yellow   This is not an acute change. RN will continue to monitor vital signs q4h and will notify physician of an acute change.

## 2019-03-23 NOTE — Progress Notes (Signed)
[facility] Progress Note  6 Days Post-Op  Subjective: CC-  Sitting up eating breakfast. Tolerating clear liquids. Denies n/v. Passed a lot of flatus yesterday and had 3 BMs. Did not feel like ambulating yesterday, but plans to get OOB and walk in the halls today.  Objective: Vital signs in last 24 hours: Temp:  [98 F (36.7 C)-98.9 F (37.2 C)] 98 F (36.7 C) (04/22 0509) Pulse Rate:  [105-124] 105 (04/22 0509) Resp:  [18] 18 (04/22 0509) BP: (90-110)/(63-78) 110/78 (04/22 0509) SpO2:  [95 %-100 %] 99 % (04/22 0509) Last BM Date: 03/22/19  Intake/Output from previous day: 04/21 0701 - 04/22 0700 In: 1428.8 [P.O.:600; I.V.:678.8; IV Piggyback:150] Out: 500 [Urine:500] Intake/Output this shift: No intake/output data recorded.  PE: Gen:  Alert, NAD HEENT: EOM's intact, pupils equal and round Card:  RRR Pulm:  CTAB, no W/R/R, effort normal Abd: Soft, mild distension, + BS, midline incision cdi with staples intact, nontender Skin: warm and dry   Lab Results:  Recent Labs    03/21/19 0330 03/22/19 0426  WBC 9.0 6.7  HGB 8.2* 7.5*  HCT 31.4* 28.0*  PLT 469* 352   BMET Recent Labs    03/22/19 0426 03/23/19 0425  NA 133* 134*  K 3.3* 3.2*  CL 102 102  CO2 23 26  GLUCOSE 74 96  BUN 7 6  CREATININE 0.46* 0.42*  CALCIUM 7.3* 7.5*   PT/INR No results for input(s): LABPROT, INR in the last 72 hours. CMP     Component Value Date/Time   NA 134 (L) 03/23/2019 0425   K 3.2 (L) 03/23/2019 0425   CL 102 03/23/2019 0425   CO2 26 03/23/2019 0425   GLUCOSE 96 03/23/2019 0425   BUN 6 03/23/2019 0425   CREATININE 0.42 (L) 03/23/2019 0425   CREATININE 0.59 (L) 09/29/2018 1427   CALCIUM 7.5 (L) 03/23/2019 0425   PROT 7.0 03/16/2019 1512   ALBUMIN 3.0 (L) 03/16/2019 1512   AST 14 (L) 03/16/2019 1512   AST 11 (L) 09/29/2018 1427   ALT 8 03/16/2019 1512   ALT 9 09/29/2018 1427   ALKPHOS 80 03/16/2019 1512   BILITOT 0.4 03/16/2019 1512   BILITOT 0.2  (L) 09/29/2018 1427   GFRNONAA >60 03/23/2019 0425   GFRNONAA >60 09/29/2018 1427   GFRAA >60 03/23/2019 0425   GFRAA >60 09/29/2018 1427   Lipase     Component Value Date/Time   LIPASE 22 03/16/2019 1512       Studies/Results: No results found.  Anti-infectives: Anti-infectives (From admission, onward)   Start     Dose/Rate Route Frequency Ordered Stop   03/16/19 2200  piperacillin-tazobactam (ZOSYN) IVPB 3.375 g  Status:  Discontinued     3.375 g 12.5 mL/hr over 240 Minutes Intravenous Every 8 hours 03/16/19 2116 03/18/19 0919   03/16/19 1730  piperacillin-tazobactam (ZOSYN) IVPB 3.375 g     3.375 g 100 mL/hr over 30 Minutes Intravenous  Once 03/16/19 1718 03/16/19 1805       Assessment/Plan H/o CVA HTN HLD H/o metastatic melanoma - followed by Dr. Mckinley Jewel  SBO S/p Right hemicolectomy, small bowel resection 4/16 Dr. Donne Hazel - History of metastatic melanoma, small bowel obstruction x2 with what appears to be intussusception secondary to metastatic melanoma - POD 6 - surgical path: METASTATIC MALIGNANT MELANOMA, MULTIPLE FOCI; LYMPHOVASCULAR INVASION IS IDENTIFIED; METASTATIC MELANOMA IN 1 OF 21 LYMPH NODES (1/21).  >> message sent to Dr. Julien Nordmann by Dr. Donne Hazel regarding path - NG tube  out 4/21, tolerating clears and having bowel function  ABL anemia- chronic before surgery, s/p 1 unit 4/17. Hgb 8.2 from 7.1, stable  ID - zosyn 4/15>>4/17 FEN - decrease IVF, FLD, Boost VTE - SCDs, lovenox Foley - none Follow up - Dr. Donne Hazel, oncology  Plan - Advance to full liquids and add Boost, soft diet for dinner if tolerating fulls. Ambulate. Add PO pain medication. Replace potassium. Likely ready for discharge tomorrow. I will call patient's son Gerald Adkins [phone]) later today.   LOS: 7 days    Wellington Hampshire , [facility] 03/23/2019, 8:09 AM Pager: [phone] Mon-Thurs 7:00 am-4:30 pm Fri 7:00 am -11:30 AM Sat-Sun 7:00 am-11:30  am

## 2019-03-24 ENCOUNTER — Inpatient Hospital Stay (HOSPITAL_COMMUNITY): Payer: Medicare Other

## 2019-03-24 LAB — CBC
HCT: 31.7 % — ABNORMAL LOW (ref 39.0–52.0)
Hemoglobin: 8.4 g/dL — ABNORMAL LOW (ref 13.0–17.0)
MCH: 19.5 pg — ABNORMAL LOW (ref 26.0–34.0)
MCHC: 26.5 g/dL — ABNORMAL LOW (ref 30.0–36.0)
MCV: 73.5 fL — ABNORMAL LOW (ref 80.0–100.0)
Platelets: 390 10*3/uL (ref 150–400)
RBC: 4.31 MIL/uL (ref 4.22–5.81)
RDW: 25.2 % — ABNORMAL HIGH (ref 11.5–15.5)
WBC: 9.1 10*3/uL (ref 4.0–10.5)
nRBC: 0 % (ref 0.0–0.2)

## 2019-03-24 LAB — BASIC METABOLIC PANEL
Anion gap: 10 (ref 5–15)
BUN: 6 mg/dL (ref 6–20)
CO2: 24 mmol/L (ref 22–32)
Calcium: 8.2 mg/dL — ABNORMAL LOW (ref 8.9–10.3)
Chloride: 103 mmol/L (ref 98–111)
Creatinine, Ser: 0.54 mg/dL — ABNORMAL LOW (ref 0.61–1.24)
GFR calc Af Amer: >60 mL/min (ref >60)
GFR calc non Af Amer: >60 mL/min (ref >60)
Glucose, Bld: 119 mg/dL — ABNORMAL HIGH (ref 70–99)
Potassium: 3.6 mmol/L (ref 3.5–5.1)
Sodium: 137 mmol/L (ref 135–145)

## 2019-03-24 LAB — CREATININE, SERUM
Creatinine, Ser: 0.52 mg/dL — ABNORMAL LOW (ref 0.61–1.24)
GFR calc Af Amer: >60 mL/min (ref >60)
GFR calc non Af Amer: >60 mL/min (ref >60)

## 2019-03-24 MED ORDER — KCL IN DEXTROSE-NACL 20-5-0.45 MEQ/L-%-% IV SOLN
INTRAVENOUS | Status: DC
  Administered 2019-03-24 – 2019-03-26 (×4): via INTRAVENOUS
  Filled 2019-03-24 (×4): qty 1000

## 2019-03-24 MED ORDER — SIMETHICONE 80 MG PO CHEW: 80.0000 mg | CHEWABLE_TABLET | Freq: Four times a day (QID) | ORAL | Status: DC | PRN

## 2019-03-24 MED ORDER — PROMETHAZINE HCL 25 MG/ML IJ SOLN: 12.5000 mg | Freq: Four times a day (QID) | INTRAMUSCULAR | Status: DC | PRN

## 2019-03-24 MED ORDER — TRAMADOL HCL 50 MG PO TABS
100.0000 mg | ORAL_TABLET | Freq: Four times a day (QID) | ORAL | Status: DC | PRN
  Administered 2019-03-24 – 2019-03-26 (×5): 100 mg via ORAL
  Filled 2019-03-24 (×5): qty 2

## 2019-03-24 MED ORDER — TRAMADOL HCL 50 MG PO TABS: 50.0000 mg | ORAL_TABLET | Freq: Four times a day (QID) | ORAL | Status: DC | PRN

## 2019-03-24 MED ORDER — POLYETHYLENE GLYCOL 3350 17 G PO PACK
17.0000 g | PACK | Freq: Once | ORAL | Status: DC
  Filled 2019-03-24: qty 1

## 2019-03-24 MED ORDER — METHOCARBAMOL 500 MG PO TABS
500.0000 mg | ORAL_TABLET | Freq: Three times a day (TID) | ORAL | Status: DC | PRN
  Administered 2019-03-26: 500 mg via ORAL
  Filled 2019-03-24: qty 1

## 2019-03-24 MED ORDER — SODIUM CHLORIDE 0.9 % IV BOLUS: 500.0000 mL | Freq: Once | INTRAVENOUS | Status: DC

## 2019-03-24 MED ORDER — ACETAMINOPHEN 325 MG PO TABS: 650.0000 mg | ORAL_TABLET | Freq: Four times a day (QID) | ORAL | Status: DC | PRN

## 2019-03-24 NOTE — Progress Notes (Addendum)
[facility] Progress Note  7 Days Post-Op  Subjective: CC-  Patient has been up vomiting this morning. States that he is passing a lot of flatus and had 3 tiny BMs since yesterday. Not eating very much. States that oxycodone is too strong and dilaudid not strong enough, wants a different pain medication.  Objective: Vital signs in last 24 hours: Temp:  [97.8 F (36.6 C)-98.4 F (36.9 C)] 98.2 F (36.8 C) (04/23 0512) Pulse Rate:  [106-114] 110 (04/23 0531) Resp:  [14-18] 18 (04/23 0512) BP: (87-106)/(69-75) 98/69 (04/23 0512) SpO2:  [93 %-100 %] 95 % (04/23 0531) Last BM Date: 03/23/19  Intake/Output from previous day: 04/22 0701 - 04/23 0700 In: 541.4 [P.O.:240; I.V.:301.4] Out: 750 [Urine:750] Intake/Output this shift: Total I/O In: 0  Out: 300 [Emesis/NG output:300]  PE: Gen: Alert, NAD HEENT: EOM's intact, pupils equal and round Card: RRR Pulm: few rhonchi, no wheezes, effort normal Abd: Soft,mild distension, mild diffuse tenderness,+BS,midline incision cdi withstaples intact Skin:warm and dry   Lab Results:  Recent Labs    03/22/19 0426  WBC 6.7  HGB 7.5*  HCT 28.0*  PLT 352   BMET Recent Labs    03/22/19 0426 03/23/19 0425 03/24/19 0417  NA 133* 134*  --   K 3.3* 3.2*  --   CL 102 102  --   CO2 23 26  --   GLUCOSE 74 96  --   BUN 7 6  --   CREATININE 0.46* 0.42* 0.52*  CALCIUM 7.3* 7.5*  --    PT/INR No results for input(s): LABPROT, INR in the last 72 hours. CMP     Component Value Date/Time   NA 134 (L) 03/23/2019 0425   K 3.2 (L) 03/23/2019 0425   CL 102 03/23/2019 0425   CO2 26 03/23/2019 0425   GLUCOSE 96 03/23/2019 0425   BUN 6 03/23/2019 0425   CREATININE 0.52 (L) 03/24/2019 0417   CREATININE 0.59 (L) 09/29/2018 1427   CALCIUM 7.5 (L) 03/23/2019 0425   PROT 7.0 03/16/2019 1512   ALBUMIN 3.0 (L) 03/16/2019 1512   AST 14 (L) 03/16/2019 1512   AST 11 (L) 09/29/2018 1427   ALT 8 03/16/2019 1512   ALT 9  09/29/2018 1427   ALKPHOS 80 03/16/2019 1512   BILITOT 0.4 03/16/2019 1512   BILITOT 0.2 (L) 09/29/2018 1427   GFRNONAA >60 03/24/2019 0417   GFRNONAA >60 09/29/2018 1427   GFRAA >60 03/24/2019 0417   GFRAA >60 09/29/2018 1427   Lipase     Component Value Date/Time   LIPASE 22 03/16/2019 1512       Studies/Results: No results found.  Anti-infectives: Anti-infectives (From admission, onward)   Start     Dose/Rate Route Frequency Ordered Stop   03/16/19 2200  piperacillin-tazobactam (ZOSYN) IVPB 3.375 g  Status:  Discontinued     3.375 g 12.5 mL/hr over 240 Minutes Intravenous Every 8 hours 03/16/19 2116 03/18/19 0919   03/16/19 1730  piperacillin-tazobactam (ZOSYN) IVPB 3.375 g     3.375 g 100 mL/hr over 30 Minutes Intravenous  Once 03/16/19 1718 03/16/19 1805       Assessment/Plan H/o CVA HTN HLD H/o metastatic melanoma - followed by Dr. Mckinley Jewel  SBO S/pRight hemicolectomy, small bowel resection4/16 Dr. Donne Hazel - History of metastatic melanoma, small bowel obstruction x2 with what appears to be intussusception secondary to metastatic melanoma - POD 7 - surgical path: METASTATIC MALIGNANT MELANOMA, MULTIPLE FOCI; LYMPHOVASCULAR INVASION IS IDENTIFIED; METASTATIC MELANOMA IN 1  OF 21 LYMPH NODES (1/21).  >> message sent to Dr. Julien Nordmann by Dr. Donne Hazel regarding path - can d/c staples prior to discharge  ABL anemia- chronic before surgery, s/p 1 unit 4/17.Hgb 8.2 (4/22), stable   ID -zosyn 4/15>>4/17 FEN - increase IVF, FLD, Boost VTE -SCDs, lovenox Foley -none Follow up- Dr. Donne Hazel, oncology Contact - sonStephen [phone])  Plan-Back diet down to full liquids and increase IVF. Check abdominal film and CBC/BMP. Add tramadol for pain. I updated patient's son.   LOS: 8 days    Wellington Hampshire , [facility] 03/24/2019, 9:12 AM Pager: [phone] Mon-Thurs 7:00 am-4:30 pm Fri 7:00 am -11:30 AM Sat-Sun 7:00 am-11:30  am

## 2019-03-24 NOTE — Progress Notes (Signed)
Patient told RN that he wanted to go home. He stated that he isn't receiving the adequate care and that there is miscommunication. He was also upset because he was back on a full liquid diet. This RN spoke with patient and said this would be good for to him to speak to the MD when they came in the morning, he was agreeable. Will continue to monitor.

## 2019-03-24 NOTE — Progress Notes (Signed)
Physical Therapy Treatment Patient Details Name: Gerald Adkins MRN: [id number] DOB: 1956/05/20 Today's Date: 03/24/2019    History of Present Illness Pt is a 63 year old male s/p small bowel resection/right colectomy secondary to metastatic melanoma on 03/17/19 with hx of CVA, opiate addiction, lumbar spine surgery    PT Comments    Assisted OOB to amb in hallway a great distance.  Assisted to bathroom due to increased ABD pain (gas pains). Assisted back to bed.  Pt progressing well.   Follow Up Recommendations  No PT follow up     Equipment Recommendations  None recommended by PT    Recommendations for Other Services       Precautions / Restrictions Precautions Precautions: Fall Restrictions Weight Bearing Restrictions: No    Mobility  Bed Mobility Overal bed mobility: Modified Independent             General bed mobility comments: increased time and use of rail  Transfers Overall transfer level: Needs assistance Equipment used: Rolling walker (2 wheeled) Transfers: Sit to/from [employer] Transfers Sit to Stand: Supervision;Min guard Stand pivot transfers: Supervision;Min guard       General transfer comment: min/guard for safety IV line otherwise good safety cognition  Ambulation/Gait Ambulation/Gait assistance: Supervision;Min guard Gait Distance (Feet): 210 Feet Assistive device: Rolling walker (2 wheeled) Gait Pattern/deviations: Step-through pattern;Decreased stride length;Trunk flexed Gait velocity: decreased    General Gait Details: tolerated well    Stairs             Wheelchair Mobility    Modified Rankin (Stroke Patients Only)       Balance                                            Cognition Arousal/Alertness: Awake/alert Behavior During Therapy: WFL for tasks assessed/performed Overall Cognitive Status: Within Functional Limits for tasks assessed                                         Exercises      General Comments        Pertinent Vitals/Pain Pain Assessment: Faces Faces Pain Scale: Hurts a little bit Pain Location: "Gas Pains" that move and come and go Pain Descriptors / Indicators: Discomfort;Grimacing Pain Intervention(s): Monitored during session;Repositioned    Home Living                      Prior Function            PT Goals (current goals can now be found in the care plan section) Progress towards PT goals: Progressing toward goals    Frequency    Min 3X/week      PT Plan Current plan remains appropriate    Co-evaluation              AM-PAC PT "6 Clicks" Mobility   Outcome Measure  Help needed turning from your back to your side while in a flat bed without using bedrails?: A Little Help needed moving from lying on your back to sitting on the side of a flat bed without using bedrails?: A Little Help needed moving to and from a bed to a chair (including a wheelchair)?: A Little Help needed standing up from a chair using  your arms (e.g., wheelchair or bedside chair)?: A Little Help needed to walk in hospital room?: A Little Help needed climbing 3-5 steps with a railing? : A Little 6 Click Score: 18    End of Session Equipment Utilized During Treatment: Gait belt Activity Tolerance: Patient tolerated treatment well Patient left: in chair;with call bell/phone within reach Nurse Communication: Mobility status PT Visit Diagnosis: Difficulty in walking, not elsewhere classified (R26.2)     Time: 1415-1440 PT Time Calculation (min) (ACUTE ONLY): 25 min  Charges:  $Gait Training: 8-22 mins $Therapeutic Activity: 8-22 mins                     Rica Koyanagi  PTA Acute  Rehabilitation Services Pager      [phone] Office      [phone]

## 2019-03-24 NOTE — Progress Notes (Addendum)
Nutrition Follow-up  RD working remotely.   DOCUMENTATION CODES:   (unable to assess for malnutrition at this time)  INTERVENTION:  - continue Boost Breeze BID, each supplement provides 250 kcal and 9 grams of protein. - continue to encourage PO intakes as tolerated. - re-advance diet as medically feasible.   NUTRITION DIAGNOSIS:   Increased nutrient needs related to acute illness, chronic illness, cancer and cancer related treatments as evidenced by estimated needs. -ongoing  GOAL:   Patient will meet greater than or equal to 90% of their needs -unmet at this time  MONITOR:   PO intake, Supplement acceptance, Diet advancement, Labs, Weight trends, Skin, I & O's  ASSESSMENT:   63 year old male with known metastatic melanoma who presented to the ED with weakness, intermittent sharp abdominal pain, episodes of bloody diarrhea, and intermittent N/V for 2 weeks. CT scan of the chest, abdomen, and pelvis showed an area in small bowel as well as in the distal ileum and descending colon there was intussuscepted. He reported that pain has been intermittent and has not changed much in the past 2 weeks. Previously, he received radiation to metastatic disease in his mediastinum and did receive 1 round of chemotherapy toward the end of last year (2019)which he did not tolerate.   No weight recorded since 4/16. NGT was placed on 4/16 and subsequently removed on 4/21. Diet advanced from NPO to CLD on 4/21 at 8:35 AM, to [place] on 4/22 at 8:10 AM, and to Soft on 4/22 at 2:00 PM. Diet was then downgraded from Soft to FLD today at 9:10 AM d/t vomiting this AM. Boost Breeze was ordered BID yesterday and patient accepted all three cartons offered to him since that time.   Per Brooke's note this AM: vomiting began this AM, small 3 BMs yesterday, passing a lot of flatus today. Patient reported not eating much d/t symptoms. Patient is now POD #7. Plan for FLD and increase in IVF and to obtain abd imaging.       Medications reviewed; 40 mg IV protonix/day, 1 packet miralax/day, 40 mEq K-Dur BID. Labs reviewed; creatinine: 0.54 mg/dl, Ca: 8.2 mg/dl. IVF; D5-1/2 NS-20 mEq IV KCl @ 75 ml/hr (306 kcal).    Diet Order:   Diet Order            Diet full liquid Room service appropriate? Yes; Fluid consistency: Thin  Diet effective now              EDUCATION NEEDS:   Not appropriate for education at this time  Skin:  Skin Assessment: Skin Integrity Issues: Skin Integrity Issues:: Incisions Incisions: abdominal (4/16)  Last BM:  4/22  Height:   Ht Readings from Last 1 Encounters:  03/17/19 6\' 1"  (1.854 m)    Weight:   Wt Readings from Last 1 Encounters:  03/17/19 74.8 kg    Ideal Body Weight:  83.64 kg  BMI:  Body mass index is 21.77 kg/m.  Estimated Nutritional Needs:   Kcal:  2245-2470 kcal  Protein:  110-125 grams  Fluid:  >/= 2.2 L/day     Jarome Matin, MS, RD, LDN, [facility] Inpatient Clinical Dietitian Pager # [phone] After hours/weekend pager # [phone]

## 2019-03-24 NOTE — Plan of Care (Signed)

## 2019-03-25 MED ORDER — DOCUSATE SODIUM 100 MG PO CAPS
100.0000 mg | ORAL_CAPSULE | Freq: Two times a day (BID) | ORAL | Status: DC
  Administered 2019-03-25 – 2019-03-26 (×3): 100 mg via ORAL
  Filled 2019-03-25 (×3): qty 1

## 2019-03-25 NOTE — Progress Notes (Addendum)
[facility] Progress Note  8 Days Post-Op  Subjective: CC-  States that he still feels really hot. TMAX 99. Abdominal pain about the same. Denies any more nausea or vomiting since yesterday morning. Tolerating full liquids. Passing flatus and had a BM this morning. Patient reports having a chronic cough, no worsening cough. Denies CP, SOB, dysuria.  Objective: Vital signs in last 24 hours: Temp:  [98 F (36.7 C)-99 F (37.2 C)] 98.5 F (36.9 C) (04/24 0602) Pulse Rate:  [108-113] 108 (04/24 0610) Resp:  [18] 18 (04/24 0610) BP: (91-106)/(66-77) 91/67 (04/24 0610) SpO2:  [92 %-97 %] 94 % (04/24 0610) Last BM Date: 03/24/19  Intake/Output from previous day: 04/23 0701 - 04/24 0700 In: 2225.3 [P.O.:1069; I.V.:1156.3] Out: 1500 [Urine:1200; Emesis/NG output:300] Intake/Output this shift: No intake/output data recorded.  PE: Gen: Alert, NAD HEENT: EOM's intact, pupils equal and round Card:  tachy Pulm: CTAB, no rhonchi or wheezes, effort normal Abd: Soft,mild distension, nontender,+BS,midline incision cdi withstaples intact and mild surrounding erythema at distal aspect of incision >> staples removed and murky old blood was evacuated Skin:warm and dry Ext: calves soft and nontender without edema  Lab Results:  Recent Labs    03/24/19 0929  WBC 9.1  HGB 8.4*  HCT 31.7*  PLT 390   BMET Recent Labs    03/23/19 0425 03/24/19 0417 03/24/19 0929  NA 134*  --  137  K 3.2*  --  3.6  CL 102  --  103  CO2 26  --  24  GLUCOSE 96  --  119*  BUN 6  --  6  CREATININE 0.42* 0.52* 0.54*  CALCIUM 7.5*  --  8.2*   PT/INR No results for input(s): LABPROT, INR in the last 72 hours. CMP     Component Value Date/Time   NA 137 03/24/2019 0929   K 3.6 03/24/2019 0929   CL 103 03/24/2019 0929   CO2 24 03/24/2019 0929   GLUCOSE 119 (H) 03/24/2019 0929   BUN 6 03/24/2019 0929   CREATININE 0.54 (L) 03/24/2019 0929   CREATININE 0.59 (L) 09/29/2018 1427   CALCIUM 8.2 (L) 03/24/2019 0929   PROT 7.0 03/16/2019 1512   ALBUMIN 3.0 (L) 03/16/2019 1512   AST 14 (L) 03/16/2019 1512   AST 11 (L) 09/29/2018 1427   ALT 8 03/16/2019 1512   ALT 9 09/29/2018 1427   ALKPHOS 80 03/16/2019 1512   BILITOT 0.4 03/16/2019 1512   BILITOT 0.2 (L) 09/29/2018 1427   GFRNONAA >60 03/24/2019 0929   GFRNONAA >60 09/29/2018 1427   GFRAA >60 03/24/2019 0929   GFRAA >60 09/29/2018 1427   Lipase     Component Value Date/Time   LIPASE 22 03/16/2019 1512       Studies/Results: Dg Abd Portable 1v  Result Date: 03/24/2019 CLINICAL DATA:  Nausea and vomiting EXAM: PORTABLE ABDOMEN - 1 VIEW COMPARISON:  03/19/2019 FINDINGS: Postsurgical changes are again noted. Multiple ingested tablets are noted within the stomach. Scattered large and small bowel gas is seen. No definitive obstructive changes seen. No bony abnormality is noted. IMPRESSION: Scattered large and small bowel gas without obstructive change. Nasogastric catheter is been removed. Electronically Signed   By: Inez Catalina M.D.   On: 03/24/2019 11:26    Anti-infectives: Anti-infectives (From admission, onward)   Start     Dose/Rate Route Frequency Ordered Stop   03/16/19 2200  piperacillin-tazobactam (ZOSYN) IVPB 3.375 g  Status:  Discontinued     3.375 g 12.5  mL/hr over 240 Minutes Intravenous Every 8 hours 03/16/19 2116 03/18/19 0919   03/16/19 1730  piperacillin-tazobactam (ZOSYN) IVPB 3.375 g     3.375 g 100 mL/hr over 30 Minutes Intravenous  Once 03/16/19 1718 03/16/19 1805       Assessment/Plan H/o CVA HTN HLD H/o metastatic melanoma - followed by Dr. Mckinley Jewel  SBO S/pRight hemicolectomy, small bowel resection4/16 Dr. Donne Hazel - History of metastatic melanoma, small bowel obstruction x2 with what appears to be intussusception secondary to metastatic melanoma - POD8 - surgical path:METASTATIC MALIGNANT MELANOMA, MULTIPLE FOCI;LYMPHOVASCULAR INVASION IS IDENTIFIED;METASTATIC  MELANOMA IN 1 OF 21 LYMPH NODES (1/21).  >> message sent to Dr. Julien Nordmann by Dr. Donne Hazel regarding path - staples removed 4/24 for local wound infection, pack distal aspect of incision BID with saline dampened gauze  ABL anemia- chronic before surgery, s/p 1 unit 4/17.Hgb 8.4 (4/23), stable   ID -zosyn 4/15>>4/17 FEN - IVF, soft diet, Boost VTE -SCDs, lovenox Foley -none Follow up- Dr. Donne Hazel, oncology Contact - sonStephen [phone])  Plan-Advance to soft diet. Incision opened at distal aspect for local wound infection, pack BID with saline dampened gauze and monitor.   LOS: 9 days    Wellington Hampshire , [facility] 03/25/2019, 8:48 AM Pager: [phone] Mon-Thurs 7:00 am-4:30 pm Fri 7:00 am -11:30 AM Sat-Sun 7:00 am-11:30 am

## 2019-03-25 NOTE — Discharge Summary (Signed)
[facility] Discharge Summary   Patient ID: Gerald Adkins MRN: [id number] DOB/AGE: 61-03-1956 61 y.o.  Admit date: 03/16/2019 Discharge date: 03/26/2019  Admitting Diagnosis: Metastatic melanoma with small and large bowel intussusception and obstruction  Discharge Diagnosis Patient Active Problem List   Diagnosis Date Noted  . SBO (small bowel obstruction) ([place]) 03/16/2019  . Encounter for antineoplastic immunotherapy 09/22/2018  . Iron deficiency anemia 09/03/2018  . Mediastinal mass   . Goals of care, counseling/discussion   . Palliative care by specialist   . Advance care planning   . Adjustment insomnia   . Lung mass 09/01/2018  . Melanoma ([place]) 09/01/2018  . Chronic back pain 09/01/2018  . Cough 09/01/2018  . Dyspnea 08/30/2018  . 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 Multiple ingested tablets are noted within the stomach. Scattered large and small bowel gas is seen. No definitive obstructive changes seen. No bony abnormality is noted. IMPRESSION: Scattered large and small bowel gas without obstructive change. Nasogastric catheter is been removed. Electronically Signed   By: Inez Catalina M.D.   On: 03/24/2019 11:26    Procedures Dr. Donne Hazel (03/17/19) - Right hemicolectomy, small bowel resection  Hospital Course:  Gerald Adkins is a 63yo male PMH metastatic melanoma who [facility]15 with weakness and 2 weeks of worsening abdominal pain, bloody diarrhea, and nausea/vomiting.  Workup included CT scan which showed a bowel obstruction likely secondary to intussusception from metastatic melanoma.  Options were discussed and patient elected to proceed with surgery. Intraoperatively the patient required right hemicolectomy and a small bowel resection.  Tolerated procedure well and was transferred to the floor. Surgical pathology confirmed metastatic melanoma with 1/21 lymph nodes positive. Patient noted to have acute blood loss on chronic anemia POD#1 and was transfused 1 unit PRBCs with appropriate rise in hemoglobin. Patient did have an ileus as expected postoperatively, but as bowel function returned his diet was slowly advanced as tolerated. Noted to have some cellulitis at distal portion of incision on 4/24 therefore staples were removed, purulent fluid drained, and the wound was packed.  Patient worked with therapies during this admission who recommended no therapy follow up when medically stable for discharge. On POD9 the patient was voiding well, tolerating diet, ambulating well, pain well controlled, vital signs stable, incisions c/d/i and felt stable for discharge home.  Patient will follow up as below and knows to call with questions or concerns.     Physical Exam: See Dr. Trevor Mace progress note from 03/26/19   Allergies as of 03/26/2019      Reactions   [place]ter[place]ot[place] Flavor Anaphylaxis, Shortness Of Breath      Medication List    TAKE these medications   BEANO PO Take 1 tablet by mouth daily as needed (gas).        Follow-up Information    Rolm Bookbinder, MD. Call on 04/08/2019.   Specialty:  General Surgery Why:  04/08/19 at 9am. Due to coronavirus we are decreasing foot traffic in office. Instead of coming to an appt a provider will call you at the above date/time. Send picture of your incision with your name and date of birth to [email or web address] Contact information: [place] [address] [phone]        [name], MD. Call.  Specialty:  Internal Medicine Why:  call to arrange post-hospitalization follow up appointment with your primary care physician Contact information: [place] [address] [phone]        Curt Bears, MD.  Schedule an appointment as soon as possible for a visit.   Specialty:  Oncology Contact information: [place] [address] [phone]           Signed: Wellington Hampshire, [facility] 03/25/2019, 1:56 PM Pager: [phone] Mon-Thurs 7:00 am-4:30 pm Fri 7:00 am -11:30 AM Sat-Sun 7:00 am-11:30 am

## 2019-03-25 NOTE — Care Management Important Message (Signed)
Important Message  Patient Details IM Letter given to the Case Manager to present to the Patient Name: LANGDON CROSSON MRN: [id number] Date of Birth: [birth year]   Medicare Important Message Given:  Yes    Kerin Salen 03/25/2019, 11:00 AMImportant Message  Patient Details  Name: JAI STEIL MRN: [id number] Date of Birth: 1956/06/24   Medicare Important Message Given:  Yes    Kerin Salen 03/25/2019, 11:00 AM

## 2019-03-26 LAB — BASIC METABOLIC PANEL
Anion gap: 6 (ref 5–15)
BUN: 6 mg/dL (ref 6–20)
CO2: 27 mmol/L (ref 22–32)
Calcium: 7.7 mg/dL — ABNORMAL LOW (ref 8.9–10.3)
Chloride: 100 mmol/L (ref 98–111)
Creatinine, Ser: 0.53 mg/dL — ABNORMAL LOW (ref 0.61–1.24)
GFR calc Af Amer: >60 mL/min (ref >60)
GFR calc non Af Amer: >60 mL/min (ref >60)
Glucose, Bld: 121 mg/dL — ABNORMAL HIGH (ref 70–99)
Potassium: 4.2 mmol/L (ref 3.5–5.1)
Sodium: 133 mmol/L — ABNORMAL LOW (ref 135–145)

## 2019-03-26 LAB — CBC
HCT: 26.4 % — ABNORMAL LOW (ref 39.0–52.0)
Hemoglobin: 7 g/dL — ABNORMAL LOW (ref 13.0–17.0)
MCH: 19.2 pg — ABNORMAL LOW (ref 26.0–34.0)
MCHC: 26.5 g/dL — ABNORMAL LOW (ref 30.0–36.0)
MCV: 72.5 fL — ABNORMAL LOW (ref 80.0–100.0)
Platelets: 330 10*3/uL (ref 150–400)
RBC: 3.64 MIL/uL — ABNORMAL LOW (ref 4.22–5.81)
RDW: 25.3 % — ABNORMAL HIGH (ref 11.5–15.5)
WBC: 7.1 10*3/uL (ref 4.0–10.5)
nRBC: 0 % (ref 0.0–0.2)

## 2019-03-26 MED ORDER — TRAMADOL HCL 50 MG PO TABS: 50.0000 mg | ORAL_TABLET | Freq: Four times a day (QID) | ORAL | 0 refills | Status: DC | PRN

## 2019-03-26 NOTE — Care Management (Addendum)
     Home health agencies that serve [phone]. Your favorite home health agencies  Home Health Agencies Search Results  Results List Table Home Health Agency Information Quality of Patient Care Rating Patient Survey Summary Rating  [facility]  [phone] 4 out of 5 stars 4 out of 5 stars  [facility]  [phone] 3 out of 5 stars 4 out of 5 stars  [facility]  [phone] 3 out of 5 stars 4 out of 5 stars  [facility]  [phone] 4  out of 5 stars 4 out of 5 stars  [facility]  [phone] 4  out of 5 stars 4 out of 5 stars  [facility]  [phone] 4 out of 5 stars 4 out of 5 stars  [facility]  [phone] 3  out of 5 stars 3 out of 5 stars  [facility]  [phone] 3 out of 5 stars 3 out of 5 stars  [facility]  [phone] 4 out of 5 stars Not Available11  [facility]  [phone] 3 out of 5 stars 2 out of 5 stars  [facility]  [phone] 3  out of 5 stars 4 out of 5 stars  [facility]  [phone] 3  out of 5 stars 4 out of 5 stars  [facility]  [phone] 5 out of 5 stars 3 out of 5 stars  [facility]  [phone] 4  out of 5 stars 2 out of 5 stars   Home Health Footnotes Footnote number Footnote as displayed on Home Health Compare  1 This agency provides services under a federal waiver program to non-traditional, chronic long term population.  2 This agency provides services to a special needs population.  3 Not Available.  4 The number of patient episodes for this measure is too small to report.  5 This measure currently does not have data or provider has been certified/recertified for less than 6 months.  6 The national average for this measure is not provided because of state-to-state differences in data collection.  7 Medicare is not displaying  rates for this measure for any home health agency, because of an issue with the data.  8 There were problems with the data and they are being corrected.  9 Zero, or very few, patients met the survey's rules for inclusion. The scores shown, if any, reflect a very small number of surveys and may not accurately tell how an agency is doing.  10 Survey results are based on less than 12 months of data.  11 Fewer than 70 patients completed the survey. Use the scores shown, if any, with caution as the number of surveys may be too low to accurately tell how an agency is doing.  12 No survey results are available for this period.  13 Data suppressed by CMS for one or more quarters.    

## 2019-03-26 NOTE — Progress Notes (Signed)
9 Days Post-Op   Subjective/Chief Complaint: Doing well Wants to go home Tolerating po Having BM's   Objective: Vital signs in last 24 hours: Temp:  [98 F (36.7 C)-98.2 F (36.8 C)] 98.2 F (36.8 C) (04/25 0557) Pulse Rate:  [100-106] 100 (04/25 0612) Resp:  [15-16] 16 (04/25 0557) BP: (94-105)/(66-72) 94/66 (04/25 0557) SpO2:  [93 %-98 %] 93 % (04/25 0557) Last BM Date: 03/25/19  Intake/Output from previous day: 04/24 0701 - 04/25 0700 In: 2344.6 [P.O.:310; I.V.:2034.6] Out: 1425 [Urine:1425] Intake/Output this shift: Total I/O In: -  Out: 350 [Urine:350]  Exam: Looks well and comfortable Abdomen soft, wound clean  Lab Results:  Recent Labs    03/24/19 0929 03/26/19 0340  WBC 9.1 7.1  HGB 8.4* 7.0*  HCT 31.7* 26.4*  PLT 390 330   BMET Recent Labs    03/24/19 0929 03/26/19 0340  NA 137 133*  K 3.6 4.2  CL 103 100  CO2 24 27  GLUCOSE 119* 121*  BUN 6 6  CREATININE 0.54* 0.53*  CALCIUM 8.2* 7.7*   PT/INR No results for input(s): LABPROT, INR in the last 72 hours. ABG No results for input(s): PHART, HCO3 in the last 72 hours.  Invalid input(s): PCO2, PO2  Studies/Results: Dg Abd Portable 1v  Result Date: 03/24/2019 CLINICAL DATA:  Nausea and vomiting EXAM: PORTABLE ABDOMEN - 1 VIEW COMPARISON:  03/19/2019 FINDINGS: Postsurgical changes are again noted. Multiple ingested tablets are noted within the stomach. Scattered large and small bowel gas is seen. No definitive obstructive changes seen. No bony abnormality is noted. IMPRESSION: Scattered large and small bowel gas without obstructive change. Nasogastric catheter is been removed. Electronically Signed   By: Inez Catalina M.D.   On: 03/24/2019 11:26    Anti-infectives: Anti-infectives (From admission, onward)   Start     Dose/Rate Route Frequency Ordered Stop   03/16/19 2200  piperacillin-tazobactam (ZOSYN) IVPB 3.375 g  Status:  Discontinued     3.375 g 12.5 mL/hr over 240 Minutes Intravenous  Every 8 hours 03/16/19 2116 03/18/19 0919   03/16/19 1730  piperacillin-tazobactam (ZOSYN) IVPB 3.375 g     3.375 g 100 mL/hr over 30 Minutes Intravenous  Once 03/16/19 1718 03/16/19 1805      Assessment/Plan: s/p Procedure(s): EXPLORATORY LAPAROTOMY SMALL BOWEL AND RIGHT COLON RESECTION (N/A)  Discharge home today Wet to dry dressing changes   LOS: 10 days    Coralie Keens 03/26/2019

## 2019-03-26 NOTE — Progress Notes (Signed)
RN reviewed discharge instructions with patient. RN also called son and reviewed medications, and dressing change with son, Remo Lipps. All questions answered.   Prescription sent to pharmacy. Son informed of prescription and location.   Patient was given dressing supplies by myself, and dressing change was thoroughly discussed with patient. Patient also instructed to shower everyday.    NT helped dress patient, and patient was taken to main entrance, and discharged safely to sons vehicle.

## 2019-03-26 NOTE — TOC Initial Note (Addendum)
Transition of Care [facility]) - Initial/Assessment Note    Patient Details  Name: Gerald Adkins MRN: [id number] Date of Birth: 12-31-1955  [facility]) CM/SW Contact:    Erenest Rasher, RN Phone Number: 03/26/2019, 3:12 PM  Clinical Narrative:                 Per Unit RN, pt and son wanted to do the dressing changes at home and dc to home prior to [facility] arranging [facility] RN. Contacted son via phone and explained [facility] RN and assisting with managing wound and dressing changes. He agreed to [facility]. Offered choice. Son agreeable to [facility] for [facility]. Contacted [facility] rep with new referral.   Expected Discharge Plan: [place] Barriers to Discharge: No Barriers Identified   Patient Goals and CMS Choice Patient states their goals for this hospitalization and ongoing recovery are:: stay well CMS Medicare.gov Compare Post Acute Care list provided to:: Patient Represenative (must comment) Choice offered to / list presented to : Adult Children  Expected Discharge Plan and Services Expected Discharge Plan: [place]   Discharge Planning Services: CM Consult Post Acute Care Choice: [place] arrangements for the past 2 months: Single Family Home Expected Discharge Date: 03/26/19                         HH Arranged: RN [place] Agency: [place] Date Donna: 03/26/19 Time HH Agency Contacted: 0630    Prior Living Arrangements/Services Living arrangements for the past 2 months: Single Family Home Lives with:: Spouse Patient language and need for interpreter reviewed:: Yes Do you feel safe going back to the place where you live?: Yes      Need for Family Participation in Patient Care: Yes (Comment) Care giver support system in place?: Yes (comment) Current home services: (cane) Criminal Activity/Legal Involvement Pertinent to Current Situation/Hospitalization: No - Comment as needed  Activities of Daily Living Home  Assistive Devices/Equipment: None ADL Screening (condition at time of admission) Patient's cognitive ability adequate to safely complete daily activities?: Yes Is the patient deaf or have difficulty hearing?: No Does the patient have difficulty seeing, even when wearing glasses/contacts?: No Does the patient have difficulty concentrating, remembering, or making decisions?: No Patient able to express need for assistance with ADLs?: Yes Does the patient have difficulty dressing or bathing?: No Independently performs ADLs?: Yes (appropriate for developmental age) Does the patient have difficulty walking or climbing stairs?: No Weakness of Legs: Both Weakness of Arms/Hands: Both  Permission Sought/Granted Permission sought to share information with : Case Manager, PCP Permission granted to share information with : Yes, Verbal Permission Granted  Share Information with NAME: Keagan Brislin  Permission granted to share info w AGENCY: [facility]   Permission granted to share info w Relationship: son     Emotional Assessment           Psych Involvement: No (comment)  Admission diagnosis:  Intussusception ([place]) [K56.1] Small bowel obstruction ([place]) [K56.609] Metastatic melanoma ([place]) [C79.9] Patient Active Problem List   Diagnosis Date Noted  . SBO (small bowel obstruction) ([place]) 03/16/2019  . Encounter for antineoplastic immunotherapy 09/22/2018  . Iron deficiency anemia 09/03/2018  . Mediastinal mass   . Goals of care, counseling/discussion   . Palliative care by specialist   . Advance care planning   . Adjustment insomnia   . Lung mass 09/01/2018  . Melanoma ([place]) 09/01/2018  .  Chronic back pain 09/01/2018  . Cough 09/01/2018  . Dyspnea 08/30/2018  . 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

## 2019-03-27 ENCOUNTER — Inpatient Hospital Stay (HOSPITAL_COMMUNITY)
Admission: EM | Admit: 2019-03-27 | Discharge: 2019-03-31 | DRG: 389 | Disposition: A | Discharge status: Home / Self Care | Payer: Medicare Other | Attending: Surgery | Admitting: Surgery

## 2019-03-27 ENCOUNTER — Emergency Department (HOSPITAL_COMMUNITY): Payer: Medicare Other

## 2019-03-27 ENCOUNTER — Other Ambulatory Visit: Payer: Self-pay

## 2019-03-27 ENCOUNTER — Encounter (HOSPITAL_COMMUNITY): Payer: Self-pay

## 2019-03-27 DIAGNOSIS — I10 Essential (primary) hypertension: Secondary | ICD-10-CM | POA: Diagnosis present

## 2019-03-27 DIAGNOSIS — Z8673 Personal history of transient ischemic attack (TIA), and cerebral infarction without residual deficits: Secondary | ICD-10-CM

## 2019-03-27 DIAGNOSIS — Z87891 Personal history of nicotine dependence: Secondary | ICD-10-CM

## 2019-03-27 DIAGNOSIS — K9189 Other postprocedural complications and disorders of digestive system: Secondary | ICD-10-CM

## 2019-03-27 DIAGNOSIS — Y838 Other surgical procedures as the cause of abnormal reaction of the patient, or of later complication, without mention of misadventure at the time of the procedure: Secondary | ICD-10-CM | POA: Diagnosis present

## 2019-03-27 DIAGNOSIS — C439 Malignant melanoma of skin, unspecified: Secondary | ICD-10-CM | POA: Diagnosis present

## 2019-03-27 DIAGNOSIS — C784 Secondary malignant neoplasm of small intestine: Secondary | ICD-10-CM | POA: Diagnosis present

## 2019-03-27 DIAGNOSIS — C787 Secondary malignant neoplasm of liver and intrahepatic bile duct: Secondary | ICD-10-CM | POA: Diagnosis present

## 2019-03-27 DIAGNOSIS — D509 Iron deficiency anemia, unspecified: Secondary | ICD-10-CM | POA: Diagnosis present

## 2019-03-27 DIAGNOSIS — D638 Anemia in other chronic diseases classified elsewhere: Secondary | ICD-10-CM

## 2019-03-27 DIAGNOSIS — Z801 Family history of malignant neoplasm of trachea, bronchus and lung: Secondary | ICD-10-CM

## 2019-03-27 DIAGNOSIS — M549 Dorsalgia, unspecified: Secondary | ICD-10-CM

## 2019-03-27 DIAGNOSIS — D62 Acute posthemorrhagic anemia: Secondary | ICD-10-CM

## 2019-03-27 DIAGNOSIS — Z803 Family history of malignant neoplasm of breast: Secondary | ICD-10-CM

## 2019-03-27 DIAGNOSIS — R000 Tachycardia, unspecified: Secondary | ICD-10-CM

## 2019-03-27 DIAGNOSIS — F1290 Cannabis use, unspecified, uncomplicated: Secondary | ICD-10-CM

## 2019-03-27 DIAGNOSIS — M48061 Spinal stenosis, lumbar region without neurogenic claudication: Secondary | ICD-10-CM | POA: Diagnosis present

## 2019-03-27 DIAGNOSIS — K6389 Other specified diseases of intestine: Secondary | ICD-10-CM

## 2019-03-27 DIAGNOSIS — J9859 Other diseases of mediastinum, not elsewhere classified: Secondary | ICD-10-CM | POA: Diagnosis present

## 2019-03-27 DIAGNOSIS — R338 Other retention of urine: Secondary | ICD-10-CM | POA: Diagnosis present

## 2019-03-27 DIAGNOSIS — K0889 Other specified disorders of teeth and supporting structures: Secondary | ICD-10-CM | POA: Diagnosis present

## 2019-03-27 DIAGNOSIS — C785 Secondary malignant neoplasm of large intestine and rectum: Secondary | ICD-10-CM

## 2019-03-27 DIAGNOSIS — R52 Pain, unspecified: Secondary | ICD-10-CM

## 2019-03-27 DIAGNOSIS — M47816 Spondylosis without myelopathy or radiculopathy, lumbar region: Secondary | ICD-10-CM | POA: Diagnosis present

## 2019-03-27 DIAGNOSIS — G8929 Other chronic pain: Secondary | ICD-10-CM | POA: Diagnosis present

## 2019-03-27 DIAGNOSIS — K567 Ileus, unspecified: Principal | ICD-10-CM | POA: Diagnosis present

## 2019-03-27 DIAGNOSIS — T8131XD Disruption of external operation (surgical) wound, not elsewhere classified, subsequent encounter: Secondary | ICD-10-CM

## 2019-03-27 DIAGNOSIS — C771 Secondary and unspecified malignant neoplasm of intrathoracic lymph nodes: Secondary | ICD-10-CM | POA: Diagnosis present

## 2019-03-27 DIAGNOSIS — N401 Enlarged prostate with lower urinary tract symptoms: Secondary | ICD-10-CM | POA: Diagnosis present

## 2019-03-27 DIAGNOSIS — Z9889 Other specified postprocedural states: Secondary | ICD-10-CM

## 2019-03-27 DIAGNOSIS — C799 Secondary malignant neoplasm of unspecified site: Secondary | ICD-10-CM

## 2019-03-27 DIAGNOSIS — R1084 Generalized abdominal pain: Secondary | ICD-10-CM | POA: Diagnosis not present

## 2019-03-27 DIAGNOSIS — E876 Hypokalemia: Secondary | ICD-10-CM

## 2019-03-27 DIAGNOSIS — Z800 Family history of malignant neoplasm of digestive organs: Secondary | ICD-10-CM

## 2019-03-27 DIAGNOSIS — N5089 Other specified disorders of the male genital organs: Secondary | ICD-10-CM | POA: Diagnosis present

## 2019-03-27 DIAGNOSIS — E538 Deficiency of other specified B group vitamins: Secondary | ICD-10-CM | POA: Diagnosis present

## 2019-03-27 DIAGNOSIS — C7972 Secondary malignant neoplasm of left adrenal gland: Secondary | ICD-10-CM | POA: Diagnosis present

## 2019-03-27 DIAGNOSIS — E785 Hyperlipidemia, unspecified: Secondary | ICD-10-CM | POA: Diagnosis present

## 2019-03-27 DIAGNOSIS — Z9049 Acquired absence of other specified parts of digestive tract: Secondary | ICD-10-CM

## 2019-03-27 DIAGNOSIS — R109 Unspecified abdominal pain: Secondary | ICD-10-CM

## 2019-03-27 DIAGNOSIS — D630 Anemia in neoplastic disease: Secondary | ICD-10-CM | POA: Diagnosis present

## 2019-03-27 HISTORY — DX: Malignant (primary) neoplasm, unspecified: C80.1

## 2019-03-27 LAB — COMPREHENSIVE METABOLIC PANEL
ALT: 12 U/L (ref 0–44)
AST: 15 U/L (ref 15–41)
Albumin: 3 g/dL — ABNORMAL LOW (ref 3.5–5.0)
Alkaline Phosphatase: 95 U/L (ref 38–126)
Anion gap: 12 (ref 5–15)
BUN: 9 mg/dL (ref 6–20)
CO2: 28 mmol/L (ref 22–32)
Calcium: 8.6 mg/dL — ABNORMAL LOW (ref 8.9–10.3)
Chloride: 95 mmol/L — ABNORMAL LOW (ref 98–111)
Creatinine, Ser: 0.62 mg/dL (ref 0.61–1.24)
GFR calc Af Amer: >60 mL/min (ref >60)
GFR calc non Af Amer: >60 mL/min (ref >60)
Glucose, Bld: 116 mg/dL — ABNORMAL HIGH (ref 70–99)
Potassium: 3 mmol/L — ABNORMAL LOW (ref 3.5–5.1)
Sodium: 135 mmol/L (ref 135–145)
Total Bilirubin: 0.7 mg/dL (ref 0.3–1.2)
Total Protein: 7.1 g/dL (ref 6.5–8.1)

## 2019-03-27 LAB — CBC
HCT: 29.5 % — ABNORMAL LOW (ref 39.0–52.0)
Hemoglobin: 8.2 g/dL — ABNORMAL LOW (ref 13.0–17.0)
MCH: 19.7 pg — ABNORMAL LOW (ref 26.0–34.0)
MCHC: 27.8 g/dL — ABNORMAL LOW (ref 30.0–36.0)
MCV: 70.9 fL — ABNORMAL LOW (ref 80.0–100.0)
Platelets: 387 10*3/uL (ref 150–400)
RBC: 4.16 MIL/uL — ABNORMAL LOW (ref 4.22–5.81)
RDW: 25.8 % — ABNORMAL HIGH (ref 11.5–15.5)
WBC: 10 10*3/uL (ref 4.0–10.5)
nRBC: 0 % (ref 0.0–0.2)

## 2019-03-27 LAB — LIPASE, BLOOD: Lipase: 43 U/L (ref 11–51)

## 2019-03-27 MED ORDER — LACTATED RINGERS IV BOLUS
1000.0000 mL | Freq: Once | INTRAVENOUS | Status: AC
  Administered 2019-03-27: 21:00:00 1000 mL via INTRAVENOUS

## 2019-03-27 MED ORDER — SODIUM CHLORIDE 0.9% FLUSH
3.0000 mL | Freq: Once | INTRAVENOUS | Status: AC
  Administered 2019-03-27: 3 mL via INTRAVENOUS

## 2019-03-27 MED ORDER — IOHEXOL 300 MG/ML  SOLN
100.0000 mL | Freq: Once | INTRAMUSCULAR | Status: AC | PRN
  Administered 2019-03-27: 100 mL via INTRAVENOUS

## 2019-03-27 MED ORDER — LACTATED RINGERS IV BOLUS
500.0000 mL | Freq: Once | INTRAVENOUS | Status: AC
  Administered 2019-03-27: 500 mL via INTRAVENOUS

## 2019-03-27 MED ORDER — MORPHINE SULFATE (PF) 4 MG/ML IV SOLN
6.0000 mg | Freq: Once | INTRAVENOUS | Status: AC
  Administered 2019-03-27: 6 mg via INTRAVENOUS
  Filled 2019-03-27: qty 2

## 2019-03-27 MED ORDER — FENTANYL CITRATE (PF) 100 MCG/2ML IJ SOLN
50.0000 ug | Freq: Once | INTRAMUSCULAR | Status: AC
  Administered 2019-03-27: 50 ug via INTRAVENOUS
  Filled 2019-03-27: qty 2

## 2019-03-27 MED ORDER — POTASSIUM CHLORIDE 10 MEQ/100ML IV SOLN
10.0000 meq | INTRAVENOUS | Status: AC
  Administered 2019-03-27 – 2019-03-28 (×3): 10 meq via INTRAVENOUS
  Filled 2019-03-27 (×3): qty 100

## 2019-03-27 MED ORDER — SODIUM CHLORIDE (PF) 0.9 % IJ SOLN
INTRAMUSCULAR | Status: AC
  Filled 2019-03-27: qty 50

## 2019-03-27 NOTE — ED Triage Notes (Signed)
States abdominal pain since cancer surgery last week states fell about 1730pm today and now severe abdominal pain voiced.

## 2019-03-27 NOTE — ED Provider Notes (Signed)
[facility] DEPT Provider Note   CSN: [id number] Arrival date & time: 03/27/19  4174    History   Chief Complaint Chief Complaint  Patient presents with   Abdominal Pain    HPI Gerald Adkins is a 63 y.o. male.     The history is provided by the patient.  Abdominal Pain  Pain location:  Generalized Pain quality: aching and dull   Pain radiates to:  Does not radiate Pain severity:  Severe Onset quality:  Sudden Timing:  Constant Progression:  Unchanged Chronicity:  New Context comment:  Patient with abdominal surgery for small bowel obstruction, tripped over rug today and landed on his knee but severe abdominal pain afterwards and low back pain. Patient was discharged yesterday. Hx of melanoma. 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   Stroke [facility])     Patient Active Problem List   Diagnosis Date Noted   SBO (small bowel obstruction) ([place]) 03/16/2019   Encounter for antineoplastic immunotherapy 09/22/2018   Iron deficiency anemia 09/03/2018   Mediastinal mass    Goals of care, counseling/discussion    Palliative care by specialist    Advance care planning    Adjustment insomnia    Lung mass 09/01/2018   Melanoma ([place]) 09/01/2018   Chronic back pain 09/01/2018   Cough 09/01/2018   Dyspnea 08/30/2018   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    Past Surgical History:  Procedure Laterality Date   APPENDECTOMY     BACK SURGERY     lumbar spine, cut sciatic nerve   CHOLECYSTECTOMY     LAPAROTOMY N/A 03/17/2019   Procedure: EXPLORATORY LAPAROTOMY SMALL  BOWEL AND RIGHT COLON RESECTION;  Surgeon: Rolm Bookbinder, MD;  Location: [facility];  Service: General;  Laterality: N/A;   SCALENE NODE BIOPSY Right 08/23/2018   Procedure: BIOPSY OF RIGHT SCALENE LYMPH NODE;  Surgeon: Grace Isaac, MD;  Location: [place] [facility];  Service: Thoracic;  Laterality: Right;        Home Medications    Prior to Admission medications   Medication Sig Start Date End Date Taking? Authorizing Provider  Alpha-D-Galactosidase (BEANO PO) Take 1 tablet by mouth daily as needed (gas).    [provider]  Aspirin-Salicylamide-Caffeine (BC HEADACHE POWDER PO) Take 1 packet by mouth 3 (three) times daily as needed (headache, pain).    [provider]  traMADol (ULTRAM) 50 MG tablet Take 1 tablet (50 mg total) by mouth every 6 (six) hours as needed for moderate pain or severe pain. 03/26/19   Coralie Keens, MD    Family History Family History  Problem Relation Age of Onset   Breast cancer Mother        multiple types   Lung cancer Maternal Aunt        nonsmoking   Pancreatic cancer Maternal Aunt     Social History Social History   Tobacco Use   Smoking status: Former Smoker    Packs/day: 1.50    Years: 33.00    Pack years: 49.50    Last attempt  to quit: 1999    Years since quitting: 21.3   Smokeless tobacco: Never Used  Substance Use Topics   Alcohol use: Yes   Drug use: Yes    Frequency: 3.0 times per week    Types: Marijuana     Allergies   Butterscotch flavor   Review of Systems Review of Systems  Constitutional: Negative for chills and fever.  HENT: Negative for ear pain and sore throat.   Eyes: Negative for pain and visual disturbance.  Respiratory: Negative for cough and shortness of breath.   Cardiovascular: Negative for chest pain and palpitations.  Gastrointestinal: Positive for abdominal pain. Negative for anorexia and vomiting.  Genitourinary: Negative for dysuria and hematuria.  Musculoskeletal: Positive  for back pain. Negative for arthralgias.  Skin: Negative for color change and rash.  Neurological: Negative for seizures and syncope.  All other systems reviewed and are negative.    Physical Exam Updated Vital Signs BP 113/77    Pulse (!) 111    Temp 98.2 F (36.8 C) (Oral)    Resp (!) 22    Ht 6\' 1"  (1.854 m)    Wt 77.1 kg    SpO2 95%    BMI 22.43 kg/m   Physical Exam Vitals signs and nursing note reviewed.  Constitutional:      General: He is not in acute distress.    Appearance: He is well-developed. He is not ill-appearing.  HENT:     Head: Normocephalic and atraumatic.     Mouth/Throat:     Mouth: Mucous membranes are moist.  Eyes:     Extraocular Movements: Extraocular movements intact.     Conjunctiva/sclera: Conjunctivae normal.  Neck:     Musculoskeletal: Neck supple.  Cardiovascular:     Rate and Rhythm: Regular rhythm. Tachycardia present.     Heart sounds: Normal heart sounds. No murmur.  Pulmonary:     Effort: Pulmonary effort is normal. No respiratory distress.     Breath sounds: Normal breath sounds.  Abdominal:     Palpations: Abdomen is soft.     Tenderness: There is generalized abdominal tenderness. There is guarding.  Skin:    General: Skin is warm and dry.     Capillary Refill: Capillary refill takes less than 2 seconds.     Comments: Well healing surgical scar with one part with dehiscence (chronic)   Neurological:     General: No focal deficit present.     Mental Status: He is alert.  Psychiatric:        Mood and Affect: Mood normal.      ED Treatments / Results  Labs (all labs ordered are listed, but only abnormal results are displayed) Labs Reviewed  COMPREHENSIVE METABOLIC PANEL - Abnormal; Notable for the following components:      Result Value   Potassium 3.0 (*)    Chloride 95 (*)    Glucose, Bld 116 (*)    Calcium 8.6 (*)    Albumin 3.0 (*)    All other components within normal limits  CBC - Abnormal; Notable for the following  components:   RBC 4.16 (*)    Hemoglobin 8.2 (*)    HCT 29.5 (*)    MCV 70.9 (*)    MCH 19.7 (*)    MCHC 27.8 (*)    RDW 25.8 (*)    All other components within normal limits  LIPASE, BLOOD  URINALYSIS, ROUTINE W REFLEX MICROSCOPIC    EKG None  Radiology Ct Abdomen Pelvis  W Contrast  Result Date: 03/27/2019 CLINICAL DATA:  Initial evaluation for acute abdominal and back pain. EXAM: CT ABDOMEN AND PELVIS WITHOUT CONTRAST TECHNIQUE: Multidetector CT imaging of the abdomen and pelvis was performed following the standard protocol without IV contrast. COMPARISON:  Comparison made with prior CT from 03/16/2019. FINDINGS: CT ABDOMEN AND PELVIS Lower chest: Small layering right pleural effusion with associated atelectasis. Probable postradiation changes partially visualize within the right perihilar region, grossly similar. 2.8 cm pleural base nodule at the lingula is increased in size (series 6, image 21). Additional 15 mm left lower lobe nodule also increased in size (series 6, image 25). 12 mm node noted near the AE recess, similar. Right cardiophrenic implant measures up to 2.6 cm, perhaps slightly increased in size from previous. Trace pericardial effusion noted. Hepatobiliary: 2.3 cm hypodense lesion at the right hepatic dome noted, stable, concerning for metastatic disease. Additional 15 mm lesion slightly inferiorly within the right hepatic lobe also similar. Additional subcentimeter hypodensity within the right hepatic lobe too small the characterize and could reflect a small cyst versus metastatic implant, stable from previous. Gallbladder surgically absent. Mild scattered intrahepatic biliary dilatation similar to previous. Pancreas: Pancreas within normal limits. Spleen: Spleen within normal limits. Adrenals/Urinary Tract: Right adrenal gland is normal. 19 mm left adrenal nodule noted, stable. Kidneys equal in size with symmetric enhancement. No nephrolithiasis, hydronephrosis or focal  enhancing renal mass. No hydroureter. Bladder within normal limits. Stomach/Bowel: Stomach largely decompressed without acute finding. Postoperative changes from interval right hemicolectomy with partial small bowel resection and ileocolonic anastomosis. Small bowel is diffusely dilated and fluid-filled to the level of the surgical anastomosis within the right lower quadrant, measuring up to 5 cm in maximal diameter. Few scattered internal air-fluid levels. Mild gaseous distension of the colon. Imaging findings favored to reflect postoperative ileus. Few scattered colonic diverticula noted without evidence for acute diverticulitis. Small to moderate volume free fluid present within the abdomen and pelvis, measuring simple fluid density. Additionally, scattered small volume free air seen within the abdomen, somewhat greater than expected for approximately 10 days post surgery. Findings raise the possibility for an ongoing leak, source not identified. No discrete loculated collections or intra-abdominal abscess. Vascular/Lymphatic: Moderate to advanced aorto bi-iliac atherosclerotic disease. Infrarenal aorta ectatic measuring up to 2.8 cm in transverse diameter. Mesenteric vessels patent proximally. Enlarged porta hepatis lymph nodes again seen, measuring up to 2.4 cm in maximal diameter, perhaps slightly increased in size from previous. No other new adenopathy. Reproductive: Prostate normal. Other: Focal skin breakdown noted along the midline incision. No loculated collections or other complication along the surgical incision itself. Mild diffuse anasarca noted. Musculoskeletal: No acute osseous abnormality. No discrete lytic or blastic osseous lesions. CT LUMBAR SPINE: Segmentation: Standard. Lowest well-formed disc labeled the L5-S1 level. Alignment: Straightening with slight reversal of the normal lumbar lordosis. Trace retrolisthesis of L3 on L4 and L4 on L5, likely chronic and facet mediated. No malalignment.  Vertebrae: Vertebral body heights maintained without evidence for acute or chronic fracture. No discrete lytic or blastic osseous lesions. Visualized sacrum and pelvis intact. SI joints approximated and symmetric. Paraspinal and other soft tissues: Paraspinous soft tissues demonstrate no acute finding. Disc levels: T12-L1: Disc desiccation with mild diffuse disc bulge.  No stenosis. L1-2: Chronic intervertebral disc space narrowing with disc desiccation and diffuse disc bulge. Right far lateral endplate osteophytic spurring. No significant stenosis. L2-3: Diffuse disc bulge with intervertebral disc space narrowing. Prominent reactive endplate changes with endplate osteophytic spurring. Mild disc  desiccation. Mild bilateral facet hypertrophy. Resultant mild spinal stenosis. Mild to moderate right L2 foraminal narrowing. L3-4: Diffuse disc bulge with disc desiccation and intervertebral disc space narrowing. Reactive endplate changes. Mild facet hypertrophy. Resultant moderate spinal stenosis. Foramina remain patent. L4-5: Diffuse disc bulge with associated annular calcification. Moderate facet hypertrophy. Resultant severe spinal stenosis. Mild to moderate bilateral L4 foraminal narrowing. L5-S1: Chronic intervertebral disc space narrowing with diffuse disc bulge and disc desiccation. Reactive endplate changes with marginal endplate osteophytic spurring. No significant spinal stenosis. Advanced bilateral L5 foraminal narrowing. IMPRESSION: CT ABDOMEN AND PELVIS IMPRESSION 1. Postoperative changes from recent right hemicolectomy with partial small bowel resection. Diffuse small bowel dilatation to the level of the surgical anastomosis favored to reflect postoperative ileus, although an obstructive process would be difficult to exclude, and could be considered in the correct clinical setting. 2. Small moderate volume free fluid within the abdomen and pelvis with persistent small volume free intraperitoneal air. Findings  are somewhat greater than expected for normal expected postoperative changes given the patient is postoperative day 10 from prior surgery. Findings raise the possibility for an enteric leak, although source is not definitely visualized on this exam. No loculated collections or intra-abdominal abscess identified. 3. Pulmonary nodules at the left lung base as above, slightly increased in size, suggesting mildly progressed disease. Scattered mediastinal and upper abdominal adenopathy stable to slightly increased in size as well. 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 4. Aortic atherosclerosis. CT LUMBAR SPINE IMPRESSION 1. No CT evidence for acute abnormality within the lumbar spine. 2. 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 3. 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 4. No evidence for osseous metastatic disease. Results were discussed by telephone at the time of interpretation on 03/27/2019 at 11:57 pm with Dr. Lennice Sites. Electronically Signed   By: Jeannine Boga M.D.   On: 03/27/2019 23:59   Ct L-spine No Charge  Result Date: 03/27/2019 CLINICAL DATA:  Initial evaluation for acute abdominal and back pain. EXAM: CT ABDOMEN AND PELVIS WITHOUT CONTRAST TECHNIQUE: Multidetector CT imaging of the abdomen and pelvis was performed following the standard protocol without IV contrast. COMPARISON:  Comparison made with prior CT from 03/16/2019. FINDINGS: CT ABDOMEN AND PELVIS Lower chest: Small layering right pleural effusion with associated atelectasis. Probable postradiation changes partially visualize within the right perihilar region, grossly similar. 2.8 cm pleural base nodule at the lingula is increased in size (series 6, image 21). Additional 15 mm left lower lobe nodule also increased in size (series 6, image 25). 12 mm node noted near the  AE recess, similar. Right cardiophrenic implant measures up to 2.6 cm, perhaps slightly increased in size from previous. Trace pericardial effusion noted. Hepatobiliary: 2.3 cm hypodense lesion at the right hepatic dome noted, stable, concerning for metastatic disease. Additional 15 mm lesion slightly inferiorly within the right hepatic lobe also similar. Additional subcentimeter hypodensity within the right hepatic lobe too small the characterize and could reflect a small cyst versus metastatic implant, stable from previous. Gallbladder surgically absent. Mild scattered intrahepatic biliary dilatation similar to previous. Pancreas: Pancreas within normal limits. Spleen: Spleen within normal limits. Adrenals/Urinary Tract: Right adrenal gland is normal. 19 mm left adrenal nodule noted, stable. Kidneys equal in size with symmetric enhancement. No nephrolithiasis, hydronephrosis or focal enhancing renal mass. No hydroureter. Bladder within normal limits. Stomach/Bowel: Stomach largely decompressed without acute finding. Postoperative changes from interval right hemicolectomy with  partial small bowel resection and ileocolonic anastomosis. Small bowel is diffusely dilated and fluid-filled to the level of the surgical anastomosis within the right lower quadrant, measuring up to 5 cm in maximal diameter. Few scattered internal air-fluid levels. Mild gaseous distension of the colon. Imaging findings favored to reflect postoperative ileus. Few scattered colonic diverticula noted without evidence for acute diverticulitis. Small to moderate volume free fluid present within the abdomen and pelvis, measuring simple fluid density. Additionally, scattered small volume free air seen within the abdomen, somewhat greater than expected for approximately 10 days post surgery. Findings raise the possibility for an ongoing leak, source not identified. No discrete loculated collections or intra-abdominal abscess. Vascular/Lymphatic:  Moderate to advanced aorto bi-iliac atherosclerotic disease. Infrarenal aorta ectatic measuring up to 2.8 cm in transverse diameter. Mesenteric vessels patent proximally. Enlarged porta hepatis lymph nodes again seen, measuring up to 2.4 cm in maximal diameter, perhaps slightly increased in size from previous. No other new adenopathy. Reproductive: Prostate normal. Other: Focal skin breakdown noted along the midline incision. No loculated collections or other complication along the surgical incision itself. Mild diffuse anasarca noted. Musculoskeletal: No acute osseous abnormality. No discrete lytic or blastic osseous lesions. CT LUMBAR SPINE: Segmentation: Standard. Lowest well-formed disc labeled the L5-S1 level. Alignment: Straightening with slight reversal of the normal lumbar lordosis. Trace retrolisthesis of L3 on L4 and L4 on L5, likely chronic and facet mediated. No malalignment. Vertebrae: Vertebral body heights maintained without evidence for acute or chronic fracture. No discrete lytic or blastic osseous lesions. Visualized sacrum and pelvis intact. SI joints approximated and symmetric. Paraspinal and other soft tissues: Paraspinous soft tissues demonstrate no acute finding. Disc levels: T12-L1: Disc desiccation with mild diffuse disc bulge.  No stenosis. L1-2: Chronic intervertebral disc space narrowing with disc desiccation and diffuse disc bulge. Right far lateral endplate osteophytic spurring. No significant stenosis. L2-3: Diffuse disc bulge with intervertebral disc space narrowing. Prominent reactive endplate changes with endplate osteophytic spurring. Mild disc desiccation. Mild bilateral facet hypertrophy. Resultant mild spinal stenosis. Mild to moderate right L2 foraminal narrowing. L3-4: Diffuse disc bulge with disc desiccation and intervertebral disc space narrowing. Reactive endplate changes. Mild facet hypertrophy. Resultant moderate spinal stenosis. Foramina remain patent. L4-5: Diffuse disc  bulge with associated annular calcification. Moderate facet hypertrophy. Resultant severe spinal stenosis. Mild to moderate bilateral L4 foraminal narrowing. L5-S1: Chronic intervertebral disc space narrowing with diffuse disc bulge and disc desiccation. Reactive endplate changes with marginal endplate osteophytic spurring. No significant spinal stenosis. Advanced bilateral L5 foraminal narrowing. IMPRESSION: CT ABDOMEN AND PELVIS IMPRESSION 1. Postoperative changes from recent right hemicolectomy with partial small bowel resection. Diffuse small bowel dilatation to the level of the surgical anastomosis favored to reflect postoperative ileus, although an obstructive process would be difficult to exclude, and could be considered in the correct clinical setting. 2. Small moderate volume free fluid within the abdomen and pelvis with persistent small volume free intraperitoneal air. Findings are somewhat greater than expected for normal expected postoperative changes given the patient is postoperative day 10 from prior surgery. Findings raise the possibility for an enteric leak, although source is not definitely visualized on this exam. No loculated collections or intra-abdominal abscess identified. 3. Pulmonary nodules at the left lung base as above, slightly increased in size, suggesting mildly progressed disease. Scattered mediastinal and upper abdominal adenopathy stable to slightly increased in size as well. 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 4. Aortic atherosclerosis. CT LUMBAR SPINE IMPRESSION 1. No CT evidence  for acute abnormality within the lumbar spine. 2. 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 3. 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 4. No evidence for osseous metastatic disease. Results were discussed by telephone at the time of  interpretation on 03/27/2019 at 11:57 pm with Dr. Lennice Sites. Electronically Signed   By: Jeannine Boga M.D.   On: 03/27/2019 23:59    Procedures Procedures (including critical care time)  Medications Ordered in ED Medications  potassium chloride 10 mEq in 100 mL IVPB (10 mEq Intravenous New Bag/Given 03/27/19 2328)  sodium chloride (PF) 0.9 % injection (has no administration in time range)  sodium chloride flush (NS) 0.9 % injection 3 mL (3 mLs Intravenous Given 03/27/19 2039)  morphine 4 MG/ML injection 6 mg (6 mg Intravenous Given 03/27/19 2039)  lactated ringers bolus 1,000 mL (0 mLs Intravenous Stopped 03/27/19 2135)  fentaNYL (SUBLIMAZE) injection 50 mcg (50 mcg Intravenous Given 03/27/19 2240)  iohexol (OMNIPAQUE) 300 MG/ML solution 100 mL (100 mLs Intravenous Contrast Given 03/27/19 2242)  lactated ringers bolus 500 mL (500 mLs Intravenous New Bag/Given 03/27/19 2338)     Initial Impression / Assessment and Plan / ED Course  I have reviewed the triage vital signs and the nursing notes.  Pertinent labs & imaging results that were available during my care of the patient were reviewed by me and considered in my medical decision making (see chart for details).     WILLIM TURNAGE is a 63 year old male with history of melanoma, recent resection of colon and small bowel due to small bowel obstruction and discharged yesterday presents to the ED with abdominal pain after a fall.  Patient tachycardic but otherwise vitals unremarkable.  Patient states that he was walking and tripped over the rug and landed on his knees.  Does not think he hit his abdomen but has had severe abdominal pain since.  Has chronic low back pain.  Did not lose consciousness.  Did not hit his head.  Has extreme tenderness of his abdomen on exam.  Has overall well healing surgical scar.  There is some wound dehiscence but that appears chronic.  He is doing wet-to-dry dressings on that area.  We will get labs including  CT abdomen and pelvis to look for any intra-abdominal injury given pain and recent surgery and fall. Patient given IV morphine, lactated ringer bolus.  Will re-evaluate after labs and imaging.  Patient with potassium of 3.  Given IV repletion.  Otherwise no significant leukocytosis, anemia, electrolyte abnormality, kidney injury.  Radiology called me on the phone to discuss CT scan findings of his abdomen and pelvis.  Concern for possible ileus versus new obstructive process.  Possible enteric leak from anastomosis given somewhat more free air than expected 10 days postop.  Patient does not have any nausea or vomiting.  He states that he is passing gas.  Overall patient has had improvement with IV fluids and IV pain medicine.  Talked with Dr. Ninfa Linden with general surgery on the phone and they will admit the patient for further care. Low concern for acute obstructive process given H and P. No need for any surgical intervention at this time per Dr. Ninfa Linden.  This chart was dictated using voice recognition software.  Despite best efforts to proofread,  errors can occur which can change the documentation meaning.    Final Clinical Impressions(s) / ED Diagnoses   Final diagnoses:  Pain  Hypokalemia  Abdominal pain, unspecified abdominal location    ED Discharge Orders    None       Lennice Sites, DO 03/28/19 0012

## 2019-03-27 NOTE — ED Notes (Signed)
Patient transported to CT 

## 2019-03-27 NOTE — ED Notes (Signed)
Pt attempted to use urinal

## 2019-03-28 ENCOUNTER — Other Ambulatory Visit: Payer: Self-pay

## 2019-03-28 ENCOUNTER — Observation Stay (HOSPITAL_COMMUNITY): Payer: Medicare Other

## 2019-03-28 DIAGNOSIS — K567 Ileus, unspecified: Secondary | ICD-10-CM | POA: Diagnosis present

## 2019-03-28 DIAGNOSIS — D638 Anemia in other chronic diseases classified elsewhere: Secondary | ICD-10-CM

## 2019-03-28 DIAGNOSIS — K9189 Other postprocedural complications and disorders of digestive system: Secondary | ICD-10-CM

## 2019-03-28 DIAGNOSIS — F1290 Cannabis use, unspecified, uncomplicated: Secondary | ICD-10-CM

## 2019-03-28 DIAGNOSIS — C799 Secondary malignant neoplasm of unspecified site: Secondary | ICD-10-CM | POA: Diagnosis not present

## 2019-03-28 DIAGNOSIS — D62 Acute posthemorrhagic anemia: Secondary | ICD-10-CM

## 2019-03-28 DIAGNOSIS — K6389 Other specified diseases of intestine: Secondary | ICD-10-CM

## 2019-03-28 DIAGNOSIS — C785 Secondary malignant neoplasm of large intestine and rectum: Secondary | ICD-10-CM

## 2019-03-28 LAB — URINALYSIS, ROUTINE W REFLEX MICROSCOPIC
Bilirubin Urine: NEGATIVE
Glucose, UA: NEGATIVE mg/dL
Hgb urine dipstick: NEGATIVE
Ketones, ur: NEGATIVE mg/dL
Leukocytes,Ua: NEGATIVE
Nitrite: NEGATIVE
Protein, ur: NEGATIVE mg/dL
Specific Gravity, Urine: 1.027 (ref 1.005–1.030)
pH: 5 (ref 5.0–8.0)

## 2019-03-28 LAB — BASIC METABOLIC PANEL
Anion gap: 7 (ref 5–15)
BUN: 8 mg/dL (ref 6–20)
CO2: 24 mmol/L (ref 22–32)
Calcium: 7.2 mg/dL — ABNORMAL LOW (ref 8.9–10.3)
Chloride: 104 mmol/L (ref 98–111)
Creatinine, Ser: 0.42 mg/dL — ABNORMAL LOW (ref 0.61–1.24)
GFR calc Af Amer: >60 mL/min (ref >60)
GFR calc non Af Amer: >60 mL/min (ref >60)
Glucose, Bld: 82 mg/dL (ref 70–99)
Potassium: 3.4 mmol/L — ABNORMAL LOW (ref 3.5–5.1)
Sodium: 135 mmol/L (ref 135–145)

## 2019-03-28 LAB — TROPONIN I: Troponin I: <0.03 ng/mL (ref <0.03)

## 2019-03-28 LAB — IRON AND TIBC
Iron: 6 ug/dL — ABNORMAL LOW (ref 45–182)
Saturation Ratios: 3 % — ABNORMAL LOW (ref 17.9–39.5)
TIBC: 203 ug/dL — ABNORMAL LOW (ref 250–450)
UIBC: 197 ug/dL

## 2019-03-28 LAB — FERRITIN: Ferritin: 16 ng/mL — ABNORMAL LOW (ref 24–336)

## 2019-03-28 LAB — VITAMIN B12: Vitamin B-12: 278 pg/mL (ref 180–914)

## 2019-03-28 LAB — CBC
HCT: 24.2 % — ABNORMAL LOW (ref 39.0–52.0)
HCT: 25.2 % — ABNORMAL LOW (ref 39.0–52.0)
Hemoglobin: 6.7 g/dL — CL (ref 13.0–17.0)
Hemoglobin: 7 g/dL — ABNORMAL LOW (ref 13.0–17.0)
MCH: 19.8 pg — ABNORMAL LOW (ref 26.0–34.0)
MCH: 19.8 pg — ABNORMAL LOW (ref 26.0–34.0)
MCHC: 27.7 g/dL — ABNORMAL LOW (ref 30.0–36.0)
MCHC: 27.8 g/dL — ABNORMAL LOW (ref 30.0–36.0)
MCV: 71.6 fL — ABNORMAL LOW (ref 80.0–100.0)
MCV: 71.4 fL — ABNORMAL LOW (ref 80.0–100.0)
Platelets: 326 10*3/uL (ref 150–400)
Platelets: 336 10*3/uL (ref 150–400)
RBC: 3.38 MIL/uL — ABNORMAL LOW (ref 4.22–5.81)
RBC: 3.53 MIL/uL — ABNORMAL LOW (ref 4.22–5.81)
RDW: 25.6 % — ABNORMAL HIGH (ref 11.5–15.5)
RDW: 25.8 % — ABNORMAL HIGH (ref 11.5–15.5)
WBC: 5.6 10*3/uL (ref 4.0–10.5)
WBC: 6 10*3/uL (ref 4.0–10.5)
nRBC: 0 % (ref 0.0–0.2)
nRBC: 0 % (ref 0.0–0.2)

## 2019-03-28 LAB — RETICULOCYTES
Immature Retic Fract: 25 % — ABNORMAL HIGH (ref 2.3–15.9)
RBC.: 3.58 MIL/uL — ABNORMAL LOW (ref 4.22–5.81)
Retic Count, Absolute: 31.5 10*3/uL (ref 19.0–186.0)
Retic Ct Pct: 0.9 % (ref 0.4–3.1)

## 2019-03-28 LAB — PREPARE RBC (CROSSMATCH)

## 2019-03-28 LAB — FOLATE: Folate: 5.4 ng/mL — ABNORMAL LOW (ref >5.9)

## 2019-03-28 MED ORDER — POTASSIUM CHLORIDE IN NACL 20-0.9 MEQ/L-% IV SOLN
INTRAVENOUS | Status: DC
  Administered 2019-03-28 (×2): via INTRAVENOUS
  Filled 2019-03-28 (×2): qty 1000

## 2019-03-28 MED ORDER — ACETAMINOPHEN 500 MG PO TABS
1000.0000 mg | ORAL_TABLET | Freq: Three times a day (TID) | ORAL | Status: DC
  Administered 2019-03-28 – 2019-03-31 (×8): 1000 mg via ORAL
  Filled 2019-03-28 (×9): qty 2

## 2019-03-28 MED ORDER — TAMSULOSIN HCL 0.4 MG PO CAPS
0.4000 mg | ORAL_CAPSULE | Freq: Every day | ORAL | Status: DC
  Administered 2019-03-28 – 2019-03-31 (×4): 0.4 mg via ORAL
  Filled 2019-03-28 (×4): qty 1

## 2019-03-28 MED ORDER — FOLIC ACID 1 MG PO TABS
1.0000 mg | ORAL_TABLET | Freq: Every day | ORAL | Status: DC
  Administered 2019-03-28 – 2019-03-31 (×4): 1 mg via ORAL
  Filled 2019-03-28 (×4): qty 1

## 2019-03-28 MED ORDER — TAB-A-VITE/IRON PO TABS
1.0000 | ORAL_TABLET | Freq: Every day | ORAL | Status: DC
  Administered 2019-03-28 – 2019-03-31 (×4): 1 via ORAL
  Filled 2019-03-28 (×5): qty 1

## 2019-03-28 MED ORDER — ENOXAPARIN SODIUM 40 MG/0.4ML ~~LOC~~ SOLN
40.0000 mg | SUBCUTANEOUS | Status: DC
  Administered 2019-03-29 – 2019-03-31 (×3): 40 mg via SUBCUTANEOUS
  Filled 2019-03-28 (×4): qty 0.4

## 2019-03-28 MED ORDER — POTASSIUM CHLORIDE CRYS ER 20 MEQ PO TBCR
20.0000 meq | EXTENDED_RELEASE_TABLET | Freq: Two times a day (BID) | ORAL | Status: DC
  Administered 2019-03-28 – 2019-03-31 (×7): 20 meq via ORAL
  Filled 2019-03-28 (×7): qty 1

## 2019-03-28 MED ORDER — SODIUM CHLORIDE 0.9% IV SOLUTION
Freq: Once | INTRAVENOUS | Status: AC
  Administered 2019-03-28: 15:00:00 via INTRAVENOUS

## 2019-03-28 MED ORDER — HYDROMORPHONE HCL 1 MG/ML IJ SOLN
0.5000 mg | INTRAMUSCULAR | Status: DC | PRN
  Administered 2019-03-28 – 2019-03-31 (×10): 1 mg via INTRAVENOUS
  Filled 2019-03-28 (×11): qty 1

## 2019-03-28 MED ORDER — HYDROMORPHONE HCL 1 MG/ML IJ SOLN
1.0000 mg | INTRAMUSCULAR | Status: DC | PRN
  Administered 2019-03-28: 1 mg via INTRAVENOUS
  Filled 2019-03-28: qty 1

## 2019-03-28 MED ORDER — FUROSEMIDE 20 MG PO TABS
20.0000 mg | ORAL_TABLET | Freq: Once | ORAL | Status: AC
  Administered 2019-03-28: 20 mg via ORAL
  Filled 2019-03-28: qty 1

## 2019-03-28 MED ORDER — ONDANSETRON 4 MG PO TBDP: 4.0000 mg | ORAL_TABLET | Freq: Four times a day (QID) | ORAL | Status: DC | PRN

## 2019-03-28 MED ORDER — ONDANSETRON HCL 4 MG/2ML IJ SOLN: 4.0000 mg | Freq: Four times a day (QID) | INTRAMUSCULAR | Status: DC | PRN

## 2019-03-28 NOTE — Consult Note (Addendum)
[facility] Consult note  Gerald Adkins OZD:[id number] DOB: 17-Sep-1956 DOA: 03/27/2019 Referring physician: ED PCP: [name], MD   Chief Complaint: Fall leading to severe abdominal pain Requesting physician: Dr. 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   -Patient said he has already had 4-5 episodes of liquid bowel movement since admission. -General surgery following.  Currently on full liquid diet.  Surgery to advance.  Tachycardia  -Since admission yesterday, patient has been consistently tachycardic between 100 to 20. -Not septic, not hypotensive.  Could be related to significant degree of anemia.  -EKG shows sinus tachycardia. -He denies using alcohol since his last admission. -Monitor in telemetry.  Acute on chronic anemia -Baseline hemoglobin 8-9 from 2019. Since last admission on 4/15, her hemoglobin has been been running between 7-8 requiring multiple transfusions.  Presented with a hemoglobin of 8.2 yesterday which dropped to 6.7 this morning and is 7 on repeat check. -Noted 1 unit of PRBC transfusion ordered today.  Repeat blood work Architectural technologist.  Iron deficiency anemia -Microcytic anemia.  Iron profile obtained today shows ferritin level low at 16, iron saturation low at 3.  Will benefit from long-term iron  replacement.   Folate deficiency -Folic acid level low at 5.4.  Once oral intake is resumed, will start on folic acid replacement.  Vitamin B-12 level pending.  Hypokalemia -Potassium level was low at 3 on admission last night.  Replaced.  3.4 this morning.  Malignant melanoma -Presented with SVC syndrome in 2019.  Pathology consistent with metastatic melanoma.  Underwent palliative radiotherapy and immunotherapy with Keytruda. -Under the care of Dr. Julien Nordmann.  But has not seen him in a while. -Patient states he is trying natural/herbal medicine to stay healthy.  He smokes pot and drinks alcohol on weekends.  Mobility: Encourage ambulation Diet: Full liquid diet for now.  Surgery to advance DVT prophylaxis:  Currently on Lovenox subcu.  Watch out for any bleeding episodes. Code Status:  Full code Disposition Plan:  Pending clinical course  TR[facility] team will followup again tomorrow. Please contact me if I can be of assistance in the meanwhile. Thank you for this consultation. ----------------------------------------------------------------------------------------------------- History of Present Illness: Gerald Adkins is a 63 y.o. male with history of malignant melanoma, chronic back pain, hypertension, hyperlipidemia, stroke, remote history of opiate addiction. On 4/15, patient was admitted for intussusception of bowel secondary to metastatic melanoma.  On 4/16, he underwent right hemicolectomy and small bowel resection.  Surgical pathology confirmed metastatic melanoma with 1/21 lymph nodes positive.  He had significant postoperative ileus.  But bowel function eventually returned, his diet was slowly advanced and he was discharged home 3 days ago on 4/24. Apparently, he tripped over rug at home leading to fall around 5:30 PM yesterday 4/27 leading to severe abdominal pain and hence return to the ED. In the ED, patient was afebrile but tachycardic with heart rate between 110 and 120.  Blood  pressure stable in normal range.  Oxygen saturation more 90% on room  air. Labs showed WBC count normal at 10, hemoglobin low at 8.2, platelet normal at 387, sodium level normal at 135, potassium level low at 3, liver enzymes normal.  Iron profile shows ferritin level significantly low at 16, folate level low at 5.4, vitamin B12 is pending.  CT of the abdomen/pelvis with contrast obtained on admission showed: 1. postoperative changes from right hemicolectomy with partial small bowel resection, diffuse dilatation to the level of the surgical anastomosis favored to reflect postoperative ileus.   2. Small to moderate volume of free fluid within the abdomen pelvis with persistent small volume of intraperitoneal air findings were somewhat greater than expected for normal postoperative changes given the patient is 10 days postop, some concern for possible enteric leak, although nothing was visualized on the CT exam.  No loculated collections of intra-abdominal abscess identified.  3. 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 4. Pulmonary nodule left lung slightly increased.  CT of the lumbar spine shows: 1. No CT evidence for acute abnormality within the lumbar spine. 2. 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 3. 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 4. No evidence for osseous metastatic disease.  Patient was admitted under surgical service. Medical consultation was called this afternoon for comanagement of medical issues.   Review of Systems:  All systems were reviewed and were negative unless otherwise mentioned in the HPI   Past medical history: Past Medical History:  Diagnosis Date   Cancer ([place])    Chronic back pain 1989   broke back   Hyperlipidemia    Hypertension    Opiate addiction ([place])    remote   Stroke [facility])      Past surgical history: Past Surgical History:  Procedure Laterality Date   APPENDECTOMY     BACK SURGERY     lumbar spine, cut sciatic nerve   CHOLECYSTECTOMY     LAPAROTOMY N/A 03/17/2019   Procedure: EXPLORATORY LAPAROTOMY SMALL BOWEL AND RIGHT COLON RESECTION;  Surgeon: Rolm Bookbinder, MD;  Location: [facility];  Service: General;  Laterality: N/A;   SCALENE NODE BIOPSY Right 08/23/2018   Procedure: BIOPSY OF RIGHT SCALENE LYMPH NODE;  Surgeon: Grace Isaac, MD;  Location: [place];  Service: Thoracic;  Laterality: Right;    Social History:  reports that he quit smoking about 21 years ago. He has a 49.50 pack-year smoking history. He has never used smokeless tobacco. He reports current alcohol use. He reports current drug use. Frequency: 3.00 times per week. Drug: Marijuana.  Allergies:  Allergies  Allergen Reactions   Butterscotch Flavor Anaphylaxis and Shortness Of Breath    Family history:  Family History  Problem Relation Age of Onset   Breast cancer Mother        multiple types   Lung cancer Maternal Aunt        nonsmoking   Pancreatic cancer Maternal Aunt      Home Meds: Prior to Admission medications   Medication Sig Start Date End Date Taking? Authorizing Provider  Alpha-D-Galactosidase (BEANO PO) Take 1 tablet by mouth daily as needed (gas).   Yes [provider]  Aspirin-Salicylamide-Caffeine (BC HEADACHE POWDER PO) Take 1 packet by mouth 3 (three) times daily as needed (headache, pain).   Yes [provider]  traMADol (ULTRAM) 50 MG tablet Take 1 tablet (50 mg total) by mouth every 6 (six) hours as needed for moderate pain or severe pain.  03/26/19  Yes Coralie Keens, MD    Physical Exam: Vitals:   03/28/19 0200 03/28/19 0235 03/28/19 1300 03/28/19 1404  BP: 107/72 121/89 104/75   Pulse: (!) 110 (!) 110 (!) 110   Resp: (!) 21 16 18    Temp:  98.1 F (36.7 C) 98.2 F (36.8 C)   TempSrc:  Oral Oral   SpO2: 93% 97% 100%  97%  Weight:      Height:       Wt Readings from Last 3 Encounters:  03/27/19 77.1 kg  03/17/19 74.8 kg  10/11/18 76.9 kg   Body mass index is 22.43 kg/m.  General exam: Pleasant middle-aged Caucasian male, looks older for his age.  Propped up in bed.  Not in distress at rest. Skin: No rashes, lesions or ulcers. HEENT: Normal Lungs: Clear to auscultation bilaterally CVS: Regular rate and rhythm, no murmur GI/Abd well-healing incision in the midline from recent surgery, bandage over the incision on lower abdomen.  Mild to moderate tenderness in the lower abdomen, nontender elsewhere.  Bowel sounds present CNS: Alert, awake, oriented x3 Psychiatry: Mood & affect appropriate.  Extremities: No pedal edema, no calf tenderness  Recent labs:  CBC: Recent Labs  Lab 03/24/19 0929 03/26/19 0340 03/27/19 2011 03/28/19 0756 03/28/19 1034  WBC 9.1 7.1 10.0 5.6 6.0  HGB 8.4* 7.0* 8.2* 6.7* 7.0*  HCT 31.7* 26.4* 29.5* 24.2* 25.2*  MCV 73.5* 72.5* 70.9* 71.6* 71.4*  PLT 390 330 387 326 102    Basic Metabolic Panel: Recent Labs  Lab 03/23/19 0425 03/24/19 0417 03/24/19 0929 03/26/19 0340 03/27/19 2011 03/28/19 0756  NA 134*  --  137 133* 135 135  K 3.2*  --  3.6 4.2 3.0* 3.4*  CL 102  --  103 100 95* 104  CO2 26  --  24 27 28 24   GLUCOSE 96  --  119* 121* 116* 82  BUN 6  --  6 6 9 8   CREATININE 0.42* 0.52* 0.54* 0.53* 0.62 0.42*  CALCIUM 7.5*  --  8.2* 7.7* 8.6* 7.2*    Liver Function Tests: Recent Labs  Lab 03/27/19 2011  AST 15  ALT 12  ALKPHOS 95  BILITOT 0.7  PROT 7.1  ALBUMIN 3.0*   Recent Labs  Lab 03/27/19 2011  LIPASE 43   No results for input(s): AMMONIA in the last 168 hours.  Cardiac Enzymes: No results for input(s): CKTOTAL, CKMB, CKMBINDEX, TROPONINI in the last 168 hours.  BNP (last 3 results) Recent Labs    08/22/18 1221 08/30/18 1721  BNP 47.0 31.2    ProBNP (last 3 results) No results for input(s): PROBNP in the last 8760  hours.  CBG: No results for input(s): GLUCAP in the last 168 hours.  Lipase     Component Value Date/Time   LIPASE 43 03/27/2019 2011     Urinalysis    Component Value Date/Time   COLORURINE YELLOW 03/28/2019 0012   APPEARANCEUR CLEAR 03/28/2019 0012   LABSPEC 1.027 03/28/2019 0012   PHURINE 5.0 03/28/2019 0012   GLUCOSEU NEGATIVE 03/28/2019 0012   HGBUR NEGATIVE 03/28/2019 0012   BILIRUBINUR NEGATIVE 03/28/2019 0012   KETONESUR NEGATIVE 03/28/2019 0012   PROTEINUR NEGATIVE 03/28/2019 0012   UROBILINOGEN 1.0 04/19/2013 1739   NITRITE NEGATIVE 03/28/2019 0012   LEUKOCYTESUR NEGATIVE 03/28/2019 0012     Drugs of Abuse  No results found for: LABOPIA, [place], [place], AMPHETMU, THCU, [place] on Admission: Dg Chest 2 View  Result Date: 03/28/2019 CLINICAL DATA:  Postoperative state. EXAM: CHEST - 2 VIEW COMPARISON:  CT scan and radiographs of August 30, 2018. FINDINGS: Stable cardiomegaly. Right paratracheal mass is significantly smaller. No pneumothorax is noted. Elevated right hemidiaphragm is noted. Left lung is clear. Bony thorax is unremarkable. IMPRESSION: Right paratracheal mass noted on prior exam is significantly smaller. Elevated right hemidiaphragm is noted with probable small right pleural effusion. No other significant abnormality seen in the chest. Electronically Signed   By: Marijo Conception M.D.   On: 03/28/2019 13:06   Ct Abdomen Pelvis W Contrast  Result Date: 03/27/2019 CLINICAL DATA:  Initial evaluation for acute abdominal and back pain. EXAM: CT ABDOMEN AND PELVIS WITHOUT CONTRAST TECHNIQUE: Multidetector CT imaging of the abdomen and pelvis was performed following the standard protocol without IV contrast. COMPARISON:  Comparison made with prior CT from 03/16/2019. FINDINGS: CT ABDOMEN AND PELVIS Lower chest: Small layering right pleural effusion with associated atelectasis. Probable postradiation changes partially visualize within  the right perihilar region, grossly similar. 2.8 cm pleural base nodule at the lingula is increased in size (series 6, image 21). Additional 15 mm left lower lobe nodule also increased in size (series 6, image 25). 12 mm node noted near the AE recess, similar. Right cardiophrenic implant measures up to 2.6 cm, perhaps slightly increased in size from previous. Trace pericardial effusion noted. Hepatobiliary: 2.3 cm hypodense lesion at the right hepatic dome noted, stable, concerning for metastatic disease. Additional 15 mm lesion slightly inferiorly within the right hepatic lobe also similar. Additional subcentimeter hypodensity within the right hepatic lobe too small the characterize and could reflect a small cyst versus metastatic implant, stable from previous. Gallbladder surgically absent. Mild scattered intrahepatic biliary dilatation similar to previous. Pancreas: Pancreas within normal limits. Spleen: Spleen within normal limits. Adrenals/Urinary Tract: Right adrenal gland is normal. 19 mm left adrenal nodule noted, stable. Kidneys equal in size with symmetric enhancement. No nephrolithiasis, hydronephrosis or focal enhancing renal mass. No hydroureter. Bladder within normal limits. Stomach/Bowel: Stomach largely decompressed without acute finding. Postoperative changes from interval right hemicolectomy with partial small bowel resection and ileocolonic anastomosis. Small bowel is diffusely dilated and fluid-filled to the level of the surgical anastomosis within the right lower quadrant, measuring up to 5 cm in maximal diameter. Few scattered internal air-fluid levels. Mild gaseous distension of the colon. Imaging findings favored to reflect postoperative ileus. Few scattered colonic diverticula noted without evidence for acute diverticulitis. Small to moderate volume free fluid present within the abdomen and pelvis, measuring simple fluid density. Additionally, scattered small volume free air seen within the  abdomen, somewhat greater than expected for approximately 10 days post surgery. Findings raise the possibility for an ongoing leak, source not identified. No discrete loculated collections or intra-abdominal abscess. Vascular/Lymphatic: Moderate to advanced aorto bi-iliac atherosclerotic disease. Infrarenal aorta ectatic measuring up to 2.8 cm in transverse diameter. Mesenteric vessels patent proximally. Enlarged porta hepatis lymph nodes again seen, measuring up to 2.4 cm in maximal diameter, perhaps slightly increased in size from previous. No other new adenopathy. Reproductive: Prostate normal. Other: Focal skin breakdown noted along the midline incision. No loculated collections or other complication along the surgical incision itself. Mild diffuse anasarca noted. Musculoskeletal: No acute osseous abnormality. No discrete lytic or blastic osseous lesions. CT LUMBAR SPINE: Segmentation: Standard. Lowest well-formed disc labeled the L5-S1 level. Alignment: Straightening with slight reversal of the normal lumbar lordosis. Trace retrolisthesis of L3 on L4 and L4 on L5, likely chronic and  facet mediated. No malalignment. Vertebrae: Vertebral body heights maintained without evidence for acute or chronic fracture. No discrete lytic or blastic osseous lesions. Visualized sacrum and pelvis intact. SI joints approximated and symmetric. Paraspinal and other soft tissues: Paraspinous soft tissues demonstrate no acute finding. Disc levels: T12-L1: Disc desiccation with mild diffuse disc bulge.  No stenosis. L1-2: Chronic intervertebral disc space narrowing with disc desiccation and diffuse disc bulge. Right far lateral endplate osteophytic spurring. No significant stenosis. L2-3: Diffuse disc bulge with intervertebral disc space narrowing. Prominent reactive endplate changes with endplate osteophytic spurring. Mild disc desiccation. Mild bilateral facet hypertrophy. Resultant mild spinal stenosis. Mild to moderate right L2  foraminal narrowing. L3-4: Diffuse disc bulge with disc desiccation and intervertebral disc space narrowing. Reactive endplate changes. Mild facet hypertrophy. Resultant moderate spinal stenosis. Foramina remain patent. L4-5: Diffuse disc bulge with associated annular calcification. Moderate facet hypertrophy. Resultant severe spinal stenosis. Mild to moderate bilateral L4 foraminal narrowing. L5-S1: Chronic intervertebral disc space narrowing with diffuse disc bulge and disc desiccation. Reactive endplate changes with marginal endplate osteophytic spurring. No significant spinal stenosis. Advanced bilateral L5 foraminal narrowing. IMPRESSION: CT ABDOMEN AND PELVIS IMPRESSION 1. Postoperative changes from recent right hemicolectomy with partial small bowel resection. Diffuse small bowel dilatation to the level of the surgical anastomosis favored to reflect postoperative ileus, although an obstructive process would be difficult to exclude, and could be considered in the correct clinical setting. 2. Small moderate volume free fluid within the abdomen and pelvis with persistent small volume free intraperitoneal air. Findings are somewhat greater than expected for normal expected postoperative changes given the patient is postoperative day 10 from prior surgery. Findings raise the possibility for an enteric leak, although source is not definitely visualized on this exam. No loculated collections or intra-abdominal abscess identified. 3. Pulmonary nodules at the left lung base as above, slightly increased in size, suggesting mildly progressed disease. Scattered mediastinal and upper abdominal adenopathy stable to slightly increased in size as well. 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 4. Aortic atherosclerosis. CT LUMBAR SPINE IMPRESSION 1. No CT evidence for acute abnormality within the lumbar spine. 2. Moderate multilevel degenerative spondylolysis with resultant moderate to  severe spinal stenosis at L3-4 and L4-5. 3. 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 4. No evidence for osseous metastatic disease. Results were discussed by telephone at the time of interpretation on 03/27/2019 at 11:57 pm with Dr. Lennice Sites. Electronically Signed   By: Jeannine Boga M.D.   On: 03/27/2019 23:59   Ct L-spine No Charge  Result Date: 03/27/2019 CLINICAL DATA:  Initial evaluation for acute abdominal and back pain. EXAM: CT ABDOMEN AND PELVIS WITHOUT CONTRAST TECHNIQUE: Multidetector CT imaging of the abdomen and pelvis was performed following the standard protocol without IV contrast. COMPARISON:  Comparison made with prior CT from 03/16/2019. FINDINGS: CT ABDOMEN AND PELVIS Lower chest: Small layering right pleural effusion with associated atelectasis. Probable postradiation changes partially visualize within the right perihilar region, grossly similar. 2.8 cm pleural base nodule at the lingula is increased in size (series 6, image 21). Additional 15 mm left lower lobe nodule also increased in size (series 6, image 25). 12 mm node noted near the AE recess, similar. Right cardiophrenic implant measures up to 2.6 cm, perhaps slightly increased in size from previous. Trace pericardial effusion noted. Hepatobiliary: 2.3 cm hypodense lesion at the right hepatic dome noted, stable, concerning for metastatic disease. Additional 15 mm lesion slightly inferiorly within the  right hepatic lobe also similar. Additional subcentimeter hypodensity within the right hepatic lobe too small the characterize and could reflect a small cyst versus metastatic implant, stable from previous. Gallbladder surgically absent. Mild scattered intrahepatic biliary dilatation similar to previous. Pancreas: Pancreas within normal limits. Spleen: Spleen within normal limits. Adrenals/Urinary Tract: Right adrenal gland is normal. 19 mm left adrenal nodule noted, stable.  Kidneys equal in size with symmetric enhancement. No nephrolithiasis, hydronephrosis or focal enhancing renal mass. No hydroureter. Bladder within normal limits. Stomach/Bowel: Stomach largely decompressed without acute finding. Postoperative changes from interval right hemicolectomy with partial small bowel resection and ileocolonic anastomosis. Small bowel is diffusely dilated and fluid-filled to the level of the surgical anastomosis within the right lower quadrant, measuring up to 5 cm in maximal diameter. Few scattered internal air-fluid levels. Mild gaseous distension of the colon. Imaging findings favored to reflect postoperative ileus. Few scattered colonic diverticula noted without evidence for acute diverticulitis. Small to moderate volume free fluid present within the abdomen and pelvis, measuring simple fluid density. Additionally, scattered small volume free air seen within the abdomen, somewhat greater than expected for approximately 10 days post surgery. Findings raise the possibility for an ongoing leak, source not identified. No discrete loculated collections or intra-abdominal abscess. Vascular/Lymphatic: Moderate to advanced aorto bi-iliac atherosclerotic disease. Infrarenal aorta ectatic measuring up to 2.8 cm in transverse diameter. Mesenteric vessels patent proximally. Enlarged porta hepatis lymph nodes again seen, measuring up to 2.4 cm in maximal diameter, perhaps slightly increased in size from previous. No other new adenopathy. Reproductive: Prostate normal. Other: Focal skin breakdown noted along the midline incision. No loculated collections or other complication along the surgical incision itself. Mild diffuse anasarca noted. Musculoskeletal: No acute osseous abnormality. No discrete lytic or blastic osseous lesions. CT LUMBAR SPINE: Segmentation: Standard. Lowest well-formed disc labeled the L5-S1 level. Alignment: Straightening with slight reversal of the normal lumbar lordosis. Trace  retrolisthesis of L3 on L4 and L4 on L5, likely chronic and facet mediated. No malalignment. Vertebrae: Vertebral body heights maintained without evidence for acute or chronic fracture. No discrete lytic or blastic osseous lesions. Visualized sacrum and pelvis intact. SI joints approximated and symmetric. Paraspinal and other soft tissues: Paraspinous soft tissues demonstrate no acute finding. Disc levels: T12-L1: Disc desiccation with mild diffuse disc bulge.  No stenosis. L1-2: Chronic intervertebral disc space narrowing with disc desiccation and diffuse disc bulge. Right far lateral endplate osteophytic spurring. No significant stenosis. L2-3: Diffuse disc bulge with intervertebral disc space narrowing. Prominent reactive endplate changes with endplate osteophytic spurring. Mild disc desiccation. Mild bilateral facet hypertrophy. Resultant mild spinal stenosis. Mild to moderate right L2 foraminal narrowing. L3-4: Diffuse disc bulge with disc desiccation and intervertebral disc space narrowing. Reactive endplate changes. Mild facet hypertrophy. Resultant moderate spinal stenosis. Foramina remain patent. L4-5: Diffuse disc bulge with associated annular calcification. Moderate facet hypertrophy. Resultant severe spinal stenosis. Mild to moderate bilateral L4 foraminal narrowing. L5-S1: Chronic intervertebral disc space narrowing with diffuse disc bulge and disc desiccation. Reactive endplate changes with marginal endplate osteophytic spurring. No significant spinal stenosis. Advanced bilateral L5 foraminal narrowing. IMPRESSION: CT ABDOMEN AND PELVIS IMPRESSION 1. Postoperative changes from recent right hemicolectomy with partial small bowel resection. Diffuse small bowel dilatation to the level of the surgical anastomosis favored to reflect postoperative ileus, although an obstructive process would be difficult to exclude, and could be considered in the correct clinical setting. 2. Small moderate volume free fluid  within the abdomen and pelvis with persistent small volume  free intraperitoneal air. Findings are somewhat greater than expected for normal expected postoperative changes given the patient is postoperative day 10 from prior surgery. Findings raise the possibility for an enteric leak, although source is not definitely visualized on this exam. No loculated collections or intra-abdominal abscess identified. 3. Pulmonary nodules at the left lung base as above, slightly increased in size, suggesting mildly progressed disease. Scattered mediastinal and upper abdominal adenopathy stable to slightly increased in size as well. 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 4. Aortic atherosclerosis. CT LUMBAR SPINE IMPRESSION 1. No CT evidence for acute abnormality within the lumbar spine. 2. 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 3. 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 4. No evidence for osseous metastatic disease. Results were discussed by telephone at the time of interpretation on 03/27/2019 at 11:57 pm with Dr. Lennice Sites. Electronically Signed   By: Jeannine Boga M.D.   On: 03/27/2019 23:59   ----------------------------------------------------------------------------------------------------------------------------------------------------------- Time spent: 55 minutes  Signed, Terrilee Croak, MD [facility] 03/28/2019

## 2019-03-28 NOTE — H&P (Addendum)
[facility] Admission Note  Gerald Adkins 17-Nov-1956  [id number].    Chief Complaint: Patient fell at home around 1730 hrs. on 03/27/2019 and return to the ED with severe abdominal pain.  HPI:  Patient is a 63 year old male who was admitted on 03/16/2019, with weakness and 2 weeks of worsening abdominal pain, bloody diarrhea nausea and vomiting.  Work-up showed a bowel obstruction likely secondary to intussusception from metastatic melanoma.  He was taken to the operating room on 03/17/2019 and underwent right hemicolectomy and small bowel resection.  He had a postoperative ileus.  He was started on clears on his fifth postoperative day, NG was removed on 6 postoperative day and he was advanced to full liquids.  He developed nausea and vomiting after that.  He had issues with pain medications also.  He ultimately ended up on tramadol for oral pain control.  He had opening of his wound which was packed with wet-to-dry's and he was ready for discharge on 03/26/19.  He returned to the ED at 6 PM on 03/27/2019.  With the above-noted complaints.  Work-up in the ED showed he was afebrile slightly tachycardic with a heart rate between 110 and 120.  Vital signs are stable sats were good on nasal cannula. Labs showed potassium was low at 3.0.  Glucose was slightly elevated at 116, WBC 10,000, hemoglobin 8.2, hematocrit 29.5 platelets 387,000. CT of the abdomen/pelvis with contrast obtained last evening showed postoperative changes from right hemicolectomy with partial small bowel resection, diffuse dilatation to the level of the surgical anastomosis favored to reflect postoperative ileus.  Small to moderate volume of free fluid within the abdomen pelvis with persistent small volume of intraperitoneal air findings were somewhat greater than expected for normal postoperative changes given the patient is 10 days postop, some concern for possible enteric leak, although nothing was visualized on the CT exam.   No loculated collections of intra-abdominal abscess identified.  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 Pulmonary nodule left lung slightly increased.  CT of the lumbar spine shows no evidence of acute abnormality within the lumbar spine, moderate multilevel degenerative spondylolysis with resultant moderate to severe spinal stenosis at L3- L4, L4-L5. T factorial degenerative changes with resultant advanced bilateral L5 foraminal stenosis.  No evidence of osseous metastasis.  He was readmitted last evening by Dr. Ninfa Linden. As noted above he remains mildly tachycardic.  No vital signs since 2 AM.  Labs shows potassium is up to 3.4, creatinine 0.42.  WBC 5.6, hemoglobin 6.7, hematocrit 24.2.  Platelets are normal.  Pathology from surgical specimen 03/17/2019:  Diagnosis Colon, segmental resection for tumor, right colon - METASTATIC MALIGNANT MELANOMA, MULTIPLE FOCI. - LYMPHOVASCULAR INVASION IS IDENTIFIED. - METASTATIC MELANOMA IN 1 OF 21 LYMPH NODES (1/21) - THE SURGICAL RESECTION MARGINS ARE NEGATIVE FOR TUMOR. - SEE COMMENT. Microscopic Comment There are multiple foci of metastatic melanoma involving the specimen, the largest of which spans 7.5 cm. Additional studies can be performed upon clinician request.  ROS: Review of Systems  Constitutional: Positive for malaise/fatigue.       Felt weak, but tripped over rug at home and that caused him to fall, no LOC he is aware of.  He is using marijuana at home Saturday and Sunday, day of fall.  HENT: Negative.   Eyes: Negative.   Respiratory: Positive for shortness of breath.   Cardiovascular: Positive for leg swelling. Negative for chest pain, palpitations, orthopnea, claudication and PND.  Sleeps up in chair like device for his back.  Gastrointestinal: Positive for abdominal pain (worsening pain after the fall.  Tolerating diet and BM's more solid at home.  ). Negative for blood in stool,  constipation, diarrhea, heartburn, melena, nausea and vomiting.  Genitourinary: Negative for dysuria, flank pain, frequency, hematuria and urgency.       History of BPH with prior urinary catheterizations.  We are checking a bladder scan.  Musculoskeletal: Positive for back pain (Prefers the strong marijuana he has at home over narcotics/opioids for his chronic back pain.).  Skin: Negative.   Neurological: Positive for dizziness.       He has been using marijuana at home both Saturday and Sunday.  Endo/Heme/Allergies: Negative.   Psychiatric/Behavioral: Negative.     Family History  Problem Relation Age of Onset  . Breast cancer Mother        multiple types  . Lung cancer Maternal Aunt        nonsmoking  . Pancreatic cancer Maternal Aunt     Past Medical History:  Diagnosis Date  . Cancer ([place])   . Chronic back pain 1989   broke back  . Hyperlipidemia   . Hypertension   . Opiate addiction ([place])    remote  . Stroke [facility])     Past Surgical History:  Procedure Laterality Date  . APPENDECTOMY    . BACK SURGERY     lumbar spine, cut sciatic nerve  . CHOLECYSTECTOMY    . LAPAROTOMY N/A 03/17/2019   Procedure: EXPLORATORY LAPAROTOMY SMALL BOWEL AND RIGHT COLON RESECTION;  Surgeon: Rolm Bookbinder, MD;  Location: [facility];  Service: General;  Laterality: N/A;  . SCALENE NODE BIOPSY Right 08/23/2018   Procedure: BIOPSY OF RIGHT SCALENE LYMPH NODE;  Surgeon: Grace Isaac, MD;  Location: [facility];  Service: Thoracic;  Laterality: Right;    Social History:  reports that he quit smoking about 21 years ago. He has a 49.50 pack-year smoking history. He has never used smokeless tobacco. He reports current alcohol use. He reports current drug use. Frequency: 3.00 times per week. Drug: Marijuana.  Tobacco: Up to 2 packs a day for 25 years, none x23 years. EtOH: Weekend drinker 12 pack -1 case per weekend; or 1/5 on the weekend. None since last admission. Marijuana: He says he is very  strong stuff and he was smoking on both Saturday and Sunday. Allergies:  Allergies  Allergen Reactions  . Butterscotch Flavor Anaphylaxis and Shortness Of Breath    Medications Prior to Admission  Medication Sig Dispense Refill  . Alpha-D-Galactosidase (BEANO PO) Take 1 tablet by mouth daily as needed (gas).    . Aspirin-Salicylamide-Caffeine (BC HEADACHE POWDER PO) Take 1 packet by mouth 3 (three) times daily as needed (headache, pain).    . traMADol (ULTRAM) 50 MG tablet Take 1 tablet (50 mg total) by mouth every 6 (six) hours as needed for moderate pain or severe pain. 25 tablet 0    Blood pressure 121/89, pulse (!) 110, temperature 98.1 F (36.7 C), temperature source Oral, resp. rate 16, height 6\' 1"  (1.854 m), weight 77.1 kg, SpO2 97 %. Physical Exam: Physical Exam Constitutional:      General: He is not in acute distress.    Appearance: He is not ill-appearing, toxic-appearing or diaphoretic.     Comments: Patient is pale, in no acute distress.  Dentition is poor repair he is bearded and appears much older than his stated age.  HENT:  Head: Normocephalic and atraumatic.  Eyes:     Comments: Both pupils are pinpoint  Cardiovascular:     Rate and Rhythm: Regular rhythm. Tachycardia present.     Heart sounds: Normal heart sounds.     Comments: Heart rate still in the 100 range, lying flat and at rest. Pulmonary:     Effort: No respiratory distress.     Breath sounds: No stridor. No wheezing, rhonchi or rales.     Comments: Respiratory rates about 22 Chest:     Chest wall: No tenderness.  Abdominal:     General: Abdomen is flat. A surgical scar is present. Bowel sounds are normal. There is no distension or abdominal bruit.     Palpations: There is no shifting dullness, fluid wave, hepatomegaly, splenomegaly, mass or pulsatile mass.     Tenderness: There is no right CVA tenderness, left CVA tenderness, guarding or rebound.     Hernia: No hernia is present.     Comments:  Midline incision is Steri-Stripped.  The upper portion is intact there is about 3 to 4 cm of it open at the base it is clean and packed wet-to-dry.  The midline incision that is open is clean and intact. He is complaining of pain primarily in the midline.    Genitourinary:    Comments: Some scrotal swelling. Bladder scan shows 800 cc; we will insert a Foley. Skin:    General: Skin is warm and dry.     Coloration: Skin is pale.     Comments: +1-+2 lower extremity edema lower legs and feet bilaterally.  He cannot get his slippers on at home.  Neurological:     General: No focal deficit present.     Mental Status: He is alert and oriented to person, place, and time.     Cranial Nerves: No cranial nerve deficit.  Psychiatric:        Mood and Affect: Mood normal. Mood is not anxious or depressed.        Behavior: Behavior normal.     Results for orders placed or performed during the hospital encounter of 03/27/19 (from the past 48 hour(s))  Lipase, blood     Status: None   Collection Time: 03/27/19  8:11 PM  Result Value Ref Range   Lipase 43 11 - 51 U/L    Comment: Performed at [facility], [place] [address] [place], [place] [address]  Comprehensive metabolic panel     Status: Abnormal   Collection Time: 03/27/19  8:11 PM  Result Value Ref Range   Sodium 135 135 - 145 mmol/L   Potassium 3.0 (L) 3.5 - 5.1 mmol/L    Comment: DELTA CHECK NOTED   Chloride 95 (L) 98 - 111 mmol/L   CO2 28 22 - 32 mmol/L   Glucose, Bld 116 (H) 70 - 99 mg/dL   BUN 9 6 - 20 mg/dL   Creatinine, Ser 0.62 0.61 - 1.24 mg/dL   Calcium 8.6 (L) 8.9 - 10.3 mg/dL   Total Protein 7.1 6.5 - 8.1 g/dL   Albumin 3.0 (L) 3.5 - 5.0 g/dL   AST 15 15 - 41 U/L   ALT 12 0 - 44 U/L   Alkaline Phosphatase 95 38 - 126 U/L   Total Bilirubin 0.7 0.3 - 1.2 mg/dL   GFR calc non Af Amer >60 >60 mL/min   GFR calc Af Amer >60 >60 mL/min   Anion gap 12 5 - 15    Comment: Performed  at [facility], [place] [address] [place], [place] [address]  CBC     Status: Abnormal   Collection Time: 03/27/19  8:11 PM  Result Value Ref Range   WBC 10.0 4.0 - 10.5 K/uL   RBC 4.16 (L) 4.22 - 5.81 MIL/uL   Hemoglobin 8.2 (L) 13.0 - 17.0 g/dL    Comment: Reticulocyte Hemoglobin testing may be clinically indicated, consider ordering this additional test [id number]    HCT 29.5 (L) 39.0 - 52.0 %   MCV 70.9 (L) 80.0 - 100.0 fL   MCH 19.7 (L) 26.0 - 34.0 pg   MCHC 27.8 (L) 30.0 - 36.0 g/dL   RDW 25.8 (H) 11.5 - 15.5 %   Platelets 387 150 - 400 K/uL   nRBC 0.0 0.0 - 0.2 %    Comment: Performed at [facility], [place] [address] [place], [place] [address]  Urinalysis, Routine w reflex microscopic     Status: None   Collection Time: 03/28/19 12:12 AM  Result Value Ref Range   Color, Urine YELLOW YELLOW   APPearance CLEAR CLEAR   Specific Gravity, Urine 1.027 1.005 - 1.030   pH 5.0 5.0 - 8.0   Glucose, UA NEGATIVE NEGATIVE mg/dL   Hgb urine dipstick NEGATIVE NEGATIVE   Bilirubin Urine NEGATIVE NEGATIVE   Ketones, ur NEGATIVE NEGATIVE mg/dL   Protein, ur NEGATIVE NEGATIVE mg/dL   Nitrite NEGATIVE NEGATIVE   Leukocytes,Ua NEGATIVE NEGATIVE    Comment: Performed at [facility] [address], [place], [place] [address]  Basic metabolic panel     Status: Abnormal   Collection Time: 03/28/19  7:56 AM  Result Value Ref Range   Sodium 135 135 - 145 mmol/L   Potassium 3.4 (L) 3.5 - 5.1 mmol/L   Chloride 104 98 - 111 mmol/L   CO2 24 22 - 32 mmol/L   Glucose, Bld 82 70 - 99 mg/dL   BUN 8 6 - 20 mg/dL   Creatinine, Ser 0.42 (L) 0.61 - 1.24 mg/dL   Calcium 7.2 (L) 8.9 - 10.3 mg/dL   GFR calc non Af Amer >60 >60 mL/min   GFR calc Af Amer >60 >60 mL/min   Anion gap 7 5 - 15    Comment: Performed at [facility], [place] [address] [place], [place] [address]  CBC     Status: Abnormal   Collection Time: 03/28/19  7:56 AM  Result Value Ref  Range   WBC 5.6 4.0 - 10.5 K/uL   RBC 3.38 (L) 4.22 - 5.81 MIL/uL   Hemoglobin 6.7 (LL) 13.0 - 17.0 g/dL    Comment: REPEATED TO VERIFY Reticulocyte Hemoglobin testing may be clinically indicated, consider ordering this additional test [id number] THIS CRITICAL RESULT HAS VERIFIED AND BEEN CALLED TO S.NORRIS RN BY SARA VANHOORNE ON 04 27 2020 AT 0805, AND HAS BEEN READ BACK.     HCT 24.2 (L) 39.0 - 52.0 %   MCV 71.6 (L) 80.0 - 100.0 fL   MCH 19.8 (L) 26.0 - 34.0 pg   MCHC 27.7 (L) 30.0 - 36.0 g/dL   RDW 25.6 (H) 11.5 - 15.5 %   Platelets 326 150 - 400 K/uL   nRBC 0.0 0.0 - 0.2 %    Comment: Performed at [facility], [place] [address], [place], [place] [address]   Ct Abdomen Pelvis W Contrast  Result Date: 03/27/2019 CLINICAL DATA:  Initial evaluation for acute abdominal and back pain. EXAM:  CT ABDOMEN AND PELVIS WITHOUT CONTRAST TECHNIQUE: Multidetector CT imaging of the abdomen and pelvis was performed following the standard protocol without IV contrast. COMPARISON:  Comparison made with prior CT from 03/16/2019. FINDINGS: CT ABDOMEN AND PELVIS Lower chest: Small layering right pleural effusion with associated atelectasis. Probable postradiation changes partially visualize within the right perihilar region, grossly similar. 2.8 cm pleural base nodule at the lingula is increased in size (series 6, image 21). Additional 15 mm left lower lobe nodule also increased in size (series 6, image 25). 12 mm node noted near the AE recess, similar. Right cardiophrenic implant measures up to 2.6 cm, perhaps slightly increased in size from previous. Trace pericardial effusion noted. Hepatobiliary: 2.3 cm hypodense lesion at the right hepatic dome noted, stable, concerning for metastatic disease. Additional 15 mm lesion slightly inferiorly within the right hepatic lobe also similar. Additional subcentimeter hypodensity within the right hepatic lobe too small the characterize and could reflect a  small cyst versus metastatic implant, stable from previous. Gallbladder surgically absent. Mild scattered intrahepatic biliary dilatation similar to previous. Pancreas: Pancreas within normal limits. Spleen: Spleen within normal limits. Adrenals/Urinary Tract: Right adrenal gland is normal. 19 mm left adrenal nodule noted, stable. Kidneys equal in size with symmetric enhancement. No nephrolithiasis, hydronephrosis or focal enhancing renal mass. No hydroureter. Bladder within normal limits. Stomach/Bowel: Stomach largely decompressed without acute finding. Postoperative changes from interval right hemicolectomy with partial small bowel resection and ileocolonic anastomosis. Small bowel is diffusely dilated and fluid-filled to the level of the surgical anastomosis within the right lower quadrant, measuring up to 5 cm in maximal diameter. Few scattered internal air-fluid levels. Mild gaseous distension of the colon. Imaging findings favored to reflect postoperative ileus. Few scattered colonic diverticula noted without evidence for acute diverticulitis. Small to moderate volume free fluid present within the abdomen and pelvis, measuring simple fluid density. Additionally, scattered small volume free air seen within the abdomen, somewhat greater than expected for approximately 10 days post surgery. Findings raise the possibility for an ongoing leak, source not identified. No discrete loculated collections or intra-abdominal abscess. Vascular/Lymphatic: Moderate to advanced aorto bi-iliac atherosclerotic disease. Infrarenal aorta ectatic measuring up to 2.8 cm in transverse diameter. Mesenteric vessels patent proximally. Enlarged porta hepatis lymph nodes again seen, measuring up to 2.4 cm in maximal diameter, perhaps slightly increased in size from previous. No other new adenopathy. Reproductive: Prostate normal. Other: Focal skin breakdown noted along the midline incision. No loculated collections or other complication  along the surgical incision itself. Mild diffuse anasarca noted. Musculoskeletal: No acute osseous abnormality. No discrete lytic or blastic osseous lesions. CT LUMBAR SPINE: Segmentation: Standard. Lowest well-formed disc labeled the L5-S1 level. Alignment: Straightening with slight reversal of the normal lumbar lordosis. Trace retrolisthesis of L3 on L4 and L4 on L5, likely chronic and facet mediated. No malalignment. Vertebrae: Vertebral body heights maintained without evidence for acute or chronic fracture. No discrete lytic or blastic osseous lesions. Visualized sacrum and pelvis intact. SI joints approximated and symmetric. Paraspinal and other soft tissues: Paraspinous soft tissues demonstrate no acute finding. Disc levels: T12-L1: Disc desiccation with mild diffuse disc bulge.  No stenosis. L1-2: Chronic intervertebral disc space narrowing with disc desiccation and diffuse disc bulge. Right far lateral endplate osteophytic spurring. No significant stenosis. L2-3: Diffuse disc bulge with intervertebral disc space narrowing. Prominent reactive endplate changes with endplate osteophytic spurring. Mild disc desiccation. Mild bilateral facet hypertrophy. Resultant mild spinal stenosis. Mild to moderate right L2 foraminal narrowing. L3-4: Diffuse  disc bulge with disc desiccation and intervertebral disc space narrowing. Reactive endplate changes. Mild facet hypertrophy. Resultant moderate spinal stenosis. Foramina remain patent. L4-5: Diffuse disc bulge with associated annular calcification. Moderate facet hypertrophy. Resultant severe spinal stenosis. Mild to moderate bilateral L4 foraminal narrowing. L5-S1: Chronic intervertebral disc space narrowing with diffuse disc bulge and disc desiccation. Reactive endplate changes with marginal endplate osteophytic spurring. No significant spinal stenosis. Advanced bilateral L5 foraminal narrowing. IMPRESSION: CT ABDOMEN AND PELVIS IMPRESSION 1. Postoperative changes from  recent right hemicolectomy with partial small bowel resection. Diffuse small bowel dilatation to the level of the surgical anastomosis favored to reflect postoperative ileus, although an obstructive process would be difficult to exclude, and could be considered in the correct clinical setting. 2. Small moderate volume free fluid within the abdomen and pelvis with persistent small volume free intraperitoneal air. Findings are somewhat greater than expected for normal expected postoperative changes given the patient is postoperative day 10 from prior surgery. Findings raise the possibility for an enteric leak, although source is not definitely visualized on this exam. No loculated collections or intra-abdominal abscess identified. 3. Pulmonary nodules at the left lung base as above, slightly increased in size, suggesting mildly progressed disease. Scattered mediastinal and upper abdominal adenopathy stable to slightly increased in size as well. 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 4. Aortic atherosclerosis. CT LUMBAR SPINE IMPRESSION 1. No CT evidence for acute abnormality within the lumbar spine. 2. 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 3. 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 4. No evidence for osseous metastatic disease. Results were discussed by telephone at the time of interpretation on 03/27/2019 at 11:57 pm with Dr. Lennice Sites. Electronically Signed   By: Jeannine Boga M.D.   On: 03/27/2019 23:59   Ct L-spine No Charge  Result Date: 03/27/2019 CLINICAL DATA:  Initial evaluation for acute abdominal and back pain. EXAM: CT ABDOMEN AND PELVIS WITHOUT CONTRAST TECHNIQUE: Multidetector CT imaging of the abdomen and pelvis was performed following the standard protocol without IV contrast. COMPARISON:  Comparison made with prior CT from  03/16/2019. FINDINGS: CT ABDOMEN AND PELVIS Lower chest: Small layering right pleural effusion with associated atelectasis. Probable postradiation changes partially visualize within the right perihilar region, grossly similar. 2.8 cm pleural base nodule at the lingula is increased in size (series 6, image 21). Additional 15 mm left lower lobe nodule also increased in size (series 6, image 25). 12 mm node noted near the AE recess, similar. Right cardiophrenic implant measures up to 2.6 cm, perhaps slightly increased in size from previous. Trace pericardial effusion noted. Hepatobiliary: 2.3 cm hypodense lesion at the right hepatic dome noted, stable, concerning for metastatic disease. Additional 15 mm lesion slightly inferiorly within the right hepatic lobe also similar. Additional subcentimeter hypodensity within the right hepatic lobe too small the characterize and could reflect a small cyst versus metastatic implant, stable from previous. Gallbladder surgically absent. Mild scattered intrahepatic biliary dilatation similar to previous. Pancreas: Pancreas within normal limits. Spleen: Spleen within normal limits. Adrenals/Urinary Tract: Right adrenal gland is normal. 19 mm left adrenal nodule noted, stable. Kidneys equal in size with symmetric enhancement. No nephrolithiasis, hydronephrosis or focal enhancing renal mass. No hydroureter. Bladder within normal limits. Stomach/Bowel: Stomach largely decompressed without acute finding. Postoperative changes from interval right hemicolectomy with partial small bowel resection and ileocolonic anastomosis. Small bowel is diffusely dilated and fluid-filled to the level of  the surgical anastomosis within the right lower quadrant, measuring up to 5 cm in maximal diameter. Few scattered internal air-fluid levels. Mild gaseous distension of the colon. Imaging findings favored to reflect postoperative ileus. Few scattered colonic diverticula noted without evidence for acute  diverticulitis. Small to moderate volume free fluid present within the abdomen and pelvis, measuring simple fluid density. Additionally, scattered small volume free air seen within the abdomen, somewhat greater than expected for approximately 10 days post surgery. Findings raise the possibility for an ongoing leak, source not identified. No discrete loculated collections or intra-abdominal abscess. Vascular/Lymphatic: Moderate to advanced aorto bi-iliac atherosclerotic disease. Infrarenal aorta ectatic measuring up to 2.8 cm in transverse diameter. Mesenteric vessels patent proximally. Enlarged porta hepatis lymph nodes again seen, measuring up to 2.4 cm in maximal diameter, perhaps slightly increased in size from previous. No other new adenopathy. Reproductive: Prostate normal. Other: Focal skin breakdown noted along the midline incision. No loculated collections or other complication along the surgical incision itself. Mild diffuse anasarca noted. Musculoskeletal: No acute osseous abnormality. No discrete lytic or blastic osseous lesions. CT LUMBAR SPINE: Segmentation: Standard. Lowest well-formed disc labeled the L5-S1 level. Alignment: Straightening with slight reversal of the normal lumbar lordosis. Trace retrolisthesis of L3 on L4 and L4 on L5, likely chronic and facet mediated. No malalignment. Vertebrae: Vertebral body heights maintained without evidence for acute or chronic fracture. No discrete lytic or blastic osseous lesions. Visualized sacrum and pelvis intact. SI joints approximated and symmetric. Paraspinal and other soft tissues: Paraspinous soft tissues demonstrate no acute finding. Disc levels: T12-L1: Disc desiccation with mild diffuse disc bulge.  No stenosis. L1-2: Chronic intervertebral disc space narrowing with disc desiccation and diffuse disc bulge. Right far lateral endplate osteophytic spurring. No significant stenosis. L2-3: Diffuse disc bulge with intervertebral disc space narrowing.  Prominent reactive endplate changes with endplate osteophytic spurring. Mild disc desiccation. Mild bilateral facet hypertrophy. Resultant mild spinal stenosis. Mild to moderate right L2 foraminal narrowing. L3-4: Diffuse disc bulge with disc desiccation and intervertebral disc space narrowing. Reactive endplate changes. Mild facet hypertrophy. Resultant moderate spinal stenosis. Foramina remain patent. L4-5: Diffuse disc bulge with associated annular calcification. Moderate facet hypertrophy. Resultant severe spinal stenosis. Mild to moderate bilateral L4 foraminal narrowing. L5-S1: Chronic intervertebral disc space narrowing with diffuse disc bulge and disc desiccation. Reactive endplate changes with marginal endplate osteophytic spurring. No significant spinal stenosis. Advanced bilateral L5 foraminal narrowing. IMPRESSION: CT ABDOMEN AND PELVIS IMPRESSION 1. Postoperative changes from recent right hemicolectomy with partial small bowel resection. Diffuse small bowel dilatation to the level of the surgical anastomosis favored to reflect postoperative ileus, although an obstructive process would be difficult to exclude, and could be considered in the correct clinical setting. 2. Small moderate volume free fluid within the abdomen and pelvis with persistent small volume free intraperitoneal air. Findings are somewhat greater than expected for normal expected postoperative changes given the patient is postoperative day 10 from prior surgery. Findings raise the possibility for an enteric leak, although source is not definitely visualized on this exam. No loculated collections or intra-abdominal abscess identified. 3. Pulmonary nodules at the left lung base as above, slightly increased in size, suggesting mildly progressed disease. Scattered mediastinal and upper abdominal adenopathy stable to slightly increased in size as well. 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 4. Aortic  atherosclerosis. CT LUMBAR SPINE IMPRESSION 1. No CT evidence for acute abnormality within the lumbar spine. 2. 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 3. 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 4. No evidence for osseous metastatic disease. Results were discussed by telephone at the time of interpretation on 03/27/2019 at 11:57 pm with Dr. Lennice Sites. Electronically Signed   By: Jeannine Boga M.D.   On: 03/27/2019 23:59   . 0.9 % NaCl with KCl 20 mEq / L 125 mL/hr at 03/28/19 0600   Prior to Admission medications   Medication Sig Start Date End Date Taking? Authorizing Provider  Alpha-D-Galactosidase (BEANO PO) Take 1 tablet by mouth daily as needed (gas).   Yes [provider]  Aspirin-Salicylamide-Caffeine (BC HEADACHE POWDER PO) Take 1 packet by mouth 3 (three) times daily as needed (headache, pain).   Yes [provider]  traMADol (ULTRAM) 50 MG tablet Take 1 tablet (50 mg total) by mouth every 6 (six) hours as needed for moderate pain or severe pain. 03/26/19  Yes Coralie Keens, MD  Pain control yesterday: Morphine 6 mg IV 2039; Dilaudid 1 mg this AM     Assessment/Plan Symptomatic anemia BPH/urinary retention - 800cc on bladder scan Ongoing marijuana use Fall with abdominal pain after marijuana use Possible fluid overload Hypokalemia - being replaced H/o CVA HTN HLD H/o metastatic melanoma - followed by Dr. Mckinley Jewel Hx chronic back pain Hx opiate addiction Hx tobacco use -none x 23 years Hx marijuana use Hx EtOH use  Metastatic malignant melanoma/multiple foci lymphovascular invasion was identified; metastatic melanoma in 1 of 21 lymph nodes Right hemicolectomy with small bowel resection 03/17/2019 Dr. Rolm Bookbinder  FEN: IV fluids/n.p.o. ID:  None DVT: Lovenox Son Real - [phone]  Plan: Patient remains  tachycardic, although his blood pressure stable.  Rechecking his CBC but will plan to transfuse.  We will also insert a Foley and start him on some Flomax.  We will also add plain Tylenol to his pain medication regime.  He may also have some fluid overload. Pt having a BM now.  We have ask Medicine and Oncology to see and assist with care.     Earnstine Regal [facility] Surgery 03/28/2019, 9:17 AM Pager: [phone]

## 2019-03-29 ENCOUNTER — Telehealth: Payer: Self-pay | Admitting: Medical Oncology

## 2019-03-29 DIAGNOSIS — I10 Essential (primary) hypertension: Secondary | ICD-10-CM | POA: Diagnosis present

## 2019-03-29 DIAGNOSIS — K9189 Other postprocedural complications and disorders of digestive system: Secondary | ICD-10-CM

## 2019-03-29 DIAGNOSIS — C787 Secondary malignant neoplasm of liver and intrahepatic bile duct: Secondary | ICD-10-CM | POA: Diagnosis present

## 2019-03-29 DIAGNOSIS — K56609 Unspecified intestinal obstruction, unspecified as to partial versus complete obstruction: Secondary | ICD-10-CM | POA: Diagnosis not present

## 2019-03-29 DIAGNOSIS — M47816 Spondylosis without myelopathy or radiculopathy, lumbar region: Secondary | ICD-10-CM | POA: Diagnosis present

## 2019-03-29 DIAGNOSIS — K567 Ileus, unspecified: Secondary | ICD-10-CM | POA: Diagnosis present

## 2019-03-29 DIAGNOSIS — C499 Malignant neoplasm of connective and soft tissue, unspecified: Secondary | ICD-10-CM | POA: Diagnosis not present

## 2019-03-29 DIAGNOSIS — D509 Iron deficiency anemia, unspecified: Secondary | ICD-10-CM | POA: Diagnosis present

## 2019-03-29 DIAGNOSIS — Z9049 Acquired absence of other specified parts of digestive tract: Secondary | ICD-10-CM | POA: Diagnosis not present

## 2019-03-29 DIAGNOSIS — T8131XD Disruption of external operation (surgical) wound, not elsewhere classified, subsequent encounter: Secondary | ICD-10-CM | POA: Diagnosis not present

## 2019-03-29 DIAGNOSIS — D638 Anemia in other chronic diseases classified elsewhere: Secondary | ICD-10-CM | POA: Diagnosis not present

## 2019-03-29 DIAGNOSIS — E538 Deficiency of other specified B group vitamins: Secondary | ICD-10-CM | POA: Diagnosis present

## 2019-03-29 DIAGNOSIS — Y838 Other surgical procedures as the cause of abnormal reaction of the patient, or of later complication, without mention of misadventure at the time of the procedure: Secondary | ICD-10-CM | POA: Diagnosis present

## 2019-03-29 DIAGNOSIS — C785 Secondary malignant neoplasm of large intestine and rectum: Secondary | ICD-10-CM | POA: Diagnosis present

## 2019-03-29 DIAGNOSIS — R1084 Generalized abdominal pain: Secondary | ICD-10-CM | POA: Diagnosis present

## 2019-03-29 DIAGNOSIS — C781 Secondary malignant neoplasm of mediastinum: Secondary | ICD-10-CM | POA: Diagnosis not present

## 2019-03-29 DIAGNOSIS — N401 Enlarged prostate with lower urinary tract symptoms: Secondary | ICD-10-CM | POA: Diagnosis present

## 2019-03-29 DIAGNOSIS — E785 Hyperlipidemia, unspecified: Secondary | ICD-10-CM | POA: Diagnosis present

## 2019-03-29 DIAGNOSIS — E876 Hypokalemia: Secondary | ICD-10-CM | POA: Diagnosis present

## 2019-03-29 DIAGNOSIS — D630 Anemia in neoplastic disease: Secondary | ICD-10-CM | POA: Diagnosis present

## 2019-03-29 DIAGNOSIS — C771 Secondary and unspecified malignant neoplasm of intrathoracic lymph nodes: Secondary | ICD-10-CM | POA: Diagnosis present

## 2019-03-29 DIAGNOSIS — C7972 Secondary malignant neoplasm of left adrenal gland: Secondary | ICD-10-CM | POA: Diagnosis present

## 2019-03-29 DIAGNOSIS — Z87891 Personal history of nicotine dependence: Secondary | ICD-10-CM | POA: Diagnosis not present

## 2019-03-29 DIAGNOSIS — Z8673 Personal history of transient ischemic attack (TIA), and cerebral infarction without residual deficits: Secondary | ICD-10-CM | POA: Diagnosis not present

## 2019-03-29 DIAGNOSIS — K0889 Other specified disorders of teeth and supporting structures: Secondary | ICD-10-CM | POA: Diagnosis present

## 2019-03-29 DIAGNOSIS — C439 Malignant melanoma of skin, unspecified: Secondary | ICD-10-CM | POA: Diagnosis present

## 2019-03-29 DIAGNOSIS — C7801 Secondary malignant neoplasm of right lung: Secondary | ICD-10-CM | POA: Diagnosis not present

## 2019-03-29 DIAGNOSIS — N5089 Other specified disorders of the male genital organs: Secondary | ICD-10-CM | POA: Diagnosis present

## 2019-03-29 DIAGNOSIS — C784 Secondary malignant neoplasm of small intestine: Secondary | ICD-10-CM | POA: Diagnosis present

## 2019-03-29 DIAGNOSIS — R338 Other retention of urine: Secondary | ICD-10-CM | POA: Diagnosis present

## 2019-03-29 DIAGNOSIS — M48061 Spinal stenosis, lumbar region without neurogenic claudication: Secondary | ICD-10-CM | POA: Diagnosis present

## 2019-03-29 LAB — BASIC METABOLIC PANEL
Anion gap: 8 (ref 5–15)
BUN: 5 mg/dL — ABNORMAL LOW (ref 6–20)
CO2: 25 mmol/L (ref 22–32)
Calcium: 7.4 mg/dL — ABNORMAL LOW (ref 8.9–10.3)
Chloride: 103 mmol/L (ref 98–111)
Creatinine, Ser: 0.48 mg/dL — ABNORMAL LOW (ref 0.61–1.24)
GFR calc Af Amer: >60 mL/min (ref >60)
GFR calc non Af Amer: >60 mL/min (ref >60)
Glucose, Bld: 98 mg/dL (ref 70–99)
Potassium: 3.5 mmol/L (ref 3.5–5.1)
Sodium: 136 mmol/L (ref 135–145)

## 2019-03-29 LAB — TYPE AND SCREEN
ABO/RH(D): O POS
Antibody Screen: NEGATIVE
Unit division: 0

## 2019-03-29 LAB — BPAM RBC
Blood Product Expiration Date: 202005062359
ISSUE DATE / TIME: 202004271525
Unit Type and Rh: 5100

## 2019-03-29 LAB — CBC
HCT: 27.5 % — ABNORMAL LOW (ref 39.0–52.0)
Hemoglobin: 7.7 g/dL — ABNORMAL LOW (ref 13.0–17.0)
MCH: 20.6 pg — ABNORMAL LOW (ref 26.0–34.0)
MCHC: 28 g/dL — ABNORMAL LOW (ref 30.0–36.0)
MCV: 73.5 fL — ABNORMAL LOW (ref 80.0–100.0)
Platelets: 332 10*3/uL (ref 150–400)
RBC: 3.74 MIL/uL — ABNORMAL LOW (ref 4.22–5.81)
RDW: 25.8 % — ABNORMAL HIGH (ref 11.5–15.5)
WBC: 6.4 10*3/uL (ref 4.0–10.5)
nRBC: 0 % (ref 0.0–0.2)

## 2019-03-29 LAB — MAGNESIUM: Magnesium: 1.3 mg/dL — ABNORMAL LOW (ref 1.7–2.4)

## 2019-03-29 LAB — PHOSPHORUS: Phosphorus: 3.7 mg/dL (ref 2.5–4.6)

## 2019-03-29 MED ORDER — SODIUM CHLORIDE 0.9 % IV SOLN
500.0000 mg | Freq: Once | INTRAVENOUS | Status: AC
  Administered 2019-03-29: 500 mg via INTRAVENOUS
  Filled 2019-03-29: qty 2

## 2019-03-29 MED ORDER — PSYLLIUM 95 % PO PACK
1.0000 | PACK | Freq: Two times a day (BID) | ORAL | Status: DC
  Administered 2019-03-29 – 2019-03-30 (×3): 1 via ORAL
  Filled 2019-03-29 (×3): qty 1

## 2019-03-29 MED ORDER — ENSURE SURGERY PO LIQD
237.0000 mL | Freq: Two times a day (BID) | ORAL | Status: DC
  Administered 2019-03-29 – 2019-03-31 (×5): 237 mL via ORAL
  Filled 2019-03-29 (×6): qty 237

## 2019-03-29 MED ORDER — MAGNESIUM SULFATE 2 GM/50ML IV SOLN
2.0000 g | Freq: Once | INTRAVENOUS | Status: AC
  Administered 2019-03-29: 18:00:00 2 g via INTRAVENOUS
  Filled 2019-03-29: qty 50

## 2019-03-29 MED ORDER — BEANO PO TABS: 1.0000 | ORAL_TABLET | Freq: Every day | ORAL | Status: DC | PRN

## 2019-03-29 MED ORDER — SODIUM CHLORIDE 0.9 % IV SOLN
25.0000 mg | Freq: Once | INTRAVENOUS | Status: AC
  Administered 2019-03-29: 25 mg via INTRAVENOUS
  Filled 2019-03-29: qty 0.5

## 2019-03-29 MED ORDER — SIMETHICONE 40 MG/0.6ML PO SUSP
40.0000 mg | Freq: Four times a day (QID) | ORAL | Status: DC | PRN
  Filled 2019-03-29: qty 0.6

## 2019-03-29 MED ORDER — SACCHAROMYCES BOULARDII 250 MG PO CAPS
250.0000 mg | ORAL_CAPSULE | Freq: Two times a day (BID) | ORAL | Status: DC
  Administered 2019-03-29 – 2019-03-30 (×3): 250 mg via ORAL
  Filled 2019-03-29 (×3): qty 1

## 2019-03-29 NOTE — Progress Notes (Addendum)
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 Serita Grammes      Assessment  Ileus resolving  Metastatic melanoma status post small bowel and right colon resections of metastases causing obstruction.  [facility] Stay = 0 days)  Plan:  Medical evaluation and help appreciated.  Ileus seems to be resolving although somewhat erratic.  Advance diet gradually.  Follow off IV fluids.  Bolus and her transfusion as needed.  Symptomatic anemia in the setting of chronic anemia.  Improved after transfusion.  Patient probably could benefit from IV or at least oral iron administration.  Will order IV dextran & see what medicine feels about giving IV dextran while in the hospital.    Try and get up and mobilize.  Bowel regimen  Hypokalemia borderline.  Continue replacing and follow.   Check magnesium  Patient had intolerance of initial chemotherapy for metastatic melanoma managed by Dr. Earlie Server.  He was open to reconsidering other interventions but did not know what his options were.  Per operating surgeon, Dr. Donne Hazel, Dr. Julien Nordmann open to discussing with the patient as an outpatient  VTE prophylaxis- SCDs, etc  Mobilize as tolerated to help recovery  25 minutes spent in review, evaluation, examination, counseling, and coordination of care.  More than 50% of that time was spent in counseling.  03/29/2019    Subjective: (Chief complaint)  Had a lot of diarrhea.  Trying some thicker liquids.  A little bloated but not nauseated.  Objective:  Vital signs:  Vitals:   03/28/19 1800 03/28/19 2042 03/29/19 0123 03/29/19 0436  BP: 102/68 116/83 107/65 108/76  Pulse: (!) 110 (!) 112 (!) 109 (!) 103  Resp: 16 20 16 18   Temp: 98.3 F (36.8 C) 98.3 F (36.8 C) 98.6 F (37 C) 98.5 F (36.9 C)  TempSrc: Oral Oral Oral Oral  SpO2: 99% 98% 97% 97%  Weight:      Height:        Last BM Date: 03/28/19  Intake/Output   Yesterday:  04/27 0701 - 04/28 0700 In: 2052.2 [P.O.:300; I.V.:1417.7; Blood:334.5] Out: 3700 [Urine:3700] This shift:  Total I/O In: 240 [P.O.:240] Out: -   Bowel function:  Flatus: YES  BM:  YES  Drain: (No drain)   Physical Exam:  General: Pt awake/alert/oriented x4 in no acute distress.  Poor hygiene Eyes: PERRL, normal EOM.  Sclera clear.  No icterus Neuro: CN II-XII intact w/o focal sensory/motor deficits. Lymph: No head/neck/groin lymphadenopathy Psych:  No delerium/psychosis/paranoia HENT: Normocephalic,  Mucus membranes moist.  No thrush Neck: Supple, No tracheal deviation Chest: No chest wall pain w good excursion CV:  Pulses intact.  Regular rhythm MS: Normal AROM mjr joints.  No obvious deformity  Abdomen: Soft.  Mildy distended.  Nontender.  No evidence of peritonitis.  No incarcerated hernias. Midline incision  mostly closed except for 5 x 2 cm area of separation with mild granulation tissue  Ext:  No deformity.  No mjr edema.  No cyanosis Skin: No petechiae / purpura  Results:   Diagnosis Colon, segmental resection for tumor, right colon - METASTATIC MALIGNANT MELANOMA, MULTIPLE FOCI. - LYMPHOVASCULAR INVASION IS IDENTIFIED. - METASTATIC MELANOMA IN 1 OF 21 LYMPH NODES (1/21) - THE SURGICAL RESECTION MARGINS ARE NEGATIVE FOR TUMOR. - SEE COMMENT. Microscopic Comment There are multiple foci of metastatic melanoma involving the specimen, the largest of which spans 7.5 cm. Additional studies can be performed upon clinician request. (JBK:kh [id number]/[id number]) Enid Cutter MD Pathologist, Electronic Signature (Case signed 03/21/2019) Specimen Gerald Adkins and Clinical Information Specimen(s) Obtained: Colon, segmental resection for tumor, right colon Specimen Clinical Information bowel obstruction [rd] Gerald Adkins Received in formalin is a segment of intestine which includes 83 cm of terminal ileum and 36 cm of colon. The small intestine is dilated up to 4.5 cm in diameter; 4 cm from the proximal margin there is a 2 cm firm white serosal plaque. The mucosal surface at this site shows a 3.5 x 3 x 1.5 cm ulcerated tan-red sessile mass which has a solid tan-white to brown cut surface. In the proximal ascending colon at the level of the ileocecal valve, there is a 7.5 x 6.5 x 5.5 cm indurated tan-white polypoid mass which obstructs the lumen at the ileocecal valve. The cut surface of the mass is tan-white to brown. Adjacent to the mass there is a 5 cm firm white serosal plaque. The mucosa at the serosal plaque shows a 3 cm area of ulceration. The wall at this area shows solid tan-white to pale brown tumor measuring up to 3.5 cm in thickness. There are multiple additional firm white nodules present in the soft tissue at the level of the ileocecal 1 of 2 FINAL for Gerald Adkins, Gerald Adkins ([id number]) Gerald Adkins(continued) valve  which measure up to 2.5 cm in greatest dimension. The remainder of the mucosal surfaces are glistening and tan. There are twenty-two tan-yellow ovoid nodules grossly consistent with lymph nodes varying in size from 0.3 to 1.8 cm in greatest dimension. Sections are submitted in fifteen cassettes. A = proximal margin B = distal margin C-D = proximal ileal lesion E-G = large polypoid mass in proximal ascending colon H-I = sections from ulcerated lesion adjacent to polypoid mass J = satellite nodules in proximal ascending colon soft tissue K-O = lymph nodes. (GP:ah 03/18/19) Report signed out from the following location(s) Technical component and interpretation was performed  Labs: Results for orders placed or performed during the hospital encounter of 03/27/19 (from the past 48 hour(s))  Lipase, blood     Status: None   Collection Time: 03/27/19  8:11 PM  Result Value Ref Range   Lipase 43 11 - 51 U/L    Comment: Performed at [facility], [place] [address], [place], [place] [address]  Comprehensive metabolic panel     Status: Abnormal   Collection Time: 03/27/19  8:11 PM  Result Value Ref Range   Sodium 135 135 - 145 mmol/L   Potassium 3.0 (L) 3.5 - 5.1 mmol/L    Comment:  DELTA CHECK NOTED   Chloride 95 (L) 98 - 111 mmol/L   CO2 28 22 - 32 mmol/L   Glucose, Bld 116 (H) 70 - 99 mg/dL   BUN 9 6 - 20 mg/dL   Creatinine, Ser 0.62 0.61 - 1.24 mg/dL   Calcium 8.6 (L) 8.9 - 10.3 mg/dL   Total Protein 7.1 6.5 - 8.1 g/dL   Albumin 3.0 (L) 3.5 - 5.0 g/dL   AST 15 15 - 41 U/L   ALT 12 0 - 44 U/L   Alkaline Phosphatase 95 38 - 126 U/L   Total Bilirubin 0.7 0.3 - 1.2 mg/dL   GFR calc non Af Amer >60 >60 mL/min   GFR calc Af Amer >60 >60 mL/min   Anion gap 12 5 - 15    Comment: Performed at [facility], [place] [address] [place], [place] [address]  CBC     Status: Abnormal   Collection Time: 03/27/19  8:11 PM  Result Value Ref Range   WBC 10.0 4.0 - 10.5  K/uL   RBC 4.16 (L) 4.22 - 5.81 MIL/uL   Hemoglobin 8.2 (L) 13.0 - 17.0 g/dL    Comment: Reticulocyte Hemoglobin testing may be clinically indicated, consider ordering this additional test [id number]    HCT 29.5 (L) 39.0 - 52.0 %   MCV 70.9 (L) 80.0 - 100.0 fL   MCH 19.7 (L) 26.0 - 34.0 pg   MCHC 27.8 (L) 30.0 - 36.0 g/dL   RDW 25.8 (H) 11.5 - 15.5 %   Platelets 387 150 - 400 K/uL   nRBC 0.0 0.0 - 0.2 %    Comment: Performed at [facility], [place] [address] [place], [place] [address]  Urinalysis, Routine w reflex microscopic     Status: None   Collection Time: 03/28/19 12:12 AM  Result Value Ref Range   Color, Urine YELLOW YELLOW   APPearance CLEAR CLEAR   Specific Gravity, Urine 1.027 1.005 - 1.030   pH 5.0 5.0 - 8.0   Glucose, UA NEGATIVE NEGATIVE mg/dL   Hgb urine dipstick NEGATIVE NEGATIVE   Bilirubin Urine NEGATIVE NEGATIVE   Ketones, ur NEGATIVE NEGATIVE mg/dL   Protein, ur NEGATIVE NEGATIVE mg/dL   Nitrite NEGATIVE NEGATIVE   Leukocytes,Ua NEGATIVE NEGATIVE    Comment: Performed at [place] [address] [place], [place] [address]  Basic metabolic panel     Status: Abnormal   Collection Time: 03/28/19  7:56 AM  Result Value Ref Range   Sodium 135 135 - 145 mmol/L   Potassium 3.4 (L) 3.5 - 5.1 mmol/L   Chloride 104 98 - 111 mmol/L   CO2 24 22 - 32 mmol/L   Glucose, Bld 82 70 - 99 mg/dL   BUN 8 6 - 20 mg/dL   Creatinine, Ser 0.42 (L) 0.61 - 1.24 mg/dL   Calcium 7.2 (L) 8.9 - 10.3 mg/dL   GFR calc non Af Amer >60 >60 mL/min   GFR calc Af Amer >60 >60 mL/min   Anion gap 7 5 - 15    Comment: Performed at [facility], [place] [address], [place], [place] [address]  CBC     Status: Abnormal   Collection Time: 03/28/19  7:56 AM  Result Value Ref Range   WBC 5.6 4.0 - 10.5 K/uL   RBC 3.38 (L) 4.22 - 5.81 MIL/uL   Hemoglobin 6.7 (LL) 13.0 - 17.0 g/dL    Comment: REPEATED TO VERIFY Reticulocyte Hemoglobin  testing may be clinically indicated, consider ordering this additional test [id number] THIS CRITICAL RESULT HAS VERIFIED AND BEEN CALLED TO S.NORRIS RN BY SARA VANHOORNE ON 04 27 2020 AT 0805, AND HAS BEEN READ BACK.     HCT 24.2 (L) 39.0 - 52.0 %   MCV 71.6 (L) 80.0 - 100.0 fL   MCH 19.8 (L) 26.0 - 34.0 pg   MCHC 27.7 (L) 30.0 - 36.0 g/dL   RDW 25.6 (H) 11.5 - 15.5 %   Platelets 326 150 - 400 K/uL   nRBC 0.0 0.0 - 0.2 %    Comment: Performed at [facility], [place] [address] [place], [place] [address]  CBC     Status: Abnormal   Collection Time: 03/28/19 10:34 AM  Result Value Ref Range   WBC 6.0 4.0 - 10.5 K/uL   RBC 3.53 (L) 4.22 - 5.81 MIL/uL   Hemoglobin 7.0 (L) 13.0 - 17.0 g/dL    Comment: Reticulocyte Hemoglobin testing may be clinically indicated, consider ordering this additional test [id number]    HCT 25.2 (L) 39.0 - 52.0 %   MCV 71.4 (L) 80.0 - 100.0 fL   MCH 19.8 (L) 26.0 - 34.0 pg   MCHC 27.8 (L) 30.0 - 36.0 g/dL   RDW 25.8 (H) 11.5 - 15.5 %   Platelets 336 150 - 400 K/uL   nRBC 0.0 0.0 - 0.2 %    Comment: Performed at [facility], [place] [address], [place], [place] [address]  Type and screen [place]     Status: None   Collection Time: 03/28/19 10:34 AM  Result Value Ref Range   ABO/RH(D) O POS    Antibody Screen NEG    Sample Expiration 03/31/2019    Unit Number [id number]    Blood Component Type RBC LR PHER1    Unit division 00    Status of Unit ISSUED,FINAL    Transfusion Status OK TO TRANSFUSE    Crossmatch Result      Compatible Performed at [facility], [place] [address] [place], [place] [address]   Iron and TIBC     Status: Abnormal   Collection Time: 03/28/19 11:35 AM  Result Value Ref Range   Iron 6 (L) 45 - 182 ug/dL   TIBC 203 (L) 250 - 450 ug/dL   Saturation Ratios 3 (L) 17.9 - 39.5 %   UIBC 197 ug/dL    Comment: Performed at [facility], [place] [address] [place], [place] [address]  Ferritin     Status: Abnormal   Collection Time: 03/28/19 11:35 AM  Result Value Ref Range   Ferritin 16 (L) 24 - 336 ng/mL    Comment: Performed at [facility], [place] [address] [place], [place] [address]  Folate     Status: Abnormal   Collection Time: 03/28/19 11:35 AM  Result Value Ref Range   Folate 5.4 (L) >5.9 ng/mL    Comment: Performed at [facility], [place] [address], [place], [place] [address]  Vitamin B12     Status: None   Collection Time: 03/28/19 11:35 AM  Result Value Ref Range   Vitamin B-12 278 180 - 914 pg/mL    Comment: (NOTE) This assay is not validated for testing neonatal or myeloproliferative syndrome specimens for Vitamin B12 levels. Performed at [facility], [place] [address], [place], [place] [address]   Troponin I - Add-On to previous collection  Status: None   Collection Time: 03/28/19  1:23 PM  Result Value Ref Range   Troponin I <0.03 <0.03 ng/mL    Comment: Performed at [facility], [place] [address], [place], [place] [address]  Reticulocytes     Status: Abnormal   Collection Time: 03/28/19  1:23 PM  Result Value Ref Range   Retic Ct Pct 0.9 0.4 - 3.1 %   RBC. 3.58 (L) 4.22 - 5.81 MIL/uL   Retic Count, Absolute 31.5 19.0 - 186.0 K/uL   Immature Retic Fract 25.0 (H) 2.3 - 15.9 %    Comment: Performed at [facility], [place] [address], [place], [place] [address]  Prepare RBC     Status: None   Collection Time: 03/28/19  1:23 PM  Result Value Ref Range   Order Confirmation      ORDER PROCESSED BY BLOOD BANK Performed at [place] [address] [place], [place] [address]   CBC     Status: Abnormal   Collection Time: 03/29/19  4:36 AM  Result Value Ref Range   WBC 6.4 4.0 - 10.5 K/uL   RBC 3.74 (L) 4.22 - 5.81 MIL/uL   Hemoglobin 7.7 (L) 13.0 - 17.0 g/dL    Comment: Reticulocyte Hemoglobin  testing may be clinically indicated, consider ordering this additional test [id number]    HCT 27.5 (L) 39.0 - 52.0 %   MCV 73.5 (L) 80.0 - 100.0 fL   MCH 20.6 (L) 26.0 - 34.0 pg   MCHC 28.0 (L) 30.0 - 36.0 g/dL   RDW 25.8 (H) 11.5 - 15.5 %   Platelets 332 150 - 400 K/uL   nRBC 0.0 0.0 - 0.2 %    Comment: Performed at [facility], [place] [address] [place], [place] [address]  Basic metabolic panel     Status: Abnormal   Collection Time: 03/29/19  4:36 AM  Result Value Ref Range   Sodium 136 135 - 145 mmol/L   Potassium 3.5 3.5 - 5.1 mmol/L   Chloride 103 98 - 111 mmol/L   CO2 25 22 - 32 mmol/L   Glucose, Bld 98 70 - 99 mg/dL   BUN 5 (L) 6 - 20 mg/dL   Creatinine, Ser 0.48 (L) 0.61 - 1.24 mg/dL   Calcium 7.4 (L) 8.9 - 10.3 mg/dL   GFR calc non Af Amer >60 >60 mL/min   GFR calc Af Amer >60 >60 mL/min   Anion gap 8 5 - 15    Comment: Performed at [facility], [place] [address], [place],  [address]    Imaging / Studies: Dg Chest 2 View  Result Date: 03/28/2019 CLINICAL DATA:  Postoperative state. EXAM: CHEST - 2 VIEW COMPARISON:  CT scan and radiographs of August 30, 2018. FINDINGS: Stable cardiomegaly. Right paratracheal mass is significantly smaller. No pneumothorax is noted. Elevated right hemidiaphragm is noted. Left lung is clear. Bony thorax is unremarkable. IMPRESSION: Right paratracheal mass noted on prior exam is significantly smaller. Elevated right hemidiaphragm is noted with probable small right pleural effusion. No other significant abnormality seen in the chest. Electronically Signed   By: Marijo Conception M.D.   On: 03/28/2019 13:06   Ct Abdomen Pelvis W Contrast  Result Date: 03/27/2019 CLINICAL DATA:  Initial evaluation for acute abdominal and back pain. EXAM: CT ABDOMEN AND PELVIS WITHOUT CONTRAST TECHNIQUE: Multidetector CT imaging of the abdomen and pelvis was performed following the standard protocol without IV contrast.  COMPARISON:  Comparison made with prior CT from 03/16/2019. FINDINGS: CT ABDOMEN AND PELVIS Lower chest: Small layering right pleural effusion with associated atelectasis. Probable postradiation changes partially visualize within the right perihilar region, grossly similar. 2.8 cm pleural base nodule at the lingula is increased in size (series 6, image 21). Additional 15 mm left lower lobe nodule also increased in size (series 6, image 25). 12 mm node noted near the AE recess, similar. Right cardiophrenic implant measures up to 2.6 cm, perhaps slightly increased in size from previous. Trace pericardial effusion noted. Hepatobiliary: 2.3 cm hypodense lesion at the right hepatic dome noted, stable, concerning for metastatic disease. Additional 15 mm lesion slightly inferiorly within the right hepatic lobe also similar. Additional subcentimeter hypodensity within the right hepatic lobe too small the characterize and could reflect a small cyst versus metastatic implant, stable from previous. Gallbladder surgically absent. Mild scattered intrahepatic biliary dilatation similar to previous. Pancreas: Pancreas within normal limits. Spleen: Spleen within normal limits. Adrenals/Urinary Tract: Right adrenal gland is normal. 19 mm left adrenal nodule noted, stable. Kidneys equal in size with symmetric enhancement. No nephrolithiasis, hydronephrosis or focal enhancing renal mass. No hydroureter. Bladder within normal limits. Stomach/Bowel: Stomach largely decompressed without acute finding. Postoperative changes from interval right hemicolectomy with partial small bowel resection and ileocolonic anastomosis. Small bowel is diffusely dilated and fluid-filled to the level of the surgical anastomosis within the right lower quadrant, measuring up to 5 cm in maximal diameter. Few scattered internal air-fluid levels. Mild gaseous distension of the colon. Imaging findings favored to reflect postoperative ileus. Few scattered colonic  diverticula noted without evidence for acute diverticulitis. Small to moderate volume free fluid present within the abdomen and pelvis, measuring simple fluid density. Additionally, scattered small volume free air seen within the abdomen, somewhat greater than expected for approximately 10 days post surgery. Findings raise the possibility for an ongoing leak, source not identified. No discrete loculated collections or intra-abdominal abscess. Vascular/Lymphatic: Moderate to advanced aorto bi-iliac atherosclerotic disease. Infrarenal aorta ectatic measuring up to 2.8 cm in transverse diameter. Mesenteric vessels patent proximally. Enlarged porta hepatis lymph nodes again seen, measuring up to 2.4 cm in maximal diameter, perhaps slightly increased in size from previous. No other new adenopathy. Reproductive: Prostate normal. Other: Focal skin breakdown noted along the midline incision. No loculated collections or other complication along the surgical incision itself. Mild diffuse anasarca noted. Musculoskeletal: No acute osseous abnormality. No discrete lytic or blastic osseous lesions. CT LUMBAR SPINE: Segmentation: Standard. Lowest well-formed disc labeled the L5-S1 level. Alignment: Straightening with slight reversal of the normal lumbar lordosis. Trace retrolisthesis of L3 on L4 and L4 on L5, likely chronic and facet mediated. No malalignment. Vertebrae: Vertebral body heights maintained without evidence for acute or chronic fracture. No discrete lytic or blastic osseous lesions. Visualized sacrum and pelvis intact. SI joints approximated and symmetric. Paraspinal and other soft tissues: Paraspinous soft tissues demonstrate no acute finding. Disc levels: T12-L1: Disc desiccation with mild diffuse disc bulge.  No stenosis. L1-2: Chronic intervertebral disc space narrowing with disc desiccation and diffuse disc bulge. Right far lateral endplate osteophytic spurring. No significant stenosis. L2-3: Diffuse disc bulge  with intervertebral disc space narrowing. Prominent reactive endplate changes with endplate osteophytic spurring. Mild disc desiccation. Mild bilateral facet hypertrophy. Resultant mild spinal stenosis. Mild to moderate right L2 foraminal narrowing. L3-4: Diffuse disc bulge with disc desiccation and intervertebral disc space narrowing. Reactive endplate changes. Mild facet hypertrophy. Resultant moderate spinal stenosis. Foramina remain patent. L4-5:  Diffuse disc bulge with associated annular calcification. Moderate facet hypertrophy. Resultant severe spinal stenosis. Mild to moderate bilateral L4 foraminal narrowing. L5-S1: Chronic intervertebral disc space narrowing with diffuse disc bulge and disc desiccation. Reactive endplate changes with marginal endplate osteophytic spurring. No significant spinal stenosis. Advanced bilateral L5 foraminal narrowing. IMPRESSION: CT ABDOMEN AND PELVIS IMPRESSION 1. Postoperative changes from recent right hemicolectomy with partial small bowel resection. Diffuse small bowel dilatation to the level of the surgical anastomosis favored to reflect postoperative ileus, although an obstructive process would be difficult to exclude, and could be considered in the correct clinical setting. 2. Small moderate volume free fluid within the abdomen and pelvis with persistent small volume free intraperitoneal air. Findings are somewhat greater than expected for normal expected postoperative changes given the patient is postoperative day 10 from prior surgery. Findings raise the possibility for an enteric leak, although source is not definitely visualized on this exam. No loculated collections or intra-abdominal abscess identified. 3. Pulmonary nodules at the left lung base as above, slightly increased in size, suggesting mildly progressed disease. Scattered mediastinal and upper abdominal adenopathy stable to slightly increased in size as well. Probable intrahepatic and left adrenal  metastases not significantly changed. 4. Aortic atherosclerosis. CT LUMBAR SPINE IMPRESSION 1. No CT evidence for acute abnormality within the lumbar spine. 2. 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 3. 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 4. No evidence for osseous metastatic disease. Results were discussed by telephone at the time of interpretation on 03/27/2019 at 11:57 pm with Dr. Lennice Sites. Electronically Signed   By: Jeannine Boga M.D.   On: 03/27/2019 23:59   Ct L-spine No Charge  Result Date: 03/27/2019 CLINICAL DATA:  Initial evaluation for acute abdominal and back pain. EXAM: CT ABDOMEN AND PELVIS WITHOUT CONTRAST TECHNIQUE: Multidetector CT imaging of the abdomen and pelvis was performed following the standard protocol without IV contrast. COMPARISON:  Comparison made with prior CT from 03/16/2019. FINDINGS: CT ABDOMEN AND PELVIS Lower chest: Small layering right pleural effusion with associated atelectasis. Probable postradiation changes partially visualize within the right perihilar region, grossly similar. 2.8 cm pleural base nodule at the lingula is increased in size (series 6, image 21). Additional 15 mm left lower lobe nodule also increased in size (series 6, image 25). 12 mm node noted near the AE recess, similar. Right cardiophrenic implant measures up to 2.6 cm, perhaps slightly increased in size from previous. Trace pericardial effusion noted. Hepatobiliary: 2.3 cm hypodense lesion at the right hepatic dome noted, stable, concerning for metastatic disease. Additional 15 mm lesion slightly inferiorly within the right hepatic lobe also similar. Additional subcentimeter hypodensity within the right hepatic lobe too small the characterize and could reflect a small cyst versus metastatic implant, stable from previous. Gallbladder surgically absent. Mild  scattered intrahepatic biliary dilatation similar to previous. Pancreas: Pancreas within normal limits. Spleen: Spleen within normal limits. Adrenals/Urinary Tract: Right adrenal gland is normal. 19 mm left adrenal nodule noted, stable. Kidneys equal in size with symmetric enhancement. No nephrolithiasis, hydronephrosis or focal enhancing renal mass. No hydroureter. Bladder within normal limits. Stomach/Bowel: Stomach largely decompressed without acute finding. Postoperative changes from interval right hemicolectomy with partial small bowel resection and ileocolonic anastomosis. Small bowel is diffusely dilated and fluid-filled to the level of the surgical anastomosis within the right lower quadrant, measuring up to 5 cm in maximal diameter. Few scattered internal air-fluid levels. Mild gaseous distension  of the colon. Imaging findings favored to reflect postoperative ileus. Few scattered colonic diverticula noted without evidence for acute diverticulitis. Small to moderate volume free fluid present within the abdomen and pelvis, measuring simple fluid density. Additionally, scattered small volume free air seen within the abdomen, somewhat greater than expected for approximately 10 days post surgery. Findings raise the possibility for an ongoing leak, source not identified. No discrete loculated collections or intra-abdominal abscess. Vascular/Lymphatic: Moderate to advanced aorto bi-iliac atherosclerotic disease. Infrarenal aorta ectatic measuring up to 2.8 cm in transverse diameter. Mesenteric vessels patent proximally. Enlarged porta hepatis lymph nodes again seen, measuring up to 2.4 cm in maximal diameter, perhaps slightly increased in size from previous. No other new adenopathy. Reproductive: Prostate normal. Other: Focal skin breakdown noted along the midline incision. No loculated collections or other complication along the surgical incision itself. Mild diffuse anasarca noted. Musculoskeletal: No acute  osseous abnormality. No discrete lytic or blastic osseous lesions. CT LUMBAR SPINE: Segmentation: Standard. Lowest well-formed disc labeled the L5-S1 level. Alignment: Straightening with slight reversal of the normal lumbar lordosis. Trace retrolisthesis of L3 on L4 and L4 on L5, likely chronic and facet mediated. No malalignment. Vertebrae: Vertebral body heights maintained without evidence for acute or chronic fracture. No discrete lytic or blastic osseous lesions. Visualized sacrum and pelvis intact. SI joints approximated and symmetric. Paraspinal and other soft tissues: Paraspinous soft tissues demonstrate no acute finding. Disc levels: T12-L1: Disc desiccation with mild diffuse disc bulge.  No stenosis. L1-2: Chronic intervertebral disc space narrowing with disc desiccation and diffuse disc bulge. Right far lateral endplate osteophytic spurring. No significant stenosis. L2-3: Diffuse disc bulge with intervertebral disc space narrowing. Prominent reactive endplate changes with endplate osteophytic spurring. Mild disc desiccation. Mild bilateral facet hypertrophy. Resultant mild spinal stenosis. Mild to moderate right L2 foraminal narrowing. L3-4: Diffuse disc bulge with disc desiccation and intervertebral disc space narrowing. Reactive endplate changes. Mild facet hypertrophy. Resultant moderate spinal stenosis. Foramina remain patent. L4-5: Diffuse disc bulge with associated annular calcification. Moderate facet hypertrophy. Resultant severe spinal stenosis. Mild to moderate bilateral L4 foraminal narrowing. L5-S1: Chronic intervertebral disc space narrowing with diffuse disc bulge and disc desiccation. Reactive endplate changes with marginal endplate osteophytic spurring. No significant spinal stenosis. Advanced bilateral L5 foraminal narrowing. IMPRESSION: CT ABDOMEN AND PELVIS IMPRESSION 1. Postoperative changes from recent right hemicolectomy with partial small bowel resection. Diffuse small bowel  dilatation to the level of the surgical anastomosis favored to reflect postoperative ileus, although an obstructive process would be difficult to exclude, and could be considered in the correct clinical setting. 2. Small moderate volume free fluid within the abdomen and pelvis with persistent small volume free intraperitoneal air. Findings are somewhat greater than expected for normal expected postoperative changes given the patient is postoperative day 10 from prior surgery. Findings raise the possibility for an enteric leak, although source is not definitely visualized on this exam. No loculated collections or intra-abdominal abscess identified. 3. Pulmonary nodules at the left lung base as above, slightly increased in size, suggesting mildly progressed disease. Scattered mediastinal and upper abdominal adenopathy stable to slightly increased in size as well. 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 4. Aortic atherosclerosis. CT LUMBAR SPINE IMPRESSION 1. No CT evidence for acute abnormality within the lumbar spine. 2. 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 3. 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 4. No evidence for osseous metastatic disease. Results  were discussed by telephone at the time of interpretation on 03/27/2019 at 11:57 pm with Dr. Lennice Sites. Electronically Signed   By: Jeannine Boga M.D.   On: 03/27/2019 23:59    Medications / Allergies: per chart  Antibiotics: Anti-infectives (From admission, onward)   None        Note: Portions of this report may have been transcribed using voice recognition software. Every effort was made to ensure accuracy; however, inadvertent computerized transcription errors may be present.   Any transcriptional errors that result from this process are unintentional.     Adin Hector, MD, FACS, MASCRS [facility]    [address] [place], [place] [address] [phone] Main / Paging [phone] Fax

## 2019-03-29 NOTE — Progress Notes (Signed)
PROGRESS NOTE    Gerald Adkins  OQH:[id number] DOB: 07-02-1956 DOA: 03/27/2019 PCP: [name], MD     Brief Narrative:  Gerald Adkins is a 63 y.o. male with history of malignant melanoma, chronic back pain, hypertension, hyperlipidemia, stroke, remote history of opiate addiction. On 4/15, patient was admitted for intussusception of bowel secondary to metastatic melanoma.  On 4/16, he underwent right hemicolectomy and small bowel resection.  Surgical pathology confirmed metastatic melanoma with 1/21 lymph nodes positive.  He had significant postoperative ileus.  But bowel function eventually returned, his diet was slowly advanced and he was discharged home 3 days ago on 4/24. Apparently, he tripped over rug at home leading to fall around 5:30 PM yesterday 4/27 leading to severe abdominal pain and hence return to the ED.   He was admitted by general surgery team and [facility] consulted to manage medical conditions.   New events last 24 hours / Subjective: His main complaint is that it takes too long for staff to get to his room when he needed to use the bedpan. He also complains about his foot being uncovered by blanket. States his abdomen feels better than yesterday, had BM yesterday.   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  -Per primary service   Sinus tachycardia  -Patient has been consistently tachycardic between 100 to 20. Not septic, not  hypotensive.  Could be related to significant degree of anemia.  -EKG shows sinus tachycardia. -Telemetry   Acute on chronic anemia, Iron deficiency anemia -Baseline hemoglobin 8-9 from 2019. Since last admission on 4/15, hemoglobin has been been running between 7-8 requiring multiple transfusions. Transfused pRBC 4/27. -Agree with dextran  -Trend CBC   Folate deficiency -Continue folic acid PO  Hypokalemia -Replace, trend   Hypomagnesemia -Replace, trend   Malignant melanoma -Followed by Dr. Julien Nordmann     Antimicrobials:  Anti-infectives (From admission, onward)   None       Objective: Vitals:   03/28/19 1800 03/28/19 2042 03/29/19 0123 03/29/19 0436  BP: 102/68 116/83 107/65 108/76  Pulse: (!) 110 (!) 112 (!) 109 (!) 103  Resp: 16 20 16 18   Temp: 98.3 F (36.8 C) 98.3 F (36.8 C) 98.6 F (37 C) 98.5 F (36.9 C)  TempSrc: Oral Oral Oral Oral  SpO2: 99% 98% 97% 97%  Weight:      Height:        Intake/Output Summary (Last 24 hours) at 03/29/2019 1234 Last data filed at 03/29/2019 1233 Gross per 24 hour  Intake 1972.34 ml  Output 4050 ml  Net -2077.66 ml   Filed Weights   03/27/19 1838  Weight: 77.1 kg    Examination:  General exam: Nontoxic Respiratory system: Clear to auscultation. Respiratory effort normal. Cardiovascular system: S1 & S2 heard, RRR. No JVD, murmurs, rubs, gallops or clicks. No pedal edema. Gastrointestinal system: Abdomen is nondistended, soft and nontender. Incision covered with dry clean dressing. +bowel sounds heard. Central nervous system:  Alert and oriented. No focal neurological deficits. Extremities: Left upper extremity deformed  Skin: No rashes, lesions or ulcers Psychiatry: Judgement and insight appear stable   Data Reviewed: I have personally reviewed following labs and imaging studies  CBC: Recent Labs  Lab 03/26/19 0340 03/27/19 2011 03/28/19 0756 03/28/19 1034 03/29/19 0436  WBC 7.1 10.0 5.6 6.0 6.4  HGB  7.0* 8.2* 6.7* 7.0* 7.7*  HCT 26.4* 29.5* 24.2* 25.2* 27.5*  MCV 72.5* 70.9* 71.6* 71.4* 73.5*  PLT 330 387 326 336 374   Basic Metabolic Panel: Recent Labs  Lab 03/24/19 0929 03/26/19 0340 03/27/19 2011 03/28/19 0756 03/29/19 0436  NA 137 133* 135 135 136  K 3.6 4.2 3.0* 3.4* 3.5  CL 103 100 95* 104 103  CO2 24 27 28 24 25   GLUCOSE 119* 121* 116* 82 98  BUN 6 6 9 8  5*  CREATININE 0.54* 0.53* 0.62 0.42* 0.48*  CALCIUM 8.2* 7.7* 8.6* 7.2* 7.4*  MG  --   --   --   --  1.3*  PHOS  --   --   --   --  3.7   GFR: Estimated Creatinine Clearance: 107.1 mL/min (A) (by C-G formula based on SCr of 0.48 mg/dL (L)). Liver Function Tests: Recent Labs  Lab 03/27/19 2011  AST 15  ALT 12  ALKPHOS 95  BILITOT 0.7  PROT 7.1  ALBUMIN 3.0*   Recent Labs  Lab 03/27/19 2011  LIPASE 43   No results for input(s): AMMONIA in the last 168 hours. Coagulation Profile: No results for input(s): INR, PROTIME in the last 168 hours. Cardiac Enzymes: Recent Labs  Lab 03/28/19 1323  TROPONINI <0.03   BNP (last 3 results) No results for input(s): PROBNP in the last 8760 hours. HbA1C: No results for input(s): HGBA1C in the last 72 hours. CBG: No results for input(s): GLUCAP in the last 168 hours. Lipid Profile: No results for input(s): CHOL, HDL, LDLCALC, TRIG, CHOLHDL, LDLDIRECT in the last 72 hours. Thyroid Function Tests: No results for input(s): TSH, T4TOTAL, FREET4, T3FREE, THYROIDAB in the last 72 hours. Anemia Panel: Recent Labs    03/28/19 1135 03/28/19 1323  VITAMINB12 278  --   FOLATE 5.4*  --   FERRITIN 16*  --   TIBC 203*  --   IRON 6*  --   RETICCTPCT  --  0.9   Sepsis Labs: No results for input(s): PROCALCITON, LATICACIDVEN in the last 168 hours.  No results found for this or any previous visit (from the past 240 hour(s)).     Radiology Studies: Dg Chest 2 View  Result Date: 03/28/2019 CLINICAL DATA:  Postoperative state. EXAM: CHEST - 2 VIEW COMPARISON:  CT  scan and radiographs of August 30, 2018. FINDINGS: Stable cardiomegaly. Right paratracheal mass is significantly smaller. No pneumothorax is noted. Elevated right hemidiaphragm is noted. Left lung is clear. Bony thorax is unremarkable. IMPRESSION: Right paratracheal mass noted on prior exam is significantly smaller. Elevated right hemidiaphragm is noted with probable small right pleural effusion. No other significant abnormality seen in the chest. Electronically Signed   By: Marijo Conception M.D.   On: 03/28/2019 13:06   Ct Abdomen Pelvis W Contrast  Result Date: 03/27/2019 CLINICAL DATA:  Initial evaluation for acute abdominal and back pain. EXAM: CT ABDOMEN AND PELVIS WITHOUT CONTRAST TECHNIQUE: Multidetector CT imaging of the abdomen and pelvis was performed following the standard protocol without IV contrast. COMPARISON:  Comparison made with prior CT from 03/16/2019. FINDINGS:  CT ABDOMEN AND PELVIS Lower chest: Small layering right pleural effusion with associated atelectasis. Probable postradiation changes partially visualize within the right perihilar region, grossly similar. 2.8 cm pleural base nodule at the lingula is increased in size (series 6, image 21). Additional 15 mm left lower lobe nodule also increased in size (series 6, image 25). 12 mm node noted near the AE recess, similar. Right cardiophrenic implant measures up to 2.6 cm, perhaps slightly increased in size from previous. Trace pericardial effusion noted. Hepatobiliary: 2.3 cm hypodense lesion at the right hepatic dome noted, stable, concerning for metastatic disease. Additional 15 mm lesion slightly inferiorly within the right hepatic lobe also similar. Additional subcentimeter hypodensity within the right hepatic lobe too small the characterize and could reflect a small cyst versus metastatic implant, stable from previous. Gallbladder surgically absent. Mild scattered intrahepatic biliary dilatation similar to previous. Pancreas:  Pancreas within normal limits. Spleen: Spleen within normal limits. Adrenals/Urinary Tract: Right adrenal gland is normal. 19 mm left adrenal nodule noted, stable. Kidneys equal in size with symmetric enhancement. No nephrolithiasis, hydronephrosis or focal enhancing renal mass. No hydroureter. Bladder within normal limits. Stomach/Bowel: Stomach largely decompressed without acute finding. Postoperative changes from interval right hemicolectomy with partial small bowel resection and ileocolonic anastomosis. Small bowel is diffusely dilated and fluid-filled to the level of the surgical anastomosis within the right lower quadrant, measuring up to 5 cm in maximal diameter. Few scattered internal air-fluid levels. Mild gaseous distension of the colon. Imaging findings favored to reflect postoperative ileus. Few scattered colonic diverticula noted without evidence for acute diverticulitis. Small to moderate volume free fluid present within the abdomen and pelvis, measuring simple fluid density. Additionally, scattered small volume free air seen within the abdomen, somewhat greater than expected for approximately 10 days post surgery. Findings raise the possibility for an ongoing leak, source not identified. No discrete loculated collections or intra-abdominal abscess. Vascular/Lymphatic: Moderate to advanced aorto bi-iliac atherosclerotic disease. Infrarenal aorta ectatic measuring up to 2.8 cm in transverse diameter. Mesenteric vessels patent proximally. Enlarged porta hepatis lymph nodes again seen, measuring up to 2.4 cm in maximal diameter, perhaps slightly increased in size from previous. No other new adenopathy. Reproductive: Prostate normal. Other: Focal skin breakdown noted along the midline incision. No loculated collections or other complication along the surgical incision itself. Mild diffuse anasarca noted. Musculoskeletal: No acute osseous abnormality. No discrete lytic or blastic osseous lesions. CT LUMBAR  SPINE: Segmentation: Standard. Lowest well-formed disc labeled the L5-S1 level. Alignment: Straightening with slight reversal of the normal lumbar lordosis. Trace retrolisthesis of L3 on L4 and L4 on L5, likely chronic and facet mediated. No malalignment. Vertebrae: Vertebral body heights maintained without evidence for acute or chronic fracture. No discrete lytic or blastic osseous lesions. Visualized sacrum and pelvis intact. SI joints approximated and symmetric. Paraspinal and other soft tissues: Paraspinous soft tissues demonstrate no acute finding. Disc levels: T12-L1: Disc desiccation with mild diffuse disc bulge.  No stenosis. L1-2: Chronic intervertebral disc space narrowing with disc desiccation and diffuse disc bulge. Right far lateral endplate osteophytic spurring. No significant stenosis. L2-3: Diffuse disc bulge with intervertebral disc space narrowing. Prominent reactive endplate changes with endplate osteophytic spurring. Mild disc desiccation. Mild bilateral facet hypertrophy. Resultant mild spinal stenosis. Mild to moderate right L2 foraminal narrowing. L3-4: Diffuse disc bulge with disc desiccation and intervertebral disc space narrowing. Reactive endplate changes. Mild facet hypertrophy. Resultant moderate spinal stenosis. Foramina remain patent. L4-5: Diffuse disc bulge with associated annular calcification. Moderate facet hypertrophy.  Resultant severe spinal stenosis. Mild to moderate bilateral L4 foraminal narrowing. L5-S1: Chronic intervertebral disc space narrowing with diffuse disc bulge and disc desiccation. Reactive endplate changes with marginal endplate osteophytic spurring. No significant spinal stenosis. Advanced bilateral L5 foraminal narrowing. IMPRESSION: CT ABDOMEN AND PELVIS IMPRESSION 1. Postoperative changes from recent right hemicolectomy with partial small bowel resection. Diffuse small bowel dilatation to the level of the surgical anastomosis favored to reflect postoperative  ileus, although an obstructive process would be difficult to exclude, and could be considered in the correct clinical setting. 2. Small moderate volume free fluid within the abdomen and pelvis with persistent small volume free intraperitoneal air. Findings are somewhat greater than expected for normal expected postoperative changes given the patient is postoperative day 10 from prior surgery. Findings raise the possibility for an enteric leak, although source is not definitely visualized on this exam. No loculated collections or intra-abdominal abscess identified. 3. Pulmonary nodules at the left lung base as above, slightly increased in size, suggesting mildly progressed disease. Scattered mediastinal and upper abdominal adenopathy stable to slightly increased in size as well. 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 4. Aortic atherosclerosis. CT LUMBAR SPINE IMPRESSION 1. No CT evidence for acute abnormality within the lumbar spine. 2. 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 3. 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 4. No evidence for osseous metastatic disease. Results were discussed by telephone at the time of interpretation on 03/27/2019 at 11:57 pm with Dr. Lennice Sites. Electronically Signed   By: Jeannine Boga M.D.   On: 03/27/2019 23:59   Ct L-spine No Charge  Result Date: 03/27/2019 CLINICAL DATA:  Initial evaluation for acute abdominal and back pain. EXAM: CT ABDOMEN AND PELVIS WITHOUT CONTRAST TECHNIQUE: Multidetector CT imaging of the abdomen and pelvis was performed following the standard protocol without IV contrast. COMPARISON:  Comparison made with prior CT from 03/16/2019. FINDINGS: CT ABDOMEN AND PELVIS Lower chest: Small layering right pleural effusion with associated atelectasis. Probable postradiation changes partially  visualize within the right perihilar region, grossly similar. 2.8 cm pleural base nodule at the lingula is increased in size (series 6, image 21). Additional 15 mm left lower lobe nodule also increased in size (series 6, image 25). 12 mm node noted near the AE recess, similar. Right cardiophrenic implant measures up to 2.6 cm, perhaps slightly increased in size from previous. Trace pericardial effusion noted. Hepatobiliary: 2.3 cm hypodense lesion at the right hepatic dome noted, stable, concerning for metastatic disease. Additional 15 mm lesion slightly inferiorly within the right hepatic lobe also similar. Additional subcentimeter hypodensity within the right hepatic lobe too small the characterize and could reflect a small cyst versus metastatic implant, stable from previous. Gallbladder surgically absent. Mild scattered intrahepatic biliary dilatation similar to previous. Pancreas: Pancreas within normal limits. Spleen: Spleen within normal limits. Adrenals/Urinary Tract: Right adrenal gland is normal. 19 mm left adrenal nodule noted, stable. Kidneys equal in size with symmetric enhancement. No nephrolithiasis, hydronephrosis or focal enhancing renal mass. No hydroureter. Bladder within normal limits. Stomach/Bowel: Stomach largely decompressed without acute finding. Postoperative changes from interval right hemicolectomy with partial small bowel resection and ileocolonic anastomosis. Small bowel is diffusely dilated and fluid-filled to the level of the surgical anastomosis within the right lower quadrant, measuring up to 5 cm in maximal diameter. Few scattered internal air-fluid levels. Mild gaseous distension of the colon. Imaging findings favored to reflect postoperative ileus.  Few scattered colonic diverticula noted without evidence for acute diverticulitis. Small to moderate volume free fluid present within the abdomen and pelvis, measuring simple fluid density. Additionally, scattered small volume free air  seen within the abdomen, somewhat greater than expected for approximately 10 days post surgery. Findings raise the possibility for an ongoing leak, source not identified. No discrete loculated collections or intra-abdominal abscess. Vascular/Lymphatic: Moderate to advanced aorto bi-iliac atherosclerotic disease. Infrarenal aorta ectatic measuring up to 2.8 cm in transverse diameter. Mesenteric vessels patent proximally. Enlarged porta hepatis lymph nodes again seen, measuring up to 2.4 cm in maximal diameter, perhaps slightly increased in size from previous. No other new adenopathy. Reproductive: Prostate normal. Other: Focal skin breakdown noted along the midline incision. No loculated collections or other complication along the surgical incision itself. Mild diffuse anasarca noted. Musculoskeletal: No acute osseous abnormality. No discrete lytic or blastic osseous lesions. CT LUMBAR SPINE: Segmentation: Standard. Lowest well-formed disc labeled the L5-S1 level. Alignment: Straightening with slight reversal of the normal lumbar lordosis. Trace retrolisthesis of L3 on L4 and L4 on L5, likely chronic and facet mediated. No malalignment. Vertebrae: Vertebral body heights maintained without evidence for acute or chronic fracture. No discrete lytic or blastic osseous lesions. Visualized sacrum and pelvis intact. SI joints approximated and symmetric. Paraspinal and other soft tissues: Paraspinous soft tissues demonstrate no acute finding. Disc levels: T12-L1: Disc desiccation with mild diffuse disc bulge.  No stenosis. L1-2: Chronic intervertebral disc space narrowing with disc desiccation and diffuse disc bulge. Right far lateral endplate osteophytic spurring. No significant stenosis. L2-3: Diffuse disc bulge with intervertebral disc space narrowing. Prominent reactive endplate changes with endplate osteophytic spurring. Mild disc desiccation. Mild bilateral facet hypertrophy. Resultant mild spinal stenosis. Mild to  moderate right L2 foraminal narrowing. L3-4: Diffuse disc bulge with disc desiccation and intervertebral disc space narrowing. Reactive endplate changes. Mild facet hypertrophy. Resultant moderate spinal stenosis. Foramina remain patent. L4-5: Diffuse disc bulge with associated annular calcification. Moderate facet hypertrophy. Resultant severe spinal stenosis. Mild to moderate bilateral L4 foraminal narrowing. L5-S1: Chronic intervertebral disc space narrowing with diffuse disc bulge and disc desiccation. Reactive endplate changes with marginal endplate osteophytic spurring. No significant spinal stenosis. Advanced bilateral L5 foraminal narrowing. IMPRESSION: CT ABDOMEN AND PELVIS IMPRESSION 1. Postoperative changes from recent right hemicolectomy with partial small bowel resection. Diffuse small bowel dilatation to the level of the surgical anastomosis favored to reflect postoperative ileus, although an obstructive process would be difficult to exclude, and could be considered in the correct clinical setting. 2. Small moderate volume free fluid within the abdomen and pelvis with persistent small volume free intraperitoneal air. Findings are somewhat greater than expected for normal expected postoperative changes given the patient is postoperative day 10 from prior surgery. Findings raise the possibility for an enteric leak, although source is not definitely visualized on this exam. No loculated collections or intra-abdominal abscess identified. 3. Pulmonary nodules at the left lung base as above, slightly increased in size, suggesting mildly progressed disease. Scattered mediastinal and upper abdominal adenopathy stable to slightly increased in size as well. 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 4. Aortic atherosclerosis. CT LUMBAR SPINE IMPRESSION 1. No CT evidence for acute abnormality within the lumbar spine. 2. Moderate multilevel degenerative spondylolysis with  resultant moderate to severe spinal stenosis at L3-4 and L4-5. 3. 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 4. No evidence for osseous metastatic disease. Results were discussed by telephone at the time of interpretation  on 03/27/2019 at 11:57 pm with Dr. Lennice Sites. Electronically Signed   By: Jeannine Boga M.D.   On: 03/27/2019 23:59      Scheduled Meds:  acetaminophen  1,000 mg Oral Q8H   enoxaparin (LOVENOX) injection  40 mg Subcutaneous Q24H   feeding supplement  237 mL Oral BID BM   folic acid  1 mg Oral Daily   multivitamins with iron  1 tablet Oral Daily   potassium chloride  20 mEq Oral BID   psyllium  1 packet Oral BID   saccharomyces boulardii  250 mg Oral BID   tamsulosin  0.4 mg Oral Daily   Continuous Infusions:  iron dextran (INFED/DEXFERRUM) infusion     Followed by   iron dextran (INFED/DEXFERRUM) infusion     magnesium sulfate 1 - 4 g bolus IVPB       LOS: 0 days    Time spent: 35 minutes   Dessa Phi, DO [facility] [email or web address] 03/29/2019, 12:34 PM

## 2019-03-29 NOTE — Telephone Encounter (Signed)
colon resection > met melanoma. Please ask Julien Nordmann to see pt. Altamese Dilling notified and she will see pt.

## 2019-03-30 ENCOUNTER — Telehealth: Payer: Self-pay | Admitting: Internal Medicine

## 2019-03-30 DIAGNOSIS — Z923 Personal history of irradiation: Secondary | ICD-10-CM

## 2019-03-30 DIAGNOSIS — C499 Malignant neoplasm of connective and soft tissue, unspecified: Secondary | ICD-10-CM

## 2019-03-30 DIAGNOSIS — C7801 Secondary malignant neoplasm of right lung: Secondary | ICD-10-CM

## 2019-03-30 DIAGNOSIS — Z9221 Personal history of antineoplastic chemotherapy: Secondary | ICD-10-CM

## 2019-03-30 DIAGNOSIS — D509 Iron deficiency anemia, unspecified: Secondary | ICD-10-CM

## 2019-03-30 DIAGNOSIS — R000 Tachycardia, unspecified: Secondary | ICD-10-CM

## 2019-03-30 DIAGNOSIS — E876 Hypokalemia: Secondary | ICD-10-CM

## 2019-03-30 DIAGNOSIS — C781 Secondary malignant neoplasm of mediastinum: Secondary | ICD-10-CM

## 2019-03-30 DIAGNOSIS — K56609 Unspecified intestinal obstruction, unspecified as to partial versus complete obstruction: Secondary | ICD-10-CM

## 2019-03-30 DIAGNOSIS — Z9049 Acquired absence of other specified parts of digestive tract: Secondary | ICD-10-CM

## 2019-03-30 LAB — BASIC METABOLIC PANEL
Anion gap: 5 (ref 5–15)
BUN: 9 mg/dL (ref 6–20)
CO2: 25 mmol/L (ref 22–32)
Calcium: 7.5 mg/dL — ABNORMAL LOW (ref 8.9–10.3)
Chloride: 105 mmol/L (ref 98–111)
Creatinine, Ser: 0.39 mg/dL — ABNORMAL LOW (ref 0.61–1.24)
GFR calc Af Amer: >60 mL/min (ref >60)
GFR calc non Af Amer: >60 mL/min (ref >60)
Glucose, Bld: 88 mg/dL (ref 70–99)
Potassium: 4 mmol/L (ref 3.5–5.1)
Sodium: 135 mmol/L (ref 135–145)

## 2019-03-30 LAB — CBC
HCT: 30.9 % — ABNORMAL LOW (ref 39.0–52.0)
Hemoglobin: 8.7 g/dL — ABNORMAL LOW (ref 13.0–17.0)
MCH: 20.5 pg — ABNORMAL LOW (ref 26.0–34.0)
MCHC: 28.2 g/dL — ABNORMAL LOW (ref 30.0–36.0)
MCV: 72.9 fL — ABNORMAL LOW (ref 80.0–100.0)
Platelets: 326 10*3/uL (ref 150–400)
RBC: 4.24 MIL/uL (ref 4.22–5.81)
RDW: 26 % — ABNORMAL HIGH (ref 11.5–15.5)
WBC: 8.4 10*3/uL (ref 4.0–10.5)
nRBC: 0 % (ref 0.0–0.2)

## 2019-03-30 LAB — MAGNESIUM: Magnesium: 1.7 mg/dL (ref 1.7–2.4)

## 2019-03-30 MED ORDER — LIP MEDEX EX OINT
1.0000 "application " | TOPICAL_OINTMENT | Freq: Two times a day (BID) | CUTANEOUS | Status: DC
  Administered 2019-03-30 – 2019-03-31 (×3): 1 via TOPICAL
  Filled 2019-03-30: qty 7

## 2019-03-30 MED ORDER — TRAMADOL HCL 50 MG PO TABS
50.0000 mg | ORAL_TABLET | Freq: Four times a day (QID) | ORAL | Status: DC | PRN
  Administered 2019-03-31: 100 mg via ORAL
  Filled 2019-03-30 (×2): qty 2

## 2019-03-30 MED ORDER — ALUM & MAG HYDROXIDE-SIMETH 200-200-20 MG/5ML PO SUSP: 30.0000 mL | Freq: Four times a day (QID) | ORAL | Status: DC | PRN

## 2019-03-30 MED ORDER — MAGNESIUM SULFATE 4 GM/100ML IV SOLN: 4.0000 g | Freq: Once | INTRAVENOUS | Status: DC

## 2019-03-30 MED ORDER — GABAPENTIN 300 MG PO CAPS: 300.0000 mg | ORAL_CAPSULE | Freq: Three times a day (TID) | ORAL | Status: DC | PRN

## 2019-03-30 MED ORDER — CYANOCOBALAMIN 1000 MCG/ML IJ SOLN
1000.0000 ug | Freq: Every day | INTRAMUSCULAR | Status: DC
  Administered 2019-03-30 – 2019-03-31 (×2): 1000 ug via SUBCUTANEOUS
  Filled 2019-03-30 (×2): qty 1

## 2019-03-30 MED ORDER — GUAIFENESIN-DM 100-10 MG/5ML PO SYRP: 10.0000 mL | ORAL_SOLUTION | ORAL | Status: DC | PRN

## 2019-03-30 MED ORDER — MAGIC MOUTHWASH
15.0000 mL | Freq: Four times a day (QID) | ORAL | Status: DC | PRN
  Filled 2019-03-30: qty 15

## 2019-03-30 MED ORDER — SODIUM CHLORIDE 0.9 % IV BOLUS
500.0000 mL | Freq: Once | INTRAVENOUS | Status: AC
  Administered 2019-03-30: 500 mL via INTRAVENOUS

## 2019-03-30 MED ORDER — PROCHLORPERAZINE EDISYLATE 10 MG/2ML IJ SOLN: 5.0000 mg | INTRAMUSCULAR | Status: DC | PRN

## 2019-03-30 MED ORDER — METHOCARBAMOL 500 MG PO TABS: 1000.0000 mg | ORAL_TABLET | Freq: Four times a day (QID) | ORAL | Status: DC | PRN

## 2019-03-30 MED ORDER — HYDROCORTISONE 1 % EX CREA: 1.0000 "application " | TOPICAL_CREAM | Freq: Three times a day (TID) | CUTANEOUS | Status: DC | PRN

## 2019-03-30 MED ORDER — PHENOL 1.4 % MT LIQD: 1.0000 | OROMUCOSAL | Status: DC | PRN

## 2019-03-30 MED ORDER — METHOCARBAMOL 1000 MG/10ML IJ SOLN
1000.0000 mg | Freq: Four times a day (QID) | INTRAVENOUS | Status: DC | PRN
  Filled 2019-03-30: qty 10

## 2019-03-30 MED ORDER — HYDRALAZINE HCL 20 MG/ML IJ SOLN: 5.0000 mg | INTRAMUSCULAR | Status: DC | PRN

## 2019-03-30 MED ORDER — METOPROLOL TARTRATE 12.5 MG HALF TABLET: 12.5000 mg | ORAL_TABLET | Freq: Two times a day (BID) | ORAL | Status: DC | PRN

## 2019-03-30 MED ORDER — MENTHOL 3 MG MT LOZG: 1.0000 | LOZENGE | OROMUCOSAL | Status: DC | PRN

## 2019-03-30 MED ORDER — BISMUTH SUBSALICYLATE 262 MG/15ML PO SUSP: 30.0000 mL | Freq: Three times a day (TID) | ORAL | Status: DC | PRN

## 2019-03-30 MED ORDER — HYDROCORTISONE (PERIANAL) 2.5 % EX CREA: 1.0000 "application " | TOPICAL_CREAM | Freq: Four times a day (QID) | CUTANEOUS | Status: DC | PRN

## 2019-03-30 MED ORDER — MAGNESIUM SULFATE 2 GM/50ML IV SOLN
2.0000 g | Freq: Once | INTRAVENOUS | Status: AC
  Administered 2019-03-30: 2 g via INTRAVENOUS
  Filled 2019-03-30: qty 50

## 2019-03-30 MED ORDER — PSYLLIUM 95 % PO PACK
1.0000 | PACK | Freq: Every day | ORAL | Status: DC
  Administered 2019-03-31: 1 via ORAL
  Filled 2019-03-30: qty 1

## 2019-03-30 NOTE — Progress Notes (Signed)
PROGRESS NOTE/Consult    BARTOLO MONTANYE  CVE:[id number] DOB: 22-May-1956 DOA: 03/27/2019 PCP: [name], MD   Brief Narrative:  Latrelle Bazar Dunnis a 63 y.o.malewith history of malignant melanoma, chronic back pain, hypertension,hyperlipidemia, stroke, remote history of opiate addiction. On 4/15, patient was admitted for intussusception of bowel secondary to metastatic melanoma. On 4/16, he underwent right hemicolectomy and small bowel resection. Surgical pathology confirmed metastatic melanoma with 1/21 lymph nodes positive. He had significant postoperative ileus.But bowel function eventually returned, his diet was slowly advanced and he was discharged home 3 days ago on 4/24. Apparently, he tripped over rug at home leading to fall around 5:30 PM yesterday 4/27 leading to severe abdominal pain and hence return to the ED.   He was admitted by general surgery team and TRH consulted to manage medical conditions.     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  Patient passing flatus.  Patient states had bowel movement.  Patient's diet has been advanced.  Per primary team.  2.  Sinus tachycardia Patient noted to be consistently sinus tachycardic between 100-120.  Patient not septic.  Patient with borderline blood pressure.  Concerned that he may be related to degree of anemia.  We will give a bolus of normal saline 500 cc  x 1.  Patient currently asymptomatic.  Follow.  3.  Iron deficiency anemia/acute on chronic anemia/folate deficiency/low B12 levels. Patient noted to have an iron deficiency anemia with a hemoglobin baseline of 8-9 in 2019.  Patient's hemoglobin has been running between 7-8 since last admission on March 16, 2019 required multiple transfusions.  Patient received packed red blood cells on 03/28/2019.  Patient status post IV INFeD.  Hemoglobin currently at 8.7 today.  Continue folic acid.  Continue multivitamin.  Will place on vitamin B12 1000 MCG's subcutaneously daily while inpatient and will likely need to be on oral supplementation in the outpatient setting post discharge.  Continue oral iron supplementation.  Outpatient follow-up with PCP.  4.  Hypokalemia/hypomagnesemia Potassium repleted and currently at 4.  Magnesium was 1.3 and at 1.7 today.  We will give magnesium sulfate IV 4 g x 1.  Follow.  5.  Malignant melanoma Outpatient follow-up with oncology, Dr. Lorna Few.    DVT prophylaxis: Lovenox Code Status: Full Family Communication: Updated patient.  No family at bedside. Disposition Plan: Per primary team.   Consultants:   [facility]: Dr.Dahal  Procedures:   CT abdomen and pelvis 03/27/2019  Chest x-ray 03/28/2019  Antimicrobials:   None   Subjective: Patient states feeling better.  Denies any chest pain or shortness of breath.  States passing flatus.  Patient states had bowel movement this morning.  Tolerating current diet.  Objective: Vitals:   03/29/19 2202 03/30/19 0547 03/30/19 1216 03/30/19 1428  BP: 110/75 94/73  98/75  Pulse: (!) 111 (!) 108  (!) 115  Resp: 20 20  16   Temp: 98.1 F (36.7  C) 98.3 F (36.8 C)  98.4 F (36.9 C)  TempSrc: Oral Oral  Oral  SpO2: 92% 92% 93% 94%  Weight:      Height:        Intake/Output Summary (Last 24 hours) at 03/30/2019 1914 Last data filed at 03/30/2019 1800 Gross per 24 hour  Intake 1546.88 ml   Output 2775 ml  Net -1228.12 ml   Filed Weights   03/27/19 1838  Weight: 77.1 kg    Examination:  General exam: Appears calm and comfortable  Respiratory system: Clear to auscultation. Respiratory effort normal. Cardiovascular system: S1 & S2 heard, RRR. No JVD, murmurs, rubs, gallops or clicks. No pedal edema. Gastrointestinal system: Abdomen is nondistended, soft and nontender.  Bandage noted on abdomen on surgical/wound site.  No organomegaly or masses felt. Normal bowel sounds heard. Central nervous system: Alert and oriented. No focal neurological deficits. Extremities: Symmetric 5 x 5 power. Skin: No rashes, lesions or ulcers Psychiatry: Judgement and insight appear normal. Mood & affect appropriate.     Data Reviewed: I have personally reviewed following labs and imaging studies  CBC: Recent Labs  Lab 03/27/19 2011 03/28/19 0756 03/28/19 1034 03/29/19 0436 03/30/19 0347  WBC 10.0 5.6 6.0 6.4 8.4  HGB 8.2* 6.7* 7.0* 7.7* 8.7*  HCT 29.5* 24.2* 25.2* 27.5* 30.9*  MCV 70.9* 71.6* 71.4* 73.5* 72.9*  PLT 387 326 336 332 983   Basic Metabolic Panel: Recent Labs  Lab 03/26/19 0340 03/27/19 2011 03/28/19 0756 03/29/19 0436 03/30/19 0347  NA 133* 135 135 136 135  K 4.2 3.0* 3.4* 3.5 4.0  CL 100 95* 104 103 105  CO2 27 28 24 25 25   GLUCOSE 121* 116* 82 98 88  BUN 6 9 8  5* 9  CREATININE 0.53* 0.62 0.42* 0.48* 0.39*  CALCIUM 7.7* 8.6* 7.2* 7.4* 7.5*  MG  --   --   --  1.3* 1.7  PHOS  --   --   --  3.7  --    GFR: Estimated Creatinine Clearance: 107.1 mL/min (A) (by C-G formula based on SCr of 0.39 mg/dL (L)). Liver Function Tests: Recent Labs  Lab 03/27/19 2011  AST 15  ALT 12  ALKPHOS 95  BILITOT 0.7  PROT 7.1  ALBUMIN 3.0*   Recent Labs  Lab 03/27/19 2011  LIPASE 43   No results for input(s): AMMONIA in the last 168 hours. Coagulation Profile: No results for input(s): INR, PROTIME in the last 168 hours. Cardiac Enzymes: Recent Labs  Lab  03/28/19 1323  TROPONINI <0.03   BNP (last 3 results) No results for input(s): PROBNP in the last 8760 hours. HbA1C: No results for input(s): HGBA1C in the last 72 hours. CBG: No results for input(s): GLUCAP in the last 168 hours. Lipid Profile: No results for input(s): CHOL, HDL, LDLCALC, TRIG, CHOLHDL, LDLDIRECT in the last 72 hours. Thyroid Function Tests: No results for input(s): TSH, T4TOTAL, FREET4, T3FREE, THYROIDAB in the last 72 hours. Anemia Panel: Recent Labs    03/28/19 1135 03/28/19 1323  VITAMINB12 278  --   FOLATE 5.4*  --   FERRITIN 16*  --   TIBC 203*  --   IRON 6*  --   RETICCTPCT  --  0.9   Sepsis Labs: No results for input(s): PROCALCITON, LATICACIDVEN in the last 168 hours.  No results found for this or any previous visit (from the past 240 hour(s)).       Radiology Studies: No results found.  Scheduled Meds: . acetaminophen  1,000 mg Oral Q8H  . cyanocobalamin  1,000 mcg Subcutaneous Daily  . enoxaparin (LOVENOX) injection  40 mg Subcutaneous Q24H  . feeding supplement  237 mL Oral BID BM  . folic acid  1 mg Oral Daily  . lip balm  1 application Topical BID  . multivitamins with iron  1 tablet Oral Daily  . potassium chloride  20 mEq Oral BID  . [START ON 03/31/2019] psyllium  1 packet Oral Daily  . tamsulosin  0.4 mg Oral Daily   Continuous Infusions: . methocarbamol (ROBAXIN) IV       LOS: 1 day    Time spent: 35 minutes    Irine Seal, MD [facility]  If 7PM-7AM, please contact night-coverage [email or web address] 03/30/2019, 7:14 PM

## 2019-03-30 NOTE — Evaluation (Signed)
Physical Therapy One Time Evaluation Patient Details Name: Gerald Adkins MRN: [id number] DOB: 1956-01-01 Today's Date: 03/30/2019   History of Present Illness  63 y.o. male with history of malignant melanoma, chronic back pain, hypertension, hyperlipidemia, stroke, remote history of opiate addiction. On 4/15, patient was admitted for intussusception of bowel secondary to metastatic melanoma.  On 4/16, he underwent right hemicolectomy and small bowel resection.  Pt had a fall (tripped over rug edge) leading to severe abdominal pain and returned to ED, readmitted for postop ileus.  Clinical Impression  Patient evaluated by Physical Therapy with no further acute PT needs identified. All education has been completed and the patient has no further questions.  Pt familiar from previous admission. Pt reports he tripped on rug edge at home however spouse has removed rug.  Pt unable to recall if RW available at home.  Recommend RW for d/c otherwise, no further PT needs identified at this time.  Pt encouraged to ambulate with staff during acute stay.  PT is signing off. Thank you for this referral.     Follow Up Recommendations No PT follow up    Equipment Recommendations  Rolling walker with 5" wheels    Recommendations for Other Services       Precautions / Restrictions Precautions Precautions: None      Mobility  Bed Mobility Overal bed mobility: Modified Independent                Transfers Overall transfer level: Needs assistance Equipment used: Rolling walker (2 wheeled) Transfers: Sit to/from Stand Sit to Stand: Supervision         General transfer comment: supervision for safety however pt performs without assist  Ambulation/Gait Ambulation/Gait assistance: Supervision Gait Distance (Feet): 350 Feet Assistive device: Rolling walker (2 wheeled) Gait Pattern/deviations: Step-through pattern;Trunk flexed;Decreased stride length     General Gait Details: encouraged  upright posture (abdominal incision - tends to prefer flexed trunk), tolerated distance well  Stairs            Wheelchair Mobility    Modified Rankin (Stroke Patients Only)       Balance Overall balance assessment: History of Falls                                           Pertinent Vitals/Pain Pain Assessment: No/denies pain Pain Intervention(s): Monitored during session;Repositioned    Home Living Family/patient expects to be discharged to:: Private residence Living Arrangements: Spouse/significant other Available Help at Discharge: Family Type of Home: House       Home Layout: One level Home Equipment: Cane - single point      Prior Function Level of Independence: Independent         Comments: reports walking one mile every day prior to admission     Hand Dominance        Extremity/Trunk Assessment        Lower Extremity Assessment Lower Extremity Assessment: Generalized weakness;Overall WFL for tasks assessed       Communication   Communication: No difficulties  Cognition Arousal/Alertness: Awake/alert Behavior During Therapy: WFL for tasks assessed/performed Overall Cognitive Status: Within Functional Limits for tasks assessed  General Comments      Exercises     Assessment/Plan    PT Assessment Patent does not need any further PT services  PT Problem List         PT Treatment Interventions      PT Goals (Current goals can be found in the Care Plan section)  Acute Rehab PT Goals PT Goal Formulation: All assessment and education complete, DC therapy    Frequency     Barriers to discharge        Co-evaluation               AM-PAC PT "6 Clicks" Mobility  Outcome Measure Help needed turning from your back to your side while in a flat bed without using bedrails?: None Help needed moving from lying on your back to sitting on the side of a flat  bed without using bedrails?: None Help needed moving to and from a bed to a chair (including a wheelchair)?: A Little Help needed standing up from a chair using your arms (e.g., wheelchair or bedside chair)?: A Little Help needed to walk in hospital room?: A Little Help needed climbing 3-5 steps with a railing? : A Little 6 Click Score: 20    End of Session   Activity Tolerance: Patient tolerated treatment well Patient left: in bed;with call bell/phone within reach;with nursing/sitter in room   PT Visit Diagnosis: Difficulty in walking, not elsewhere classified (R26.2)    Time: 9702-6378 PT Time Calculation (min) (ACUTE ONLY): 11 min   Charges:   PT Evaluation $PT Eval Low Complexity: [place], PT, DPT Acute Rehabilitation Services Office: [phone] Pager: [phone]  Trena Platt 03/30/2019, 12:52 PM

## 2019-03-30 NOTE — Progress Notes (Signed)
Regarding MEWS score of three, Blood pressure reading and heart rate have been a trend for patient. Asymptomatic. Donne Hazel, RN

## 2019-03-30 NOTE — Progress Notes (Signed)
HEMATOLOGY-ONCOLOGY PROGRESS NOTE  SUBJECTIVE: Gerald Adkins is a 63 year old male with metastatic malignant melanoma.  He has received palliative radiotherapy to his large right upper lobe lung mass and mediastinum and then was started on treatment with immunotherapy consisting of Keytruda 200 mg IV every 3 weeks.  He received 1 dose on 09/29/2018.  He had side effects related to his treatment and canceled all of his remaining appointments.  He has not been seen at the cancer center since October 2019.  Additionally, he was most to have an MRI of the brain and a PET scan performed which was never done.  It looks like the patient canceled these.  Chart has been reviewed.  The patient developed a small bowel obstruction and underwent exploratory laparotomy with small bowel and right colon resection in April 2020.  Pathology was consistent with his known metastatic malignant melanoma.  Oncology was asked to see the patient to discuss further plans for treatment with him.  When seen today, the patient reports that he is feeling better.  Denies abdominal pain, nausea, vomiting.  Bowels are moving well.  Denies chest discomfort but reports intermittent shortness of breath.  Denies hemoptysis.  He has no other complaints today.  When asked about his prior treatment with immunotherapy, patient reports that he had severe vomiting to the point that he could not breathe because he was vomiting so much.  He states that he does not want any additional "chemotherapy" for his melanoma but is open to discussing any other treatment options.    Metastatic melanoma ([place])   09/01/2018 Initial Diagnosis    Melanoma ([place])    09/29/2018 -  Chemotherapy    The patient had pembrolizumab (KEYTRUDA) 200 mg in sodium chloride 0.9 % 50 mL chemo infusion, 200 mg, Intravenous, Once, 1 of 6 cycles Administration: 200 mg (09/29/2018)  for chemotherapy treatment.      REVIEW OF SYSTEMS:   Constitutional: Denies fevers, chills or  abnormal weight loss Eyes: Denies blurriness of vision Ears, nose, mouth, throat, and face: Denies mucositis or sore throat Respiratory: Denies cough.  Has intermittent shortness of breath. Cardiovascular: Denies palpitation, chest discomfort Gastrointestinal:  Denies nausea, heartburn or change in bowel habits Skin: Denies abnormal skin rashes Lymphatics: Denies new lymphadenopathy or easy bruising Neurological:Denies numbness, tingling or new weaknesses Behavioral/Psych: Mood is stable, no new changes  Extremities: No lower extremity edema All other systems were reviewed with the patient and are negative.  I have reviewed the past medical history, past surgical history, social history and family history with the patient and they are unchanged from previous note.   PHYSICAL EXAMINATION:  Vitals:   03/29/19 2202 03/30/19 0547  BP: 110/75 94/73  Pulse: (!) 111 (!) 108  Resp: 20 20  Temp: 98.1 F (36.7 C) 98.3 F (36.8 C)  SpO2: 92% 92%   Filed Weights   03/27/19 1838  Weight: 170 lb (77.1 kg)    GENERAL:alert, no distress and comfortable SKIN: skin color, texture, turgor are normal, no rashes or significant lesions EYES: normal, Conjunctiva are pink and non-injected, sclera clear OROPHARYNX:no exudate, no erythema and lips, buccal mucosa, and tongue normal  NECK: supple, thyroid normal size, non-tender, without nodularity LYMPH:  no palpable lymphadenopathy in the cervical, axillary or inguinal LUNGS: clear to auscultation and percussion with normal breathing effort HEART: regular rate & rhythm and no murmurs and no lower extremity edema ABDOMEN:abdomen soft, non-tender and normal bowel sounds.  Dressing in place secondary to his abdominal incision.  Musculoskeletal:no cyanosis of digits and no clubbing  NEURO: alert & oriented x 3 with fluent speech, no focal motor/sensory deficits  LABORATORY DATA:  I have reviewed the data as listed CMP Latest Ref Rng & Units 03/30/2019  03/29/2019 03/28/2019  Glucose 70 - 99 mg/dL 88 98 82  BUN 6 - 20 mg/dL 9 5(L) 8  Creatinine 0.61 - 1.24 mg/dL 0.39(L) 0.48(L) 0.42(L)  Sodium 135 - 145 mmol/L 135 136 135  Potassium 3.5 - 5.1 mmol/L 4.0 3.5 3.4(L)  Chloride 98 - 111 mmol/L 105 103 104  CO2 22 - 32 mmol/L '25 25 24  '$ Calcium 8.9 - 10.3 mg/dL 7.5(L) 7.4(L) 7.2(L)  Total Protein 6.5 - 8.1 g/dL - - -  Total Bilirubin 0.3 - 1.2 mg/dL - - -  Alkaline Phos 38 - 126 U/L - - -  AST 15 - 41 U/L - - -  ALT 0 - 44 U/L - - -    Lab Results  Component Value Date   WBC 8.4 03/30/2019   HGB 8.7 (L) 03/30/2019   HCT 30.9 (L) 03/30/2019   MCV 72.9 (L) 03/30/2019   PLT 326 03/30/2019   NEUTROABS 11.6 (H) 03/16/2019    ASSESSMENT AND PLAN: This is a very pleasant 63 year old white male with metastatic malignant melanoma with negative BRAF mutation presented with large right upper lobe as well as 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 The patient underwent a palliative course of radiotherapy to the large right upper lobe and mediastinal mass under the care of Dr. Sondra Come with improvement of the SVC symptoms.  The patient started on treatment with Keytruda 200 mg IV every 3 weeks.  He completed 1 cycle of his treatment but canceled all further treatment secondary to side effects.  He has now developed a small bowel obstruction and has undergone surgery.  Pathology is consistent with his metastatic malignant melanoma.  I have discussed his diagnosis, prognosis, and treatment options with him.  He was again given the option of a referral to palliative care/hospice versus talking with Dr. Julien Nordmann further regarding additional treatment options.  The patient would like to speak with Dr. Julien Nordmann about his treatment options, but states that he does not want any additional "chemotherapy."  I have arranged for the patient to see Dr. Julien Nordmann next week.  Our transportation  coordinator has also arranged for transportation for the patient to and from the cancer center.  The patient was supposed to have an MRI of the brain and PET scan performed previously.  I will defer reordering these scans to Dr. Julien Nordmann after he speaks with the patient regarding his treatment options.  Mikey Bussing, DNP, AGPCNP-BC, AOCNP

## 2019-03-30 NOTE — Telephone Encounter (Signed)
Left voicemail for patient re his transportation that was set up for 5/7.

## 2019-03-30 NOTE — Progress Notes (Signed)
    CC:  Patient fell at home around 1730 hrs. on 03/27/2019 and return to the ED with severe abdominal pain.    Subjective: Doing well no issues, the abdominal pain got better after the foley was placed.  He is eating well, multiple BM's.  Abdominal wound looks fine.    Objective: Vital signs in last 24 hours: Temp:  [98.1 F (36.7 C)-98.6 F (37 C)] 98.3 F (36.8 C) (04/29 0547) Pulse Rate:  [108-127] 108 (04/29 0547) Resp:  [18-20] 20 (04/29 0547) BP: (94-110)/(73-77) 94/73 (04/29 0547) SpO2:  [92 %-94 %] 92 % (04/29 0547) Last BM Date: 03/29/19 1600 PO 260 IV Urine 4475  Stool x 7 Afebrile, VSS, Still mildly tachycardic Labs OK   H/H improving Intake/Output from previous day: 04/28 0701 - 04/29 0700 In: 1860 [P.O.:1600; IV Piggyback:260] Out: 7341 [Urine:4475] Intake/Output this shift: Total I/O In: 420 [P.O.:120; Other:300] Out: -   General appearance: alert, cooperative and no distress Resp: clear to auscultation bilaterally GI: soft, non-tender; bowel sounds normal; no masses,  no organomegaly and open wound is OK, healing nicely  Lab Results:  Recent Labs    03/29/19 0436 03/30/19 0347  WBC 6.4 8.4  HGB 7.7* 8.7*  HCT 27.5* 30.9*  PLT 332 326    BMET Recent Labs    03/29/19 0436 03/30/19 0347  NA 136 135  K 3.5 4.0  CL 103 105  CO2 25 25  GLUCOSE 98 88  BUN 5* 9  CREATININE 0.48* 0.39*  CALCIUM 7.4* 7.5*   PT/INR No results for input(s): LABPROT, INR in the last 72 hours.  Recent Labs  Lab 03/27/19 2011  AST 15  ALT 12  ALKPHOS 95  BILITOT 0.7  PROT 7.1  ALBUMIN 3.0*     Lipase     Component Value Date/Time   LIPASE 43 03/27/2019 2011     Medications: . acetaminophen  1,000 mg Oral Q8H  . cyanocobalamin  1,000 mcg Subcutaneous Daily  . enoxaparin (LOVENOX) injection  40 mg Subcutaneous Q24H  . feeding supplement  237 mL Oral BID BM  . folic acid  1 mg Oral Daily  . lip balm  1 application Topical BID  . multivitamins  with iron  1 tablet Oral Daily  . potassium chloride  20 mEq Oral BID  . [START ON 03/31/2019] psyllium  1 packet Oral Daily  . tamsulosin  0.4 mg Oral Daily   . methocarbamol (ROBAXIN) IV      Assessment/Plan Symptomatic anemia BPH/urinary retention - 800cc on bladder scan Ongoing marijuana use Fall with abdominal pain after marijuana use Possible fluid overload Hypokalemia - being replaced H/o CVA HTN HLD H/o metastatic melanoma - followed by Dr. Mckinley Jewel Hx chronic back pain Hx opiate addiction Hx tobacco use -none x 23 years Hx marijuana use Hx EtOH use  Metastatic malignant melanoma/multiple foci lymphovascular invasion was identified; metastatic melanoma in 1 of 21 lymph nodes Right hemicolectomy with small bowel resection 03/17/2019 Dr. Rolm Bookbinder  FEN: soft diet ID:  None DVT: Lovenox Son Jaimes - [phone]   Plan:  DC foley this PM, and if he can void home tomorrow.  Follow up with oncology and Dr. Donne Hazel.  Continue dressing changes at home.    LOS: 1 day    Lynnann Knudsen 03/30/2019 [phone]

## 2019-03-31 LAB — BASIC METABOLIC PANEL
Anion gap: 9 (ref 5–15)
BUN: 12 mg/dL (ref 6–20)
CO2: 21 mmol/L — ABNORMAL LOW (ref 22–32)
Calcium: 7.7 mg/dL — ABNORMAL LOW (ref 8.9–10.3)
Chloride: 102 mmol/L (ref 98–111)
Creatinine, Ser: 0.38 mg/dL — ABNORMAL LOW (ref 0.61–1.24)
GFR calc Af Amer: >60 mL/min (ref >60)
GFR calc non Af Amer: >60 mL/min (ref >60)
Glucose, Bld: 89 mg/dL (ref 70–99)
Potassium: 4 mmol/L (ref 3.5–5.1)
Sodium: 132 mmol/L — ABNORMAL LOW (ref 135–145)

## 2019-03-31 LAB — HEMOGLOBIN AND HEMATOCRIT, BLOOD
HCT: 31.7 % — ABNORMAL LOW (ref 39.0–52.0)
Hemoglobin: 8.7 g/dL — ABNORMAL LOW (ref 13.0–17.0)

## 2019-03-31 LAB — MAGNESIUM: Magnesium: 1.6 mg/dL — ABNORMAL LOW (ref 1.7–2.4)

## 2019-03-31 MED ORDER — FOLIC ACID 1 MG PO TABS: 1.0000 mg | ORAL_TABLET | Freq: Every day | ORAL | Status: AC

## 2019-03-31 MED ORDER — MAGNESIUM SULFATE 4 GM/100ML IV SOLN
4.0000 g | Freq: Once | INTRAVENOUS | Status: AC
  Administered 2019-03-31: 4 g via INTRAVENOUS
  Filled 2019-03-31: qty 100

## 2019-03-31 MED ORDER — MAGNESIUM OXIDE 400 (241.3 MG) MG PO TABS
400.0000 mg | ORAL_TABLET | Freq: Two times a day (BID) | ORAL | Status: DC
  Administered 2019-03-31: 400 mg via ORAL
  Filled 2019-03-31: qty 1

## 2019-03-31 MED ORDER — ACETAMINOPHEN 500 MG PO TABS: 1000.0000 mg | ORAL_TABLET | Freq: Three times a day (TID) | ORAL | 0 refills | Status: AC

## 2019-03-31 MED ORDER — TAB-A-VITE/IRON PO TABS: 1.0000 | ORAL_TABLET | Freq: Every day | ORAL | 0 refills | Status: AC

## 2019-03-31 MED ORDER — MAGNESIUM OXIDE 400 (241.3 MG) MG PO TABS: 400.0000 mg | ORAL_TABLET | Freq: Two times a day (BID) | ORAL | 0 refills | Status: AC

## 2019-03-31 MED ORDER — ALUM & MAG HYDROXIDE-SIMETH 200-200-20 MG/5ML PO SUSP: 30.0000 mL | Freq: Four times a day (QID) | ORAL | Status: DC | PRN

## 2019-03-31 MED ORDER — TAMSULOSIN HCL 0.4 MG PO CAPS: 0.4000 mg | ORAL_CAPSULE | Freq: Every day | ORAL | 1 refills | Status: AC

## 2019-03-31 MED ORDER — OXYCODONE HCL 5 MG PO TABS: 5.0000 mg | ORAL_TABLET | ORAL | 0 refills | Status: AC | PRN

## 2019-03-31 MED ORDER — VITAMIN B-12 1000 MCG PO TABS: 1000.0000 ug | ORAL_TABLET | Freq: Every day | ORAL | 0 refills | Status: AC

## 2019-03-31 MED ORDER — OXYCODONE HCL 5 MG PO TABS: 5.0000 mg | ORAL_TABLET | ORAL | Status: DC | PRN

## 2019-03-31 NOTE — Discharge Instructions (Signed)
Mechanical Wound Debridement  Continue to pack open portion of the wound twice a day as before.  Your can shower with the wound open to soap and water.    Mechanical wound debridement is a treatment to remove dead tissue from a wound. This helps the wound heal. The treatment involves cleaning the wound (irrigation) and using a pad or gauze (dressing) to remove dead tissue and debris from the wound. There are different types of mechanical wound debridement. Depending on the wound, you may need to repeat this procedure or change to another form of debridement as your wound starts to heal. Tell a health care provider about:  Any allergies you have.  All medicines you are taking, including vitamins, herbs, eye drops, creams, and over-the-counter medicines.  Any blood disorders you have.  Any medical conditions you have, including any conditions that: ? Cause a significant decrease in blood circulation to the part of the body where the wound is, such as peripheral vascular disease. ? Compromise your defense (immune) system or white blood count.  Any surgeries you have had.  Whether you are pregnant or may be pregnant. What are the risks? Generally, this is a safe procedure. However, problems may occur, including:  Infection.  Bleeding.  Damage to healthy tissue in and around your wound.  Soreness or pain.  Failure of the wound to heal.  Scarring. What happens before the procedure? You may be given antibiotic medicine to help prevent infection. What happens during the procedure?  Your health care provider may apply a numbing medicine (topical anesthetic) to the wound.  Your health care provider will irrigate your wound with a germ-free (sterile), salt-water (saline) solution. This removes debris, bacteria, and dead tissue.  Depending on what type of mechanical wound debridement you are having, your health care provider may do one of the following: ? Put a dressing on your wound.  You may have dry gauze pad placed into the wound. Your health care provider will remove the gauze after the wound is dry. Any dead tissue and debris that has dried into the gauze will be lifted out of the wound (wet-to-dry debridement). ? Use a type of pad (monofilament fiber debridement pad). This pad has a fluffy surface on one side that picks up dead tissue and debris from your wound. Your health care provider wets the pad and wipes it over your wound for several minutes. ? Irrigate your wound with a pressurized stream of solution such as saline or water.  Once your health care provider is finished, he or she may apply a light dressing to your wound. The procedure may vary among health care providers and hospitals. What happens after the procedure?  You may receive medicine for pain.  You will continue to receive antibiotic medicine if it was started before your procedure. This information is not intended to replace advice given to you by your health care provider. Make sure you discuss any questions you have with your health care provider. Document Released: 08/08/2015 Document Revised: 04/24/2016 Document Reviewed: 03/28/2015 Elsevier Interactive Patient Education  2019 [facility], [place] [phone]  OPEN ABDOMINAL SURGERY: POST OP INSTRUCTIONS  Always review your discharge instruction sheet given to you by the facility where your surgery was performed.  IF YOU HAVE DISABILITY OR FAMILY LEAVE FORMS, YOU MUST BRING THEM TO THE OFFICE FOR PROCESSING.  PLEASE DO NOT GIVE THEM TO YOUR DOCTOR.  1. A prescription for pain medication  may be given to you upon discharge.  Take your pain medication as prescribed, if needed.  If narcotic pain medicine is not needed, then you may take acetaminophen (Tylenol) or ibuprofen (Advil) as needed. 2. Take your usually prescribed medications unless otherwise directed. 3. If you need a refill on your pain medication,  please contact your pharmacy. They will contact our office to request authorization.  Prescriptions will not be filled after 5pm or on week-ends. 4. You should follow a light diet the first few days after arrival home, such as soup and crackers, pudding, etc.unless your doctor has advised otherwise. A high-fiber, low fat diet can be resumed as tolerated.   Be sure to include lots of fluids daily. Most patients will experience some swelling and bruising on the chest and neck area.  Ice packs will help.  Swelling and bruising can take several days to resolve 5. Most patients will experience some swelling and bruising in the area of the incision. Ice pack will help. Swelling and bruising can take several days to resolve..  6. It is common to experience some constipation if taking pain medication after surgery.  Increasing fluid intake and taking a stool softener will usually help or prevent this problem from occurring.  A mild laxative (Milk of Magnesia or Miralax) should be taken according to package directions if there are no bowel movements after 48 hours. 7.  You may have steri-strips (small skin tapes) in place directly over the incision.  These strips should be left on the skin for 7-10 days.  If your surgeon used skin glue on the incision, you may shower in 24 hours.  The glue will flake off over the next 2-3 weeks.  Any sutures or staples will be removed at the office during your follow-up visit. You may find that a light gauze bandage over your incision may keep your staples from being rubbed or pulled. You may shower and replace the bandage daily. 8. ACTIVITIES:  You may resume regular (light) daily activities beginning the next day--such as daily self-care, walking, climbing stairs--gradually increasing activities as tolerated.  You may have sexual intercourse when it is comfortable.  Refrain from any heavy lifting or straining until approved by your doctor. a. You may drive when you no longer are  taking prescription pain medication, you can comfortably wear a seatbelt, and you can safely maneuver your car and apply brakes b. Return to Work: ___________________________________ 71. You should see your doctor in the office for a follow-up appointment approximately two weeks after your surgery.  Make sure that you call for this appointment within a day or two after you arrive home to insure a convenient appointment time. OTHER INSTRUCTIONS:  _____________________________________________________________ _____________________________________________________________  WHEN TO CALL YOUR DOCTOR: 1. Fever over 101.0 2. Inability to urinate 3. Nausea and/or vomiting 4. Extreme swelling or bruising 5. Continued bleeding from incision. 6. Increased pain, redness, or drainage from the incision. 7. Difficulty swallowing or breathing 8. Muscle cramping or spasms. 9. Numbness or tingling in hands or feet or around lips.  The clinic staff is available to answer your questions during regular business hours.  Please dont hesitate to call and ask to speak to one of the nurses if you have concerns.  For further questions, please visit [email or web address]  Tamsulosin capsules   you need to see your primary care doctor to renew this it is for your urinary retention(trouble voiding, because of your enlarged bladder  What is this medicine? TAMSULOSIN (tam SOO loe sin) is an alpha blocker. It is used to treat the signs and symptoms of an enlarged prostate in men. This condition is also called benign prostatic hyperplasia (BPH). This medicine may be used for other purposes; ask your health care provider or pharmacist if you have questions. COMMON BRAND NAME(S): Flomax What should I tell my health care provider before I take this medicine? They need to know if you have any of the following conditions: -advanced  kidney disease -advanced liver disease -low blood pressure -prostate cancer -an unusual or allergic reaction to tamsulosin, sulfa drugs, other medicines, foods, dyes, or preservatives -pregnant or trying to get pregnant -breast-feeding How should I use this medicine? Take this medicine by mouth about 30 minutes after the same meal every day. Follow the directions on the prescription label. Swallow the capsules whole with a glass of water. Do not crush, chew, or open capsules. Do not take your medicine more often than directed. Do not stop taking your medicine unless your doctor tells you to. Talk to your pediatrician regarding the use of this medicine in children. Special care may be needed. Overdosage: If you think you have taken too much of this medicine contact a poison control center or emergency room at once. NOTE: This medicine is only for you. Do not share this medicine with others. What if I miss a dose? If you miss a dose, take it as soon as you can. If it is almost time for your next dose, take only that dose. Do not take double or extra doses. If you stop taking your medicine for several days or more, ask your doctor or health care professional what dose you should start back on. What may interact with this medicine? -cimetidine -fluoxetine -ketoconazole -medicines for erectile disfunction like sildenafil, tadalafil, vardenafil -medicines for high blood pressure -other alpha-blockers like alfuzosin, doxazosin, phentolamine, phenoxybenzamine, prazosin, terazosin -warfarin This list may not describe all possible interactions. Give your health care provider a list of all the medicines, herbs, non-prescription drugs, or dietary supplements you use. Also tell them if you smoke, drink alcohol, or use illegal drugs. Some items may interact with your medicine. What should I watch for while using this medicine? Visit your doctor or health care professional for regular check ups. You will  need lab work done before you start this medicine and regularly while you are taking it. Check your blood pressure as directed. Ask your health care professional what your blood pressure should be, and when you should contact him or her. This medicine may make you feel dizzy or lightheaded. This is more likely to happen after the first dose, after an increase in dose, or during hot weather or exercise. Drinking alcohol and taking some medicines can make this worse. Do not drive, use machinery, or do anything that needs mental alertness until you know how this medicine affects you. Do not sit or stand up quickly. If you begin to feel dizzy, sit down until you feel better. These effects can decrease once your body adjusts to the medicine. Contact your doctor or health care professional right away if you have an erection that lasts longer than 4 hours or if it becomes painful. This may be a sign of a serious problem and must be treated right away to  prevent permanent damage. If you are thinking of having cataract surgery, tell your eye surgeon that you have taken this medicine. What side effects may I notice from receiving this medicine? Side effects that you should report to your doctor or health care professional as soon as possible: -allergic reactions like skin rash or itching, hives, swelling of the lips, mouth, tongue, or throat -breathing problems -change in vision -feeling faint or lightheaded -irregular heartbeat -prolonged or painful erection -weakness Side effects that usually do not require medical attention (report to your doctor or health care professional if they continue or are bothersome): -back pain -change in sex drive or performance -constipation, nausea or vomiting -cough -drowsy -runny or stuffy nose -trouble sleeping This list may not describe all possible side effects. Call your doctor for medical advice about side effects. You may report side effects to FDA at  1-800-FDA-1088. Where should I keep my medicine? Keep out of the reach of children. Store at room temperature between 15 and 30 degrees C (59 and 86 degrees F). Throw away any unused medicine after the expiration date. NOTE: This sheet is a summary. It may not cover all possible information. If you have questions about this medicine, talk to your doctor, pharmacist, or health care provider.  2019 Elsevier/Gold Standard (2018-04-22 12:54:06)

## 2019-03-31 NOTE — Progress Notes (Signed)
PROGRESS NOTE/Consult    Gerald Adkins  XBJ:[id number] DOB: Aug 11, 1956 DOA: 03/27/2019 PCP: [name], MD   Brief Narrative:  Gerald Adkins a 63 y.o.malewith history of malignant melanoma, chronic back pain, hypertension,hyperlipidemia, stroke, remote history of opiate addiction. On 4/15, patient was admitted for intussusception of bowel secondary to metastatic melanoma. On 4/16, he underwent right hemicolectomy and small bowel resection. Surgical pathology confirmed metastatic melanoma with 1/21 lymph nodes positive. He had significant postoperative ileus.But bowel function eventually returned, his diet was slowly advanced and he was discharged home 3 days ago on 4/24. Apparently, he tripped over rug at home leading to fall around 5:30 PM yesterday 4/27 leading to severe abdominal pain and hence return to the ED.   He was admitted by general surgery team and [facility] consulted to manage medical conditions.     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  Patient passing flatus.  Patient states had bowel movement.  Patient's diet has been advanced which he is tolerating.  Per primary team.  2.  Sinus tachycardia Patient noted to be consistently sinus tachycardic between 100-120.  Patient not septic.  Patient with borderline blood pressure.  Concerned that he may be related to degree of anemia.  Patient asymptomatic.   Outpatient follow-up.  3.  Iron deficiency anemia/acute on chronic anemia/folate deficiency/low B12 levels. Patient noted to have an iron deficiency anemia with a hemoglobin baseline of 8-9 in 2019.  Patient's hemoglobin has been running between 7-8 since last admission on March 16, 2019 required multiple transfusions.  Patient received packed red blood cells on 03/28/2019.  Patient status post IV INFeD.  Hemoglobin currently at 8.7 today.  Continue folic acid.  Continue multivitamin.  Continue vitamin B12 1000 MCG's subcutaneously daily while inpatient and discharged on oral vitamin B12 supplementation with outpatient follow-up with PCP. Continue oral iron supplementation.  Outpatient follow-up with PCP.  4.  Hypokalemia/hypomagnesemia Potassium repleted and currently at 4.  Magnesium at 1.6 from 1.7 from 1.3.  We will give magnesium sulfate 4 g IV x1.  Magnesium oxide 400 mg twice daily and recommend discharging on oral magnesium x7 days with outpatient follow-up.   5.  Malignant melanoma Outpatient follow-up with oncology, Dr. Lorna Few.    DVT prophylaxis: Lovenox Code Status: Full Family Communication: Updated patient.  No family at bedside. Disposition Plan: Likely home today.  Okay with medicine for discharge.  Per primary team.   Consultants:   [facility]: Dr.Dahal  Procedures:   CT abdomen and pelvis 03/27/2019  Chest x-ray 03/28/2019  Antimicrobials:   None   Subjective: Patient sitting in bed.  Denies any chest pain or shortness of breath.  Passing flatus.  States he feels much better.  Tolerating diet.  Stated ambulating hallway.  Patient upset that he may not be discharged home today.   Objective: Vitals:   03/30/19 1216 03/30/19 1428 03/30/19 2101 03/31/19 0459  BP:  98/75 95/67 121/82  Pulse:  (!) 115 (!) 117 (!) 116  Resp:  16 16 18   Temp:  98.4 F (36.9 C) 98.4 F (36.9 C) 97.6 F (36.4 C)  TempSrc:  Oral Oral Oral  SpO2: 93% 94% 91% 95%    Weight:      Height:        Intake/Output Summary (Last 24 hours) at 03/31/2019 1337 Last data filed at 03/31/2019 1043 Gross per 24 hour  Intake 580 ml  Output 1375 ml  Net -795 ml   Filed Weights   03/27/19 1838  Weight: 77.1 kg    Examination:  General exam: NAD Respiratory system: Lungs clear to quotation bilaterally.  No wheezes, no crackles, no rhonchi.  Respiratory effort normal. Cardiovascular system: Regular rate rhythm no murmurs rubs or gallops.  No JVD.  No lower extremity edema. Gastrointestinal system: Abdomen is nondistended, soft and nontender.  Bandage noted on abdomen on surgical/wound site.  No organomegaly or masses felt. Normal bowel sounds heard. Central nervous system: Alert and oriented. No focal neurological deficits. Extremities: Symmetric 5 x 5 power. Skin: No rashes, lesions or ulcers Psychiatry: Judgement and insight appear normal. Mood & affect appropriate.     Data Reviewed: I have personally reviewed following labs and imaging studies  CBC: Recent Labs  Lab 03/27/19 2011 03/28/19 0756 03/28/19 1034 03/29/19 0436 03/30/19 0347 03/31/19 0427  WBC 10.0 5.6 6.0 6.4 8.4  --   HGB 8.2* 6.7* 7.0* 7.7* 8.7* 8.7*  HCT 29.5* 24.2* 25.2* 27.5* 30.9* 31.7*  MCV 70.9* 71.6* 71.4* 73.5* 72.9*  --   PLT 387 326 336 332 326  --    Basic Metabolic Panel: Recent Labs  Lab 03/27/19 2011 03/28/19 0756 03/29/19 0436 03/30/19 0347 03/31/19 0427  NA 135 135 136 135 132*  K 3.0* 3.4* 3.5 4.0 4.0  CL 95* 104 103 105 102  CO2 28 24 25 25  21*  GLUCOSE 116* 82 98 88 89  BUN 9 8 5* 9 12  CREATININE 0.62 0.42* 0.48* 0.39* 0.38*  CALCIUM 8.6* 7.2* 7.4* 7.5* 7.7*  MG  --   --  1.3* 1.7 1.6*  PHOS  --   --  3.7  --   --    GFR: Estimated Creatinine Clearance: 107.1 mL/min (A) (by C-G formula based on SCr of 0.38 mg/dL (L)). Liver Function Tests: Recent Labs  Lab 03/27/19 2011  AST 15  ALT 12  ALKPHOS 95  BILITOT 0.7  PROT 7.1  ALBUMIN 3.0*    Recent Labs  Lab 03/27/19 2011  LIPASE 43   No results for input(s): AMMONIA in the last 168 hours. Coagulation Profile: No results for input(s): INR, PROTIME in the last 168 hours. Cardiac Enzymes: Recent Labs  Lab 03/28/19 1323  TROPONINI <0.03   BNP (last 3 results) No results for input(s): PROBNP in the last 8760 hours. HbA1C: No results for input(s): HGBA1C in the last 72 hours. CBG: No results for input(s): GLUCAP in the last 168 hours. Lipid Profile: No results for input(s): CHOL, HDL, LDLCALC, TRIG, CHOLHDL, LDLDIRECT in the last 72 hours. Thyroid Function Tests: No results for input(s): TSH, T4TOTAL, FREET4, T3FREE, THYROIDAB in the last 72 hours. Anemia Panel: No results for input(s): VITAMINB12, FOLATE, FERRITIN, TIBC, IRON, RETICCTPCT in the last 72 hours. Sepsis Labs: No results for input(s): PROCALCITON, LATICACIDVEN in the last 168 hours.  No results found for this or any previous visit (from the past 240 hour(s)).  Radiology Studies: No results found.      Scheduled Meds:  acetaminophen  1,000 mg Oral Q8H   cyanocobalamin  1,000 mcg Subcutaneous Daily   enoxaparin (LOVENOX) injection  40 mg Subcutaneous Q24H   feeding supplement  237 mL Oral BID BM   folic acid  1 mg Oral Daily   lip balm  1 application Topical BID   magnesium oxide  400 mg Oral BID   multivitamins with iron  1 tablet Oral Daily   potassium chloride  20 mEq Oral BID   psyllium  1 packet Oral Daily   tamsulosin  0.4 mg Oral Daily   Continuous Infusions:  methocarbamol (ROBAXIN) IV       LOS: 2 days    Time spent: 35 minutes    Irine Seal, MD [facility]  If 7PM-7AM, please contact night-coverage [email or web address] 03/31/2019, 1:37 PM

## 2019-03-31 NOTE — Progress Notes (Deleted)
Physician Discharge Summary  Patient ID: Gerald Adkins MRN: [id number] DOB/AGE: 1956/07/19 63 y.o.  Admit date: 03/27/2019 Discharge date: 03/31/2019 Chief complaint:Patient fell at home around 1730 hrs. on 03/27/2019 and return to the ED with severe abdominal pain.   Admission Diagnoses:  Abdominal pain Metastatic malignant melanoma/multiple focal lymphovascular invasion S/p right hemicolectomy with small bowel resection 03/17/2019 Dr. Rolm Bookbinder Symptomatic anemia BPH/urinary retention Hypokalemia H/o CVA HTN HLD H/o metastatic melanoma - followed by Dr. 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  Work-up showed a bowel obstruction likely secondary to intussusception from metastatic melanoma.  He was taken to the operating room on 03/17/2019 and underwent right hemicolectomy and small bowel resection.  He had a postoperative ileus.  He was started on clears on his fifth postoperative day, NG was removed on 6 postoperative day and he was advanced to full liquids.  He developed nausea and vomiting after that.  He had issues with pain medications also.  He ultimately ended up on tramadol for oral pain control.  He had opening of his wound which was packed with  wet-to-dry's and he was ready for discharge on 03/26/19.  He returned to the ED at 6 PM on 03/27/2019.  With the above-noted complaints.  Work-up in the ED showed he was afebrile slightly tachycardic with a heart rate between 110 and 120.  Vital signs are stable sats were good on nasal cannula. Labs showed potassium was low at 3.0.  Glucose was slightly elevated at 116, WBC 10,000, hemoglobin 8.2, hematocrit 29.5 platelets 387,000. CT of the abdomen/pelvis with contrast obtained last evening showed postoperative changes from right hemicolectomy with partial small bowel resection, diffuse dilatation to the level of the surgical anastomosis favored to reflect postoperative ileus.  Small to moderate volume of free fluid within the abdomen pelvis with persistent small volume of intraperitoneal air findings were somewhat greater than expected for normal postoperative changes given the patient is 10 days postop, some concern for possible enteric leak, although nothing was visualized on the CT exam.  No loculated collections of intra-abdominal abscess identified.  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 Pulmonary nodule left lung slightly increased.  CT of the lumbar spine shows no evidence of acute abnormality within the lumbar spine, moderate multilevel degenerative spondylolysis with resultant moderate to severe spinal stenosis at L3- L4, L4-L5. T factorial degenerative changes with resultant advanced bilateral L5 foraminal stenosis.  No evidence of osseous metastasis.  He was readmitted last evening by Dr. Ninfa Linden. As noted above he remains mildly tachycardic.  No vital signs since 2 AM.  Labs shows potassium is up to 3.4, creatinine 0.42.  WBC 5.6, hemoglobin 6.7, hematocrit 24.2.  Platelets are normal.  Patient was admitted and after  doing exam found that he was having trouble voiding.  We did a bladder scan and he had over 800 cc on bladder scan.  We inserted a  Foley and his pain was essentially relieved.  He has chronic back pain for which he takes home medications including marijuana.  He reported he was eating normally at home despite what the study showed.  We put him on liquids he had ongoing loose bowel movements.  We advanced his diet.  We left the Foley in for 3 days and placed him on Flomax.  After 72 hours the Foley was removed and he is able to void.  He has a midline abdominal wound that is partially open but is clean.  His son is doing the dressing changes.  During his hospitalization he continue to use the IV Dilaudid for his backslash abdominal pain.  We discontinued that and have given him some oxycodone for discharge.  Also sending home on Flomax for his BPH.  Had symptomatic anemia and was transfused with 1 unit of packed cells.  He was seen in consultation by medicine and placed on folate, B12, and a multivitamin with iron.  We will continue those at home.  Dr. Lew Dawes office was contacted and he will see the patient as an outpatient for his melanoma.  Patient says he does not do morning appointments.  Was quickly changed his follow-up with Dr. Donne Hazel to 2 PM on 04/14/19.  He also needs to rearrange his follow-up with Dr. Julien Nordmann.  Have left instructions for him to call his primary care physician to follow-up for his anemia and BPH.  Condition on discharge: Improving  CBC Latest Ref Rng & Units 03/31/2019 03/30/2019 03/29/2019  WBC 4.0 - 10.5 K/uL - 8.4 6.4  Hemoglobin 13.0 - 17.0 g/dL 8.7(L) 8.7(L) 7.7(L)  Hematocrit 39.0 - 52.0 % 31.7(L) 30.9(L) 27.5(L)  Platelets 150 - 400 K/uL - 326 332   CMP Latest Ref Rng & Units 03/31/2019 03/30/2019 03/29/2019  Glucose 70 - 99 mg/dL 89 88 98  BUN 6 - 20 mg/dL 12 9 5(L)  Creatinine 0.61 - 1.24 mg/dL 0.38(L) 0.39(L) 0.48(L)  Sodium 135 - 145 mmol/L 132(L) 135 136  Potassium 3.5 - 5.1 mmol/L 4.0 4.0 3.5  Chloride 98 - 111 mmol/L 102 105 103  CO2 22 - 32 mmol/L 21(L) 25 25  Calcium 8.9 - 10.3 mg/dL  7.7(L) 7.5(L) 7.4(L)  Total Protein 6.5 - 8.1 g/dL - - -  Total Bilirubin 0.3 - 1.2 mg/dL - - -  Alkaline Phos 38 - 126 U/L - - -  AST 15 - 41 U/L - - -  ALT 0 - 44 U/L - - -   CT scan 03/27/2019 CT ABDOMEN AND PELVIS IMPRESSION  1. Postoperative changes from recent right hemicolectomy with partial small bowel resection. Diffuse small bowel dilatation to the level of the surgical anastomosis favored to reflect postoperative ileus, although an obstructive process would be difficult to exclude, and could be considered in the correct clinical setting. 2. Small moderate volume free fluid within the abdomen and pelvis with persistent small volume free intraperitoneal air. Findings are somewhat greater than expected for normal expected postoperative changes given the patient is postoperative day 10 from prior surgery. Findings raise the possibility for an enteric leak, although source is not definitely visualized on this exam. No loculated collections or intra-abdominal abscess identified. 3. Pulmonary nodules at the left lung base as above, slightly increased in size, suggesting mildly progressed disease. Scattered mediastinal  and upper abdominal adenopathy stable to slightly increased in size as well. 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 4. Aortic atherosclerosis.  CT LUMBAR SPINE IMPRESSION  1. No CT evidence for acute abnormality within the lumbar spine. 2. 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 3. 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 4. No evidence for osseous metastatic disease.   Disposition: Discharge disposition: 01-Home or Self Care        Allergies as of 03/31/2019      Reactions   Butterscotch Flavor Anaphylaxis, Shortness Of Breath      Medication List    STOP taking these medications    BC HEADACHE POWDER PO   traMADol 50 MG tablet Commonly known as:  Ultram     TAKE these medications   acetaminophen 500 MG tablet Commonly known as:  TYLENOL Take 2 tablets (1,000 mg total) by mouth every 8 (eight) hours.   BEANO PO Take 1 tablet by mouth daily as needed (gas).   folic acid 1 MG tablet Commonly known as:  FOLVITE Take 1 tablet (1 mg total) by mouth daily. Start taking on:  Apr 01, 2019   magnesium oxide 400 (241.3 Mg) MG tablet Commonly known as:  MAG-OX Take 1 tablet (400 mg total) by mouth 2 (two) times daily for 7 days.   multivitamins with iron Tabs tablet Take 1 tablet by mouth daily. Start taking on:  Apr 01, 2019   oxyCODONE 5 MG immediate release tablet Commonly known as:  Oxy IR/ROXICODONE Take 1 tablet (5 mg total) by mouth every 4 (four) hours as needed for moderate pain, severe pain or breakthrough pain.   tamsulosin 0.4 MG Caps capsule Commonly known as:  FLOMAX Take 1 capsule (0.4 mg total) by mouth daily. Start taking on:  Apr 01, 2019   vitamin B-12 1000 MCG tablet Commonly known as:  CYANOCOBALAMIN Take 1 tablet (1,000 mcg total) by mouth daily.            Durable Medical Equipment  (From admission, onward)         Start     Ordered   03/30/19 1459  For home use only DME Walker rolling  Once    Comments:  Rolling walker with 5" wheels   To help patient transfer and ambulate.  Physical / Occupational Therapy may change type of walker PRN.  Question Answer Comment  Patient needs a walker to treat with the following condition Metastatic malignant melanoma [facility])   Patient needs a walker to treat with the following condition History of recent fall      03/30/19 1459         Follow-up Information    Rolm Bookbinder, MD Follow up on 04/14/2019.   Specialty:  General Surgery Why:  Your appointment has been changed to 2:00PM on 04/14/19.  Be at the office 30 minutes early for check in.  Bring photo ID and insurance  information.   Contact information: [address] [place] [address] [phone]        [name], MD Follow up.   Specialty:  Internal Medicine Why:  Call and let him care for your anemia and your enlarged prostate.  We placed you on Flomax(tamulosin) for this in the hospital.  Dr. Jeanie Cooks will need to renew it. Contact information: [address] [place] [address] [phone]  Curt Bears, MD Follow up.   Specialty:  Oncology Why:  call for a follow up appointment  Contact information: [place] [address] [phone]           Signed: Earnstine Regal 03/31/2019, 2:26 PM

## 2019-03-31 NOTE — Progress Notes (Signed)
Pt was discharged home today. Instructions were reviewed with patient, and questions were answered. Pt was taken to main entrance via wheelchair by NT.  

## 2019-03-31 NOTE — Progress Notes (Signed)
CC: Abdominal pain after surgery/falling  Subjective: He is voiding without difficulty.  Planing of watery stools and currently getting MiraLAX.  Midline wound looks good.  Hemoglobin hematocrit are stable.    Objective: Vital signs in last 24 hours: Temp:  [97.6 F (36.4 C)-98.4 F (36.9 C)] 97.6 F (36.4 C) (04/30 0459) Pulse Rate:  [115-117] 116 (04/30 0459) Resp:  [16-18] 18 (04/30 0459) BP: (95-121)/(67-82) 121/82 (04/30 0459) SpO2:  [91 %-95 %] 95 % (04/30 0459) Last BM Date: 03/30/19 580 PO 300 IV 875 urine BM x 4 Afebrile, VSS Still tachycardic Labs are stable He is taking Dilaudid 4 times a day IV he says for his back and his stomach.  We will stop today.  Intake/Output from previous day: 04/29 0701 - 04/30 0700 In: 886.9 [P.O.:580; IV Piggyback:6.9] Out: 875 [Urine:875] Intake/Output this shift: Total I/O In: 0  Out: 500 [Urine:500]  General appearance: alert, cooperative and no distress Resp: clear to auscultation bilaterally GI: Soft, sore, midline incision looks fine.  Reports loose stools.  Tolerating diet well.  Still has some pain in his back and his abdomen.  Lab Results:  Recent Labs    03/29/19 0436 03/30/19 0347 03/31/19 0427  WBC 6.4 8.4  --   HGB 7.7* 8.7* 8.7*  HCT 27.5* 30.9* 31.7*  PLT 332 326  --     BMET Recent Labs    03/30/19 0347 03/31/19 0427  NA 135 132*  K 4.0 4.0  CL 105 102  CO2 25 21*  GLUCOSE 88 89  BUN 9 12  CREATININE 0.39* 0.38*  CALCIUM 7.5* 7.7*   PT/INR No results for input(s): LABPROT, INR in the last 72 hours.  Recent Labs  Lab 03/27/19 2011  AST 15  ALT 12  ALKPHOS 95  BILITOT 0.7  PROT 7.1  ALBUMIN 3.0*     Lipase     Component Value Date/Time   LIPASE 43 03/27/2019 2011     Medications: . acetaminophen  1,000 mg Oral Q8H  . cyanocobalamin  1,000 mcg Subcutaneous Daily  . enoxaparin (LOVENOX) injection  40 mg Subcutaneous Q24H  . feeding supplement  237 mL Oral BID BM  .  folic acid  1 mg Oral Daily  . lip balm  1 application Topical BID  . magnesium oxide  400 mg Oral BID  . multivitamins with iron  1 tablet Oral Daily  . potassium chloride  20 mEq Oral BID  . psyllium  1 packet Oral Daily  . tamsulosin  0.4 mg Oral Daily    Assessment/Plan Symptomatic anemia BPH/urinary retention- 800cc on bladder scan Ongoing marijuana use Fall with abdominal pain after marijuana use Possible fluid overload Hypokalemia - being replaced H/o CVA HTN HLD H/o metastatic melanoma - followed by Dr. Mckinley Jewel Hx chronic back pain Hx opiate addiction Hx tobacco use-none x 23 years Hx marijuana use Hx EtOH use  Metastatic malignant melanoma/multiple foci lymphovascular invasion was identified; metastatic melanoma in 1 of 21 lymph nodes Right hemicolectomy with small bowel resection 03/17/2019 Dr. Rolm Bookbinder  FEN: soft diet ID: None [facility]:[id number] [facility] -[phone]  Plan:  If OK with Medicine he can go home.  He is voiding without the foley.  I will stop the dilaudid, continue the Flomax.  Will talk with Medicine about discharge Medicines.  He is to follow up with Dr. Earlie Server & Donne Hazel. He is still a little tachycardic.       LOS: 2 days    Camila Maita  03/31/2019 [phone]  

## 2019-04-01 NOTE — Discharge Summary (Signed)
Physician Discharge Summary  Patient ID: Gerald Adkins MRN: [id number] DOB/AGE: Aug 09, 1956 63 y.o.  Admit date: 03/27/2019 Discharge date: 03/31/2019 Chief complaint:Patient fell at home around 1730 hrs. on 03/27/2019 and return to the ED with severe abdominal pain.   Admission Diagnoses:  Abdominal pain Metastatic malignant melanoma/multiple focal lymphovascular invasion S/p right hemicolectomy with small bowel resection 03/17/2019 Dr. Rolm Bookbinder Symptomatic anemia BPH/urinary retention Hypokalemia H/o CVA HTN HLD H/o metastatic melanoma - followed by Dr. 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 Work-up showed a bowel obstruction likely secondary to intussusception from metastatic melanoma. He was taken to the operating room on 03/17/2019 and underwent right hemicolectomy and small bowel resection.He had a postoperative ileus.He was started on clears on his fifth postoperative day, NG was removed on 6 postoperative day and he was advanced to full liquids. He developed nausea and vomiting after that. He had issues with pain medications also. He ultimately ended up on tramadol for oral pain control. He had opening of his wound which was packed  with wet-to-dry's and he was ready for discharge on 03/26/19.He returned to the ED at 6 PM on 03/27/2019. With the above-noted complaints.  Work-up in the ED showed he was afebrile slightly tachycardic with a heart rate between 110 and 120. Vital signs are stable sats were good on nasal cannula. Labs showed potassium was low at 3.0. Glucose was slightly elevated at 116, WBC 10,000, hemoglobin 8.2, hematocrit 29.5 platelets 387,000. CT of the abdomen/pelvis with contrast obtained last evening showed postoperative changes from right hemicolectomy with partial small bowel resection, diffuse dilatation to the level of the surgical anastomosis favored to reflect postoperative ileus. Small to moderate volume of free fluid within the abdomen pelvis with persistent small volume of intraperitoneal air findings were somewhat greater than expected for normal postoperative changes given the patient is 10 days postop, some concern for possible enteric leak, although nothing was visualized on the CT exam. No loculated collections of intra-abdominal abscess identified.  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 Pulmonary nodule left lung slightly increased.  CT of the lumbar spine shows no evidence of acute abnormality within the lumbar spine, moderate multilevel degenerative spondylolysis with resultant moderate to severe spinal stenosis at L3-L4, L4-L5. T factorial degenerative changes with resultant advanced bilateral L5 foraminal stenosis. No evidence of osseous metastasis.  He was readmitted last evening by Dr. Ninfa Linden. As noted above he remains mildly tachycardic. No vital signs since 2 AM. Labs shows potassium is up to 3.4, creatinine 0.42. WBC 5.6, hemoglobin 6.7, hematocrit 24.2. Platelets are normal.  Patient was admitted and after doing exam found that he was having trouble voiding.  We did a bladder scan and he had over 800 cc on bladder scan.  We  inserted a Foley and his pain was essentially relieved.  He has chronic back pain for which he takes home medications including marijuana.  He reported he was eating normally at home despite what the study showed.  We put him on liquids he had ongoing loose bowel movements.  We advanced his diet.  We left the Foley in for 3 days and placed him on Flomax.  After 72 hours the Foley was removed and he is able to void.  He has a midline abdominal wound that is partially open but is clean.  His son is doing the dressing changes.  During his hospitalization he continue to use the IV Dilaudid for his backslash abdominal pain.  We discontinued that and have given him some oxycodone for discharge.  Also sending home on Flomax for his BPH.  Had symptomatic anemia and was transfused with 1 unit of packed cells.  He was seen in consultation by medicine and placed on folate, B12, and a multivitamin with iron.  We will continue those at home.  Dr. Lew Dawes office was contacted and he will see the patient as an outpatient for his melanoma.  Patient says he does not do morning appointments.  Was quickly changed his follow-up with Dr. Donne Hazel to 2 PM on 04/14/19.  He also needs to rearrange his follow-up with Dr. Julien Nordmann.  Have left instructions for him to call his primary care physician to follow-up for his anemia and BPH.  Condition on discharge: Improving  CBC Latest Ref Rng & Units 03/31/2019 03/30/2019 03/29/2019  WBC 4.0 - 10.5 K/uL - 8.4 6.4  Hemoglobin 13.0 - 17.0 g/dL 8.7(L) 8.7(L) 7.7(L)  Hematocrit 39.0 - 52.0 % 31.7(L) 30.9(L) 27.5(L)  Platelets 150 - 400 K/uL - 326 332   CMP Latest Ref Rng & Units 03/31/2019 03/30/2019 03/29/2019  Glucose 70 - 99 mg/dL 89 88 98  BUN 6 - 20 mg/dL 12 9 5(L)  Creatinine 0.61 - 1.24 mg/dL 0.38(L) 0.39(L) 0.48(L)  Sodium 135 - 145 mmol/L 132(L) 135 136  Potassium 3.5 - 5.1 mmol/L 4.0 4.0 3.5  Chloride 98 - 111 mmol/L 102 105 103  CO2 22 - 32 mmol/L 21(L) 25 25  Calcium  8.9 - 10.3 mg/dL 7.7(L) 7.5(L) 7.4(L)  Total Protein 6.5 - 8.1 g/dL - - -  Total Bilirubin 0.3 - 1.2 mg/dL - - -  Alkaline Phos 38 - 126 U/L - - -  AST 15 - 41 U/L - - -  ALT 0 - 44 U/L - - -   CT scan 03/27/2019 CT ABDOMEN AND PELVIS IMPRESSION  1. Postoperative changes from recent right hemicolectomy with partial small bowel resection. Diffuse small bowel dilatation to the level of the surgical anastomosis favored to reflect postoperative ileus, although an obstructive process would be difficult to exclude, and could be considered in the correct clinical setting. 2. Small moderate volume free fluid within the abdomen and pelvis with persistent small volume free intraperitoneal air. Findings are somewhat greater than expected for normal expected postoperative changes given the patient is postoperative day 10 from prior surgery. Findings raise the possibility for an enteric leak, although source is not definitely visualized on this exam. No loculated collections or intra-abdominal abscess identified. 3. Pulmonary nodules at the left lung base as above, slightly increased in size, suggesting mildly progressed disease. Scattered mediastinal and upper abdominal adenopathy stable to slightly increased in size as well. 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 4. Aortic atherosclerosis.  CT LUMBAR SPINE IMPRESSION  1. No CT evidence for acute abnormality within the lumbar spine. 2. 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 3. 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 4. No evidence for osseous metastatic disease.   Disposition: Discharge disposition: 01-Home or Self Care            Allergies as of 03/31/2019      Reactions   Butterscotch Flavor Anaphylaxis, Shortness Of Breath                 Medication List        STOP taking these medications       BC HEADACHE POWDER PO   traMADol 50 MG tablet Commonly known as:  Ultram             TAKE these medications       acetaminophen 500 MG tablet Commonly known as:  TYLENOL Take 2 tablets (1,000 mg total) by mouth every 8 (eight) hours.   BEANO PO Take 1 tablet by mouth daily as needed (gas).   folic acid 1 MG tablet Commonly known as:  FOLVITE Take 1 tablet (1 mg total) by mouth daily. Start taking on:  Apr 01, 2019   magnesium oxide 400 (241.3 Mg) MG tablet Commonly known as:  MAG-OX Take 1 tablet (400 mg total) by mouth 2 (two) times daily for 7 days.   multivitamins with iron Tabs tablet Take 1 tablet by mouth daily. Start taking on:  Apr 01, 2019   oxyCODONE 5 MG immediate release tablet Commonly known as:  Oxy IR/ROXICODONE Take 1 tablet (5 mg total) by mouth every 4 (four) hours as needed for moderate pain, severe pain or breakthrough pain.   tamsulosin 0.4 MG Caps capsule Commonly known as:  FLOMAX Take 1 capsule (0.4 mg total) by mouth daily. Start taking on:  Apr 01, 2019   vitamin B-12 1000 MCG tablet Commonly known as:  CYANOCOBALAMIN Take 1 tablet (1,000 mcg total) by mouth daily.                        Durable Medical Equipment  (From admission, onward)                        Start     Ordered    03/30/19 1459  For home use only DME Walker rolling  Once    Comments:  Rolling walker with 5" wheels   To help patient transfer and ambulate.  Physical / Occupational Therapy may change type of walker PRN.  Question Answer Comment  Patient needs a walker to treat with the following condition Metastatic malignant melanoma [facility])   Patient needs a walker to treat with the following condition History of recent fall      03/30/19 1459              Follow-up Information    Rolm Bookbinder, MD Follow up on 04/14/2019.   Specialty:  General  Surgery Why:  Your appointment has been changed to 2:00PM on 04/14/19.  Be at the office 30 minutes early for check in.  Bring photo ID and insurance information.   Contact information: [address] [place] [address] [phone]        [name], MD Follow up.   Specialty:  Internal Medicine Why:  Call and let him care  for your anemia and your enlarged prostate.  We placed you on Flomax(tamulosin) for this in the hospital.  Dr. Jeanie Cooks will need to renew it. Contact information: [place] [address] [phone]        Curt Bears, MD Follow up.   Specialty:  Oncology Why:  call for a follow up appointment  Contact information: [place] [address] [phone]           Signed: Earnstine Regal 03/31/2019, 2:26 PM          Cosigned by: Michael Boston, MD at 03/31/2019 3:13 PM  Revision History                   Routing History

## 2019-04-04 ENCOUNTER — Telehealth: Payer: Self-pay | Admitting: Internal Medicine

## 2019-04-04 NOTE — Telephone Encounter (Signed)
Rescheduled patient's appt to tomorrow due to him wanting an afternoon appt. His son said he would be able to transport him.

## 2019-04-05 ENCOUNTER — Other Ambulatory Visit: Payer: Self-pay

## 2019-04-05 ENCOUNTER — Encounter: Payer: Self-pay | Admitting: Internal Medicine

## 2019-04-05 ENCOUNTER — Inpatient Hospital Stay: Payer: Medicare Other | Attending: Internal Medicine

## 2019-04-05 ENCOUNTER — Inpatient Hospital Stay (HOSPITAL_BASED_OUTPATIENT_CLINIC_OR_DEPARTMENT_OTHER): Payer: Medicare Other | Admitting: Internal Medicine

## 2019-04-05 VITALS — BP 112/79 | HR 95 | Temp 97.5°F | Resp 18 | Ht 73.0 in | Wt 168.3 lb

## 2019-04-05 DIAGNOSIS — Z9119 Patient's noncompliance with other medical treatment and regimen: Secondary | ICD-10-CM

## 2019-04-05 DIAGNOSIS — R222 Localized swelling, mass and lump, trunk: Secondary | ICD-10-CM | POA: Insufficient documentation

## 2019-04-05 DIAGNOSIS — C799 Secondary malignant neoplasm of unspecified site: Secondary | ICD-10-CM

## 2019-04-05 DIAGNOSIS — Z79899 Other long term (current) drug therapy: Secondary | ICD-10-CM

## 2019-04-05 DIAGNOSIS — D638 Anemia in other chronic diseases classified elsewhere: Secondary | ICD-10-CM

## 2019-04-05 DIAGNOSIS — Z85820 Personal history of malignant melanoma of skin: Secondary | ICD-10-CM | POA: Diagnosis not present

## 2019-04-05 DIAGNOSIS — Z923 Personal history of irradiation: Secondary | ICD-10-CM

## 2019-04-05 DIAGNOSIS — K6389 Other specified diseases of intestine: Secondary | ICD-10-CM

## 2019-04-05 DIAGNOSIS — R5382 Chronic fatigue, unspecified: Secondary | ICD-10-CM

## 2019-04-05 DIAGNOSIS — Z7189 Other specified counseling: Secondary | ICD-10-CM

## 2019-04-05 DIAGNOSIS — C439 Malignant melanoma of skin, unspecified: Secondary | ICD-10-CM

## 2019-04-05 DIAGNOSIS — C785 Secondary malignant neoplasm of large intestine and rectum: Secondary | ICD-10-CM

## 2019-04-05 LAB — CMP (CANCER CENTER ONLY)
ALT: 14 U/L (ref 0–44)
AST: 19 U/L (ref 15–41)
Albumin: 2.3 g/dL — ABNORMAL LOW (ref 3.5–5.0)
Alkaline Phosphatase: 86 U/L (ref 38–126)
Anion gap: 8 (ref 5–15)
BUN: 6 mg/dL (ref 6–20)
CO2: 28 mmol/L (ref 22–32)
Calcium: 8 mg/dL — ABNORMAL LOW (ref 8.9–10.3)
Chloride: 102 mmol/L (ref 98–111)
Creatinine: 0.55 mg/dL — ABNORMAL LOW (ref 0.61–1.24)
GFR, Est AFR Am: >60 mL/min (ref >60)
GFR, Estimated: >60 mL/min (ref >60)
Glucose, Bld: 88 mg/dL (ref 70–99)
Potassium: 3.9 mmol/L (ref 3.5–5.1)
Sodium: 138 mmol/L (ref 135–145)
Total Bilirubin: <0.2 mg/dL — ABNORMAL LOW (ref 0.3–1.2)
Total Protein: 5.6 g/dL — ABNORMAL LOW (ref 6.5–8.1)

## 2019-04-05 LAB — CBC WITH DIFFERENTIAL (CANCER CENTER ONLY)
Abs Immature Granulocytes: 0.06 10*3/uL (ref 0.00–0.07)
Basophils Absolute: 0.1 10*3/uL (ref 0.0–0.1)
Basophils Relative: 1 %
Eosinophils Absolute: 0.3 10*3/uL (ref 0.0–0.5)
Eosinophils Relative: 6 %
HCT: 34.9 % — ABNORMAL LOW (ref 39.0–52.0)
Hemoglobin: 9.6 g/dL — ABNORMAL LOW (ref 13.0–17.0)
Immature Granulocytes: 1 %
Lymphocytes Relative: 15 %
Lymphs Abs: 0.9 10*3/uL (ref 0.7–4.0)
MCH: 21.1 pg — ABNORMAL LOW (ref 26.0–34.0)
MCHC: 27.5 g/dL — ABNORMAL LOW (ref 30.0–36.0)
MCV: 76.5 fL — ABNORMAL LOW (ref 80.0–100.0)
Monocytes Absolute: 0.5 10*3/uL (ref 0.1–1.0)
Monocytes Relative: 9 %
Neutro Abs: 4 10*3/uL (ref 1.7–7.7)
Neutrophils Relative %: 68 %
Platelet Count: 311 10*3/uL (ref 150–400)
RBC: 4.56 MIL/uL (ref 4.22–5.81)
RDW: 27.7 % — ABNORMAL HIGH (ref 11.5–15.5)
WBC Count: 5.9 10*3/uL (ref 4.0–10.5)
nRBC: 0 % (ref 0.0–0.2)

## 2019-04-05 LAB — TSH: TSH: 2.635 u[IU]/mL (ref 0.320–4.118)

## 2019-04-05 NOTE — Progress Notes (Signed)
[name]:([phone]) [phone]   Fax:([phone]) [phone]  OFFICE PROGRESS NOTE  [name], MD [place] [address]  DIAGNOSIS: Metastatic malignant melanoma (T0, N0, M1b) was negative.  Mutation presented with large mass centered on the right upper mediastinum and medial right upper lobe with invasion and narrowing of the superior vena cava as well as occlusion of the right upper lobe pulmonary artery and narrowing of the right lower lobe pulmonary artery diagnosed in September 2019 and clinical presentation of SVC syndrome.  PRIOR THERAPY: Palliative radiotherapy to the large right upper lobe lung mass and mediastinum under the care of Dr. Sondra Come.  CURRENT THERAPY: Treatment with immunotherapy with Keytruda 200 mg IV every 3 weeks.  First dose September 29, 2018.  Status post 1 cycle.  The patient refused any further treatment.  INTERVAL HISTORY: Gerald Adkins 63 y.o. male returns to the clinic today for reevaluation after recent hospitalization with a small bowel obstruction.  He underwent a right hemicolectomy with a small bowel resection on March 17, 2019.  The final pathology was consistent with metastatic melanoma.  He had imaging studies during his hospitalization including CT angiogram of the chest as well as CT scan of the abdomen and pelvis and it showed small bowel obstruction with abrupt transition point in the midline abdomen at the level of the umbilicus due to ileoileal intussusception likely secondary to small bowel mass which is likely metastatic disease.  There was a second intussusception in the right lower quadrant as there is an ileocolic intussusception extending to the hepatic flexure likely due to multiple bowel masses.  This scan also showed significant decrease in the size of the anterior mediastinal mass with a residual mass measuring 2.9 x 3.9 cm compared to 13.1 x 10.5 cm previously.  There was also a decrease in the size of the  right hilar component of this mass and there was a 1.4 cm low left infrahilar lymph node.  There was new metastatic foci abutting the right and left heart border.  There was resolution of several small right pulmonary nodules with a stable and increased size of left pulmonary nodules.  The scan also showed 2 new hypodense liver masses with the largest measuring 2.3 cm over the right lobe likely metastatic disease.  There was new 1.8 cm left adrenal nodule likely metastatic disease and worsening adenopathy in the region of the celiac axis and portacaval region likely metastatic disease.  The patient is here today for evaluation and discussion of his treatment options.  He denied having any current chest pain, shortness of breath, cough or hemoptysis.  He denied having any fever or chills.  He has no nausea, vomiting, diarrhea or constipation.  He has no headache or visual changes.  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  MEDICATIONS:  Current Outpatient Medications  Medication Sig Dispense Refill   acetaminophen (TYLENOL) 500 MG tablet Take 2 tablets (1,000 mg total) by mouth every 8 (eight) hours. 30 tablet 0   Alpha-D-Galactosidase (BEANO PO) Take 1 tablet by mouth daily as needed (gas).     folic acid (FOLVITE) 1 MG tablet Take 1 tablet (1 mg total) by mouth  daily.     magnesium oxide (MAG-OX) 400 (241.3 Mg) MG tablet Take 1 tablet (400 mg total) by mouth 2 (two) times daily for 7 days. 14 tablet 0   Multiple Vitamins-Iron (MULTIVITAMINS WITH IRON) TABS tablet Take 1 tablet by mouth daily.  0   oxyCODONE (OXY IR/ROXICODONE) 5 MG immediate release tablet Take 1 tablet (5 mg total) by mouth every 4 (four) hours as needed for moderate pain, severe pain or breakthrough pain. 20  tablet 0   tamsulosin (FLOMAX) 0.4 MG CAPS capsule Take 1 capsule (0.4 mg total) by mouth daily. 30 capsule 1   vitamin B-12 (CYANOCOBALAMIN) 1000 MCG tablet Take 1 tablet (1,000 mcg total) by mouth daily. 30 tablet 0   No current facility-administered medications for this visit.     SURGICAL HISTORY:  Past Surgical History:  Procedure Laterality Date   APPENDECTOMY     BACK SURGERY     lumbar spine, cut sciatic nerve   CHOLECYSTECTOMY     LAPAROTOMY N/A 03/17/2019   Procedure: EXPLORATORY LAPAROTOMY SMALL BOWEL AND RIGHT COLON RESECTION;  Surgeon: Rolm Bookbinder, MD;  Location: [facility];  Service: General;  Laterality: N/A;   SCALENE NODE BIOPSY Right 08/23/2018   Procedure: BIOPSY OF RIGHT SCALENE LYMPH NODE;  Surgeon: Grace Isaac, MD;  Location: [facility];  Service: Thoracic;  Laterality: Right;    REVIEW OF SYSTEMS:  Constitutional: positive for fatigue and weight loss Eyes: negative Ears, nose, mouth, throat, and face: negative Respiratory: negative Cardiovascular: negative Gastrointestinal: negative Genitourinary:negative Integument/breast: negative Hematologic/lymphatic: negative Musculoskeletal:negative Neurological: negative Behavioral/Psych: negative Endocrine: negative Allergic/Immunologic: negative   PHYSICAL EXAMINATION: General appearance: alert, cooperative, fatigued and no distress Head: Normocephalic, without obvious abnormality, atraumatic Neck: no adenopathy, no JVD, supple, symmetrical, trachea midline and thyroid not enlarged, symmetric, no tenderness/mass/nodules Lymph nodes: Cervical, supraclavicular, and axillary nodes normal. Resp: diminished breath sounds RUL and dullness to percussion RUL Back: symmetric, no curvature. ROM normal. No CVA tenderness. Cardio: regular rate and rhythm, S1, S2 normal, no murmur, click, rub or gallop GI: soft, non-tender; bowel sounds normal; no masses,  no organomegaly Extremities: extremities normal,  atraumatic, no cyanosis or edema Neurologic: Alert and oriented X 3, normal strength and tone. Normal symmetric reflexes. Normal coordination and gait  ECOG PERFORMANCE STATUS: 1 - Symptomatic but completely ambulatory  Blood pressure 112/79, pulse 95, temperature (!) 97.5 F (36.4 C), temperature source Oral, resp. rate 18, height '6\' 1"'$  (1.854 m), weight 168 lb 4.8 oz (76.3 kg), SpO2 100 %.  LABORATORY DATA: Lab Results  Component Value Date   WBC 8.4 03/30/2019   HGB 8.7 (L) 03/31/2019   HCT 31.7 (L) 03/31/2019   MCV 72.9 (L) 03/30/2019   PLT 326 03/30/2019      Chemistry      Component Value Date/Time   NA 132 (L) 03/31/2019 0427   K 4.0 03/31/2019 0427   CL 102 03/31/2019 0427   CO2 21 (L) 03/31/2019 0427   BUN 12 03/31/2019 0427   CREATININE 0.38 (L) 03/31/2019 0427   CREATININE 0.59 (L) 09/29/2018 1427      Component Value Date/Time   CALCIUM 7.7 (L) 03/31/2019 0427   ALKPHOS 95 03/27/2019 2011   AST 15 03/27/2019 2011   AST 11 (L) 09/29/2018 1427   ALT 12 03/27/2019 2011   ALT 9 09/29/2018 1427   BILITOT 0.7 03/27/2019 2011   BILITOT 0.2 (L) 09/29/2018 1427       RADIOGRAPHIC STUDIES: Dg Chest 2 View  Result Date: 03/28/2019 CLINICAL DATA:  Postoperative state. EXAM: CHEST - 2 VIEW COMPARISON:  CT scan and radiographs of August 30, 2018. FINDINGS: Stable cardiomegaly. Right paratracheal mass is significantly smaller. No pneumothorax is noted. Elevated right hemidiaphragm is noted. Left lung is clear. Bony thorax is unremarkable. IMPRESSION: Right paratracheal mass noted on prior exam is significantly smaller. Elevated right hemidiaphragm is noted with probable small right pleural effusion. No other significant abnormality seen in the chest. Electronically Signed   By: Marijo Conception M.D.   On: 03/28/2019 13:06   Dg Abd 1 View  Result Date: 03/19/2019 CLINICAL DATA:  NG tube placement EXAM: ABDOMEN - 1 VIEW COMPARISON:  None. FINDINGS: An NG tube is  identified with side hole and tip overlying the proximal stomach IMPRESSION: NG tube within the proximal stomach. Electronically Signed   By: Margarette Canada M.D.   On: 03/19/2019 16:23   Dg Abd 1 View  Result Date: 03/18/2019 CLINICAL DATA:  Check gastric catheter placement EXAM: ABDOMEN - 1 VIEW COMPARISON:  None. FINDINGS: Gastric catheter is noted extending into the stomach. Proximal side port appears to lie within the distal esophagus. Scattered small bowel dilatation is again noted. IMPRESSION: Gastric catheter as described. This could be advanced several cm further into the stomach. Electronically Signed   By: Inez Catalina M.D.   On: 03/18/2019 21:07   Ct Angio Chest Pe W And/or Wo Contrast  Result Date: 03/16/2019 CLINICAL DATA:  Weakness with lower abdominal pain 1 day. Diagnosed with lung cancer last year. EXAM: CT ANGIOGRAPHY CHEST CT ABDOMEN AND PELVIS WITH CONTRAST TECHNIQUE: Multidetector CT imaging of the chest was performed using the standard protocol during bolus administration of intravenous contrast. Multiplanar CT image reconstructions and MIPs were obtained to evaluate the vascular anatomy. Multidetector CT imaging of the abdomen and pelvis was performed using the standard protocol during bolus administration of intravenous contrast. CONTRAST:  1100m OMNIPAQUE IOHEXOL 350 MG/ML SOLN COMPARISON:  Chest CT 08/30/2018 and abdominal CT 06/30/2016 FINDINGS: CTA CHEST FINDINGS Cardiovascular: Heart is normal size. Thoracic aorta is normal in caliber with minimal calcified plaque present. Pulmonary arterial system is within normal. Persistent obstruction of the right upper lobar arteries. Remaining vascular structures are unremarkable. Mediastinum/Nodes: Significant decrease in size in patient's previously seen anterior mediastinal mass over the right side of the anterior mediastinum with mild residual density in this region extending down to the right hilum. This residual mass measures  approximately 2.9 x 3.9 cm in transverse in AP dimension (previously 13.1 x 10.5 cm). The right hilar component of this mass measures 2.2 cm in AP dimension (previously 5.7 cm). 1.4 cm left infrahilar node. New 2.4 cm soft tissue nodule abutting the right heart border and new 2 cm soft tissue nodule abutting the left anterolateral heart border just above the level of the ventricular septum. Lungs/Pleura: Lungs are adequately inflated and demonstrate resolution of several small right-sided pulmonary nodules. There is new opacification with bronchiectatic change over the medial right lung with areas of nodularity likely post radiation change. Stable nodule over the medial left upper lobe measuring 12 x 15 mm. Increase in size of a nodule over the posterior left lower lobe measuring 13 mm. Minimal emphysematous disease. No evidence of pleural effusion. Musculoskeletal: Mild degenerative change of the spine. Review of the MIP images confirms the above findings. CT ABDOMEN and PELVIS FINDINGS Hepatobiliary: 2.3 cm hypodense mass over the posterior segment right lobe of the liver as well as 1.3 cm hypodense  mass over the anterior right lobe of the liver likely representing metastatic disease. Subcentimeter hypodensity over the right lobe which may represent a small cyst versus metastatic disease. Previous cholecystectomy. Biliary tree is unremarkable. Pancreas: Normal Spleen: Normal. Adrenals/Urinary Tract: New 1.8 cm left adrenal nodule likely metastatic focus. Right adrenal gland is normal. Kidneys are normal in size without hydronephrosis or nephrolithiasis. There are a couple subcentimeter right renal cortical hypodensities too small to characterize but likely cysts. Ureters and bladder are normal. Stomach/Bowel: Stomach is within normal. There are multiple fluid and air-filled dilated small bowel loops measuring up to 5.4 cm in diameter. There is an abrupt transition 0.8 in the midline abdomen at the level of the  umbilicus with there is an ileo ileal intussusception possibly due to a small lead point mass. There is a second ileocolic intussusception in the right lower quadrant likely due to additional bowel masses. This ileocolic intussusception extends to the hepatic flexure. The colon is decompressed distal to the hepatic flexure. Vascular/Lymphatic: Moderate calcified plaque over the abdominal aorta. Portacaval lymph node measuring 2.2 cm in diameter with adjacent celiac axis node measuring 1.8 cm in diameter. Reproductive: Normal. Other: No significant free fluid or free peritoneal air. Musculoskeletal: Degenerative change of the spine and hips. Review of the MIP images confirms the above findings. IMPRESSION: 1. Small bowel obstruction with abrupt transition point in the midline abdomen at the level of the umbilicus due to ileo ileal intussusception likely secondary to small bowel mass which is likely metastatic disease. There is a second intussusception in the right lower quadrant as there is an ileocolic intussusception extending to the hepatic flexure likely due to multiple bowel masses. 2. Significant decrease in size of patient's anterior mediastinal mass as the residual mass measures approximately 2.9 x 3.9 cm (previously 13.1 x 10.5 cm). Decreased size of right hilar component of this mass. 1.4 cm low left infrahilar lymph node. New metastatic foci abutting the right and left heart border as described. Resolution of several small right pulmonary nodules with stable and increased size of left pulmonary nodules as described. 3. 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 4. Two new hypodense liver masses with the larger measuring 2.3 cm over the right lobe likely metastatic disease. New 1.8 cm left adrenal nodule likely metastatic disease. Worsening adenopathy in the region of the celiac axis and portacaval region likely metastatic disease. 5. A few  subcentimeter right renal cortical hypodensities too small to characterize but likely cysts. 6. Aortic Atherosclerosis (ICD10-I70.0) and Emphysema (ICD10-J43.9). These results were called by telephone at the time of interpretation on 03/16/2019 at 5:15 pm to PA, [facility] , who verbally acknowledged these results. Electronically Signed   By: Marin Olp M.D.   On: 03/16/2019 17:16   Ct Abdomen Pelvis W Contrast  Result Date: 03/27/2019 CLINICAL DATA:  Initial evaluation for acute abdominal and back pain. EXAM: CT ABDOMEN AND PELVIS WITHOUT CONTRAST TECHNIQUE: Multidetector CT imaging of the abdomen and pelvis was performed following the standard protocol without IV contrast. COMPARISON:  Comparison made with prior CT from 03/16/2019. FINDINGS: CT ABDOMEN AND PELVIS Lower chest: Small layering right pleural effusion with associated atelectasis. Probable postradiation changes partially visualize within the right perihilar region, grossly similar. 2.8 cm pleural base nodule at the lingula is increased in size (series 6, image 21). Additional 15 mm left lower lobe nodule also increased in size (series 6, image 25). 12 mm node noted near the  AE recess, similar. Right cardiophrenic implant measures up to 2.6 cm, perhaps slightly increased in size from previous. Trace pericardial effusion noted. Hepatobiliary: 2.3 cm hypodense lesion at the right hepatic dome noted, stable, concerning for metastatic disease. Additional 15 mm lesion slightly inferiorly within the right hepatic lobe also similar. Additional subcentimeter hypodensity within the right hepatic lobe too small the characterize and could reflect a small cyst versus metastatic implant, stable from previous. Gallbladder surgically absent. Mild scattered intrahepatic biliary dilatation similar to previous. Pancreas: Pancreas within normal limits. Spleen: Spleen within normal limits. Adrenals/Urinary Tract: Right adrenal gland is normal. 19 mm left adrenal  nodule noted, stable. Kidneys equal in size with symmetric enhancement. No nephrolithiasis, hydronephrosis or focal enhancing renal mass. No hydroureter. Bladder within normal limits. Stomach/Bowel: Stomach largely decompressed without acute finding. Postoperative changes from interval right hemicolectomy with partial small bowel resection and ileocolonic anastomosis. Small bowel is diffusely dilated and fluid-filled to the level of the surgical anastomosis within the right lower quadrant, measuring up to 5 cm in maximal diameter. Few scattered internal air-fluid levels. Mild gaseous distension of the colon. Imaging findings favored to reflect postoperative ileus. Few scattered colonic diverticula noted without evidence for acute diverticulitis. Small to moderate volume free fluid present within the abdomen and pelvis, measuring simple fluid density. Additionally, scattered small volume free air seen within the abdomen, somewhat greater than expected for approximately 10 days post surgery. Findings raise the possibility for an ongoing leak, source not identified. No discrete loculated collections or intra-abdominal abscess. Vascular/Lymphatic: Moderate to advanced aorto bi-iliac atherosclerotic disease. Infrarenal aorta ectatic measuring up to 2.8 cm in transverse diameter. Mesenteric vessels patent proximally. Enlarged porta hepatis lymph nodes again seen, measuring up to 2.4 cm in maximal diameter, perhaps slightly increased in size from previous. No other new adenopathy. Reproductive: Prostate normal. Other: Focal skin breakdown noted along the midline incision. No loculated collections or other complication along the surgical incision itself. Mild diffuse anasarca noted. Musculoskeletal: No acute osseous abnormality. No discrete lytic or blastic osseous lesions. CT LUMBAR SPINE: Segmentation: Standard. Lowest well-formed disc labeled the L5-S1 level. Alignment: Straightening with slight reversal of the normal  lumbar lordosis. Trace retrolisthesis of L3 on L4 and L4 on L5, likely chronic and facet mediated. No malalignment. Vertebrae: Vertebral body heights maintained without evidence for acute or chronic fracture. No discrete lytic or blastic osseous lesions. Visualized sacrum and pelvis intact. SI joints approximated and symmetric. Paraspinal and other soft tissues: Paraspinous soft tissues demonstrate no acute finding. Disc levels: T12-L1: Disc desiccation with mild diffuse disc bulge.  No stenosis. L1-2: Chronic intervertebral disc space narrowing with disc desiccation and diffuse disc bulge. Right far lateral endplate osteophytic spurring. No significant stenosis. L2-3: Diffuse disc bulge with intervertebral disc space narrowing. Prominent reactive endplate changes with endplate osteophytic spurring. Mild disc desiccation. Mild bilateral facet hypertrophy. Resultant mild spinal stenosis. Mild to moderate right L2 foraminal narrowing. L3-4: Diffuse disc bulge with disc desiccation and intervertebral disc space narrowing. Reactive endplate changes. Mild facet hypertrophy. Resultant moderate spinal stenosis. Foramina remain patent. L4-5: Diffuse disc bulge with associated annular calcification. Moderate facet hypertrophy. Resultant severe spinal stenosis. Mild to moderate bilateral L4 foraminal narrowing. L5-S1: Chronic intervertebral disc space narrowing with diffuse disc bulge and disc desiccation. Reactive endplate changes with marginal endplate osteophytic spurring. No significant spinal stenosis. Advanced bilateral L5 foraminal narrowing. IMPRESSION: CT ABDOMEN AND PELVIS IMPRESSION 1. Postoperative changes from recent right hemicolectomy with partial small bowel resection. Diffuse small bowel dilatation  to the level of the surgical anastomosis favored to reflect postoperative ileus, although an obstructive process would be difficult to exclude, and could be considered in the correct clinical setting. 2. Small  moderate volume free fluid within the abdomen and pelvis with persistent small volume free intraperitoneal air. Findings are somewhat greater than expected for normal expected postoperative changes given the patient is postoperative day 10 from prior surgery. Findings raise the possibility for an enteric leak, although source is not definitely visualized on this exam. No loculated collections or intra-abdominal abscess identified. 3. Pulmonary nodules at the left lung base as above, slightly increased in size, suggesting mildly progressed disease. Scattered mediastinal and upper abdominal adenopathy stable to slightly increased in size as well. 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 4. Aortic atherosclerosis. CT LUMBAR SPINE IMPRESSION 1. No CT evidence for acute abnormality within the lumbar spine. 2. 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 3. 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 4. No evidence for osseous metastatic disease. Results were discussed by telephone at the time of interpretation on 03/27/2019 at 11:57 pm with Dr. Lennice Sites. Electronically Signed   By: Jeannine Boga M.D.   On: 03/27/2019 23:59   Ct Abdomen Pelvis W Contrast  Result Date: 03/16/2019 CLINICAL DATA:  Weakness with lower abdominal pain 1 day. Diagnosed with lung cancer last year. EXAM: CT ANGIOGRAPHY CHEST CT ABDOMEN AND PELVIS WITH CONTRAST TECHNIQUE: Multidetector CT imaging of the chest was performed using the standard protocol during bolus administration of intravenous contrast. Multiplanar CT image reconstructions and MIPs were obtained to evaluate the vascular anatomy. Multidetector CT imaging of the abdomen and pelvis was performed using the standard protocol during bolus administration of intravenous contrast. CONTRAST:  122m OMNIPAQUE IOHEXOL 350 MG/ML  SOLN COMPARISON:  Chest CT 08/30/2018 and abdominal CT 06/30/2016 FINDINGS: CTA CHEST FINDINGS Cardiovascular: Heart is normal size. Thoracic aorta is normal in caliber with minimal calcified plaque present. Pulmonary arterial system is within normal. Persistent obstruction of the right upper lobar arteries. Remaining vascular structures are unremarkable. Mediastinum/Nodes: Significant decrease in size in patient's previously seen anterior mediastinal mass over the right side of the anterior mediastinum with mild residual density in this region extending down to the right hilum. This residual mass measures approximately 2.9 x 3.9 cm in transverse in AP dimension (previously 13.1 x 10.5 cm). The right hilar component of this mass measures 2.2 cm in AP dimension (previously 5.7 cm). 1.4 cm left infrahilar node. New 2.4 cm soft tissue nodule abutting the right heart border and new 2 cm soft tissue nodule abutting the left anterolateral heart border just above the level of the ventricular septum. Lungs/Pleura: Lungs are adequately inflated and demonstrate resolution of several small right-sided pulmonary nodules. There is new opacification with bronchiectatic change over the medial right lung with areas of nodularity likely post radiation change. Stable nodule over the medial left upper lobe measuring 12 x 15 mm. Increase in size of a nodule over the posterior left lower lobe measuring 13 mm. Minimal emphysematous disease. No evidence of pleural effusion. Musculoskeletal: Mild degenerative change of the spine. Review of the MIP images confirms the above findings. CT ABDOMEN and PELVIS FINDINGS Hepatobiliary: 2.3 cm hypodense mass over the posterior segment right lobe of the liver as well as 1.3 cm hypodense mass over the anterior right lobe of the liver likely representing metastatic disease. Subcentimeter hypodensity over the right  lobe which may represent a small cyst versus metastatic disease. Previous  cholecystectomy. Biliary tree is unremarkable. Pancreas: Normal Spleen: Normal. Adrenals/Urinary Tract: New 1.8 cm left adrenal nodule likely metastatic focus. Right adrenal gland is normal. Kidneys are normal in size without hydronephrosis or nephrolithiasis. There are a couple subcentimeter right renal cortical hypodensities too small to characterize but likely cysts. Ureters and bladder are normal. Stomach/Bowel: Stomach is within normal. There are multiple fluid and air-filled dilated small bowel loops measuring up to 5.4 cm in diameter. There is an abrupt transition 0.8 in the midline abdomen at the level of the umbilicus with there is an ileo ileal intussusception possibly due to a small lead point mass. There is a second ileocolic intussusception in the right lower quadrant likely due to additional bowel masses. This ileocolic intussusception extends to the hepatic flexure. The colon is decompressed distal to the hepatic flexure. Vascular/Lymphatic: Moderate calcified plaque over the abdominal aorta. Portacaval lymph node measuring 2.2 cm in diameter with adjacent celiac axis node measuring 1.8 cm in diameter. Reproductive: Normal. Other: No significant free fluid or free peritoneal air. Musculoskeletal: Degenerative change of the spine and hips. Review of the MIP images confirms the above findings. IMPRESSION: 1. Small bowel obstruction with abrupt transition point in the midline abdomen at the level of the umbilicus due to ileo ileal intussusception likely secondary to small bowel mass which is likely metastatic disease. There is a second intussusception in the right lower quadrant as there is an ileocolic intussusception extending to the hepatic flexure likely due to multiple bowel masses. 2. Significant decrease in size of patient's anterior mediastinal mass as the residual mass measures approximately 2.9 x 3.9 cm (previously 13.1 x 10.5 cm). Decreased size of right hilar component of this mass. 1.4 cm  low left infrahilar lymph node. New metastatic foci abutting the right and left heart border as described. Resolution of several small right pulmonary nodules with stable and increased size of left pulmonary nodules as described. 3. 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 4. Two new hypodense liver masses with the larger measuring 2.3 cm over the right lobe likely metastatic disease. New 1.8 cm left adrenal nodule likely metastatic disease. Worsening adenopathy in the region of the celiac axis and portacaval region likely metastatic disease. 5. A few subcentimeter right renal cortical hypodensities too small to characterize but likely cysts. 6. Aortic Atherosclerosis (ICD10-I70.0) and Emphysema (ICD10-J43.9). These results were called by telephone at the time of interpretation on 03/16/2019 at 5:15 pm to PA, [facility] , who verbally acknowledged these results. Electronically Signed   By: Marin Olp M.D.   On: 03/16/2019 17:16   Ct L-spine No Charge  Result Date: 03/27/2019 CLINICAL DATA:  Initial evaluation for acute abdominal and back pain. EXAM: CT ABDOMEN AND PELVIS WITHOUT CONTRAST TECHNIQUE: Multidetector CT imaging of the abdomen and pelvis was performed following the standard protocol without IV contrast. COMPARISON:  Comparison made with prior CT from 03/16/2019. FINDINGS: CT ABDOMEN AND PELVIS Lower chest: Small layering right pleural effusion with associated atelectasis. Probable postradiation changes partially visualize within the right perihilar region, grossly similar. 2.8 cm pleural base nodule at the lingula is increased in size (series 6, image 21). Additional 15 mm left lower lobe nodule also increased in size (series 6, image 25). 12 mm node noted near the AE recess, similar. Right cardiophrenic implant measures up to 2.6 cm, perhaps slightly increased in size from previous. Trace  pericardial effusion noted.  Hepatobiliary: 2.3 cm hypodense lesion at the right hepatic dome noted, stable, concerning for metastatic disease. Additional 15 mm lesion slightly inferiorly within the right hepatic lobe also similar. Additional subcentimeter hypodensity within the right hepatic lobe too small the characterize and could reflect a small cyst versus metastatic implant, stable from previous. Gallbladder surgically absent. Mild scattered intrahepatic biliary dilatation similar to previous. Pancreas: Pancreas within normal limits. Spleen: Spleen within normal limits. Adrenals/Urinary Tract: Right adrenal gland is normal. 19 mm left adrenal nodule noted, stable. Kidneys equal in size with symmetric enhancement. No nephrolithiasis, hydronephrosis or focal enhancing renal mass. No hydroureter. Bladder within normal limits. Stomach/Bowel: Stomach largely decompressed without acute finding. Postoperative changes from interval right hemicolectomy with partial small bowel resection and ileocolonic anastomosis. Small bowel is diffusely dilated and fluid-filled to the level of the surgical anastomosis within the right lower quadrant, measuring up to 5 cm in maximal diameter. Few scattered internal air-fluid levels. Mild gaseous distension of the colon. Imaging findings favored to reflect postoperative ileus. Few scattered colonic diverticula noted without evidence for acute diverticulitis. Small to moderate volume free fluid present within the abdomen and pelvis, measuring simple fluid density. Additionally, scattered small volume free air seen within the abdomen, somewhat greater than expected for approximately 10 days post surgery. Findings raise the possibility for an ongoing leak, source not identified. No discrete loculated collections or intra-abdominal abscess. Vascular/Lymphatic: Moderate to advanced aorto bi-iliac atherosclerotic disease. Infrarenal aorta ectatic measuring up to 2.8 cm in transverse diameter. Mesenteric vessels patent  proximally. Enlarged porta hepatis lymph nodes again seen, measuring up to 2.4 cm in maximal diameter, perhaps slightly increased in size from previous. No other new adenopathy. Reproductive: Prostate normal. Other: Focal skin breakdown noted along the midline incision. No loculated collections or other complication along the surgical incision itself. Mild diffuse anasarca noted. Musculoskeletal: No acute osseous abnormality. No discrete lytic or blastic osseous lesions. CT LUMBAR SPINE: Segmentation: Standard. Lowest well-formed disc labeled the L5-S1 level. Alignment: Straightening with slight reversal of the normal lumbar lordosis. Trace retrolisthesis of L3 on L4 and L4 on L5, likely chronic and facet mediated. No malalignment. Vertebrae: Vertebral body heights maintained without evidence for acute or chronic fracture. No discrete lytic or blastic osseous lesions. Visualized sacrum and pelvis intact. SI joints approximated and symmetric. Paraspinal and other soft tissues: Paraspinous soft tissues demonstrate no acute finding. Disc levels: T12-L1: Disc desiccation with mild diffuse disc bulge.  No stenosis. L1-2: Chronic intervertebral disc space narrowing with disc desiccation and diffuse disc bulge. Right far lateral endplate osteophytic spurring. No significant stenosis. L2-3: Diffuse disc bulge with intervertebral disc space narrowing. Prominent reactive endplate changes with endplate osteophytic spurring. Mild disc desiccation. Mild bilateral facet hypertrophy. Resultant mild spinal stenosis. Mild to moderate right L2 foraminal narrowing. L3-4: Diffuse disc bulge with disc desiccation and intervertebral disc space narrowing. Reactive endplate changes. Mild facet hypertrophy. Resultant moderate spinal stenosis. Foramina remain patent. L4-5: Diffuse disc bulge with associated annular calcification. Moderate facet hypertrophy. Resultant severe spinal stenosis. Mild to moderate bilateral L4 foraminal narrowing.  L5-S1: Chronic intervertebral disc space narrowing with diffuse disc bulge and disc desiccation. Reactive endplate changes with marginal endplate osteophytic spurring. No significant spinal stenosis. Advanced bilateral L5 foraminal narrowing. IMPRESSION: CT ABDOMEN AND PELVIS IMPRESSION 1. Postoperative changes from recent right hemicolectomy with partial small bowel resection. Diffuse small bowel dilatation to the level of the surgical anastomosis favored to reflect postoperative ileus, although an obstructive process would be  difficult to exclude, and could be considered in the correct clinical setting. 2. Small moderate volume free fluid within the abdomen and pelvis with persistent small volume free intraperitoneal air. Findings are somewhat greater than expected for normal expected postoperative changes given the patient is postoperative day 10 from prior surgery. Findings raise the possibility for an enteric leak, although source is not definitely visualized on this exam. No loculated collections or intra-abdominal abscess identified. 3. Pulmonary nodules at the left lung base as above, slightly increased in size, suggesting mildly progressed disease. Scattered mediastinal and upper abdominal adenopathy stable to slightly increased in size as well. 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 4. Aortic atherosclerosis. CT LUMBAR SPINE IMPRESSION 1. No CT evidence for acute abnormality within the lumbar spine. 2. 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 3. 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 4. No evidence for osseous metastatic disease. Results were discussed by telephone at the time of interpretation on 03/27/2019 at 11:57 pm with Dr. Lennice Sites. Electronically Signed   By: Jeannine Boga M.D.   On: 03/27/2019 23:59   Dg Abd Portable  1v  Result Date: 03/24/2019 CLINICAL DATA:  Nausea and vomiting EXAM: PORTABLE ABDOMEN - 1 VIEW COMPARISON:  03/19/2019 FINDINGS: Postsurgical changes are again noted. Multiple ingested tablets are noted within the stomach. Scattered large and small bowel gas is seen. No definitive obstructive changes seen. No bony abnormality is noted. IMPRESSION: Scattered large and small bowel gas without obstructive change. Nasogastric catheter is been removed. Electronically Signed   By: Inez Catalina M.D.   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 The patient underwent a palliative course of radiotherapy to the large right upper lobe and mediastinal mass under the care of Dr. Sondra Come with improvement of the SVC symptoms and he is feeling a little bit better. The patient is very noncompliant.  He missed his appointment for the PET scan. The patient was started on treatment with immunotherapy with Keytruda 200 mg IV every 3 weeks status post 1 cycle in October 2019.  He refused to take any further treatment since that time. I had a lengthy discussion with the patient today about his current disease status and treatment options. I recommended for the patient to resume his treatment with Keytruda every 3 weeks.  The patient had good response to the palliative radiotherapy in the first cycle of Keytruda but he firmly refused any systemic treatment. I explained to the patient that unfortunately there is no other available treatment options for him at this point other than systemic immunotherapy or systemic chemotherapy but he declined this option. I recommended for the patient to consider palliative care and hospice but he also declined he would like to  continue with his current observation and monitoring. I will see the patient on as-needed basis or sooner if he changes his mind regarding treatment. The patient voices understanding of current disease status and treatment options and is in agreement with the current care plan. All questions were answered. The patient knows to call the clinic with any problems, questions or concerns. We can certainly see the patient much sooner if necessary.  I  spent 15 minutes counseling the patient face to face. The total time spent in the appointment was 25 minutes.  Disclaimer: This note was dictated with voice recognition software. Similar sounding words can inadvertently be transcribed and may not be corrected upon review.

## 2019-04-07 ENCOUNTER — Ambulatory Visit: Payer: Medicare Other | Admitting: Internal Medicine

## 2019-04-07 ENCOUNTER — Other Ambulatory Visit: Payer: Medicare Other

## 2019-08-24 IMAGING — CR DG CHEST 2V
2 series · 2 of 2 positions shown · non-contrast
Comparison: 08/06/2006

CLINICAL DATA: Acute shortness of breath. Fatigue and
nausea/vomiting.

EXAM:
CHEST - 2 VIEW

[chest lat]
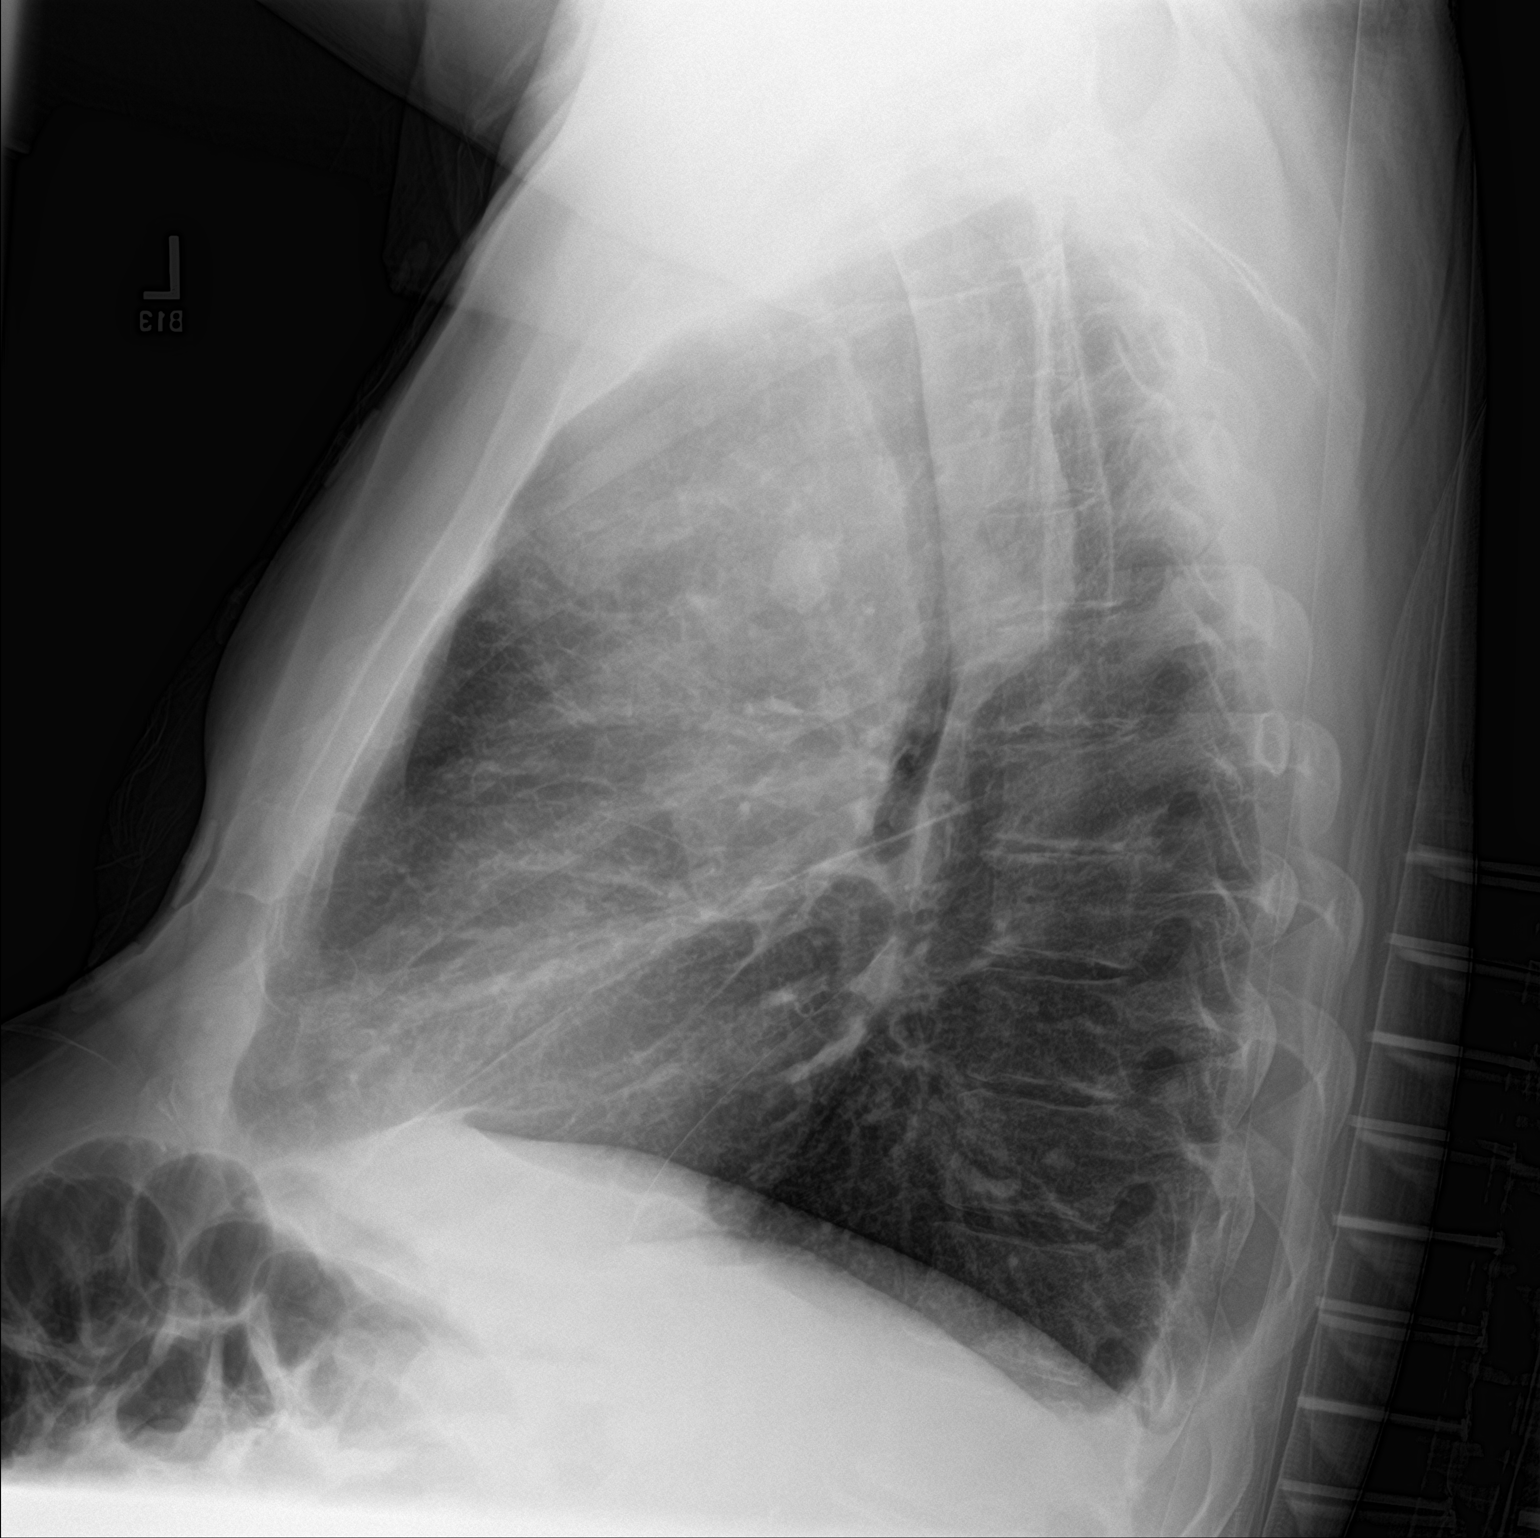

[chest ap]
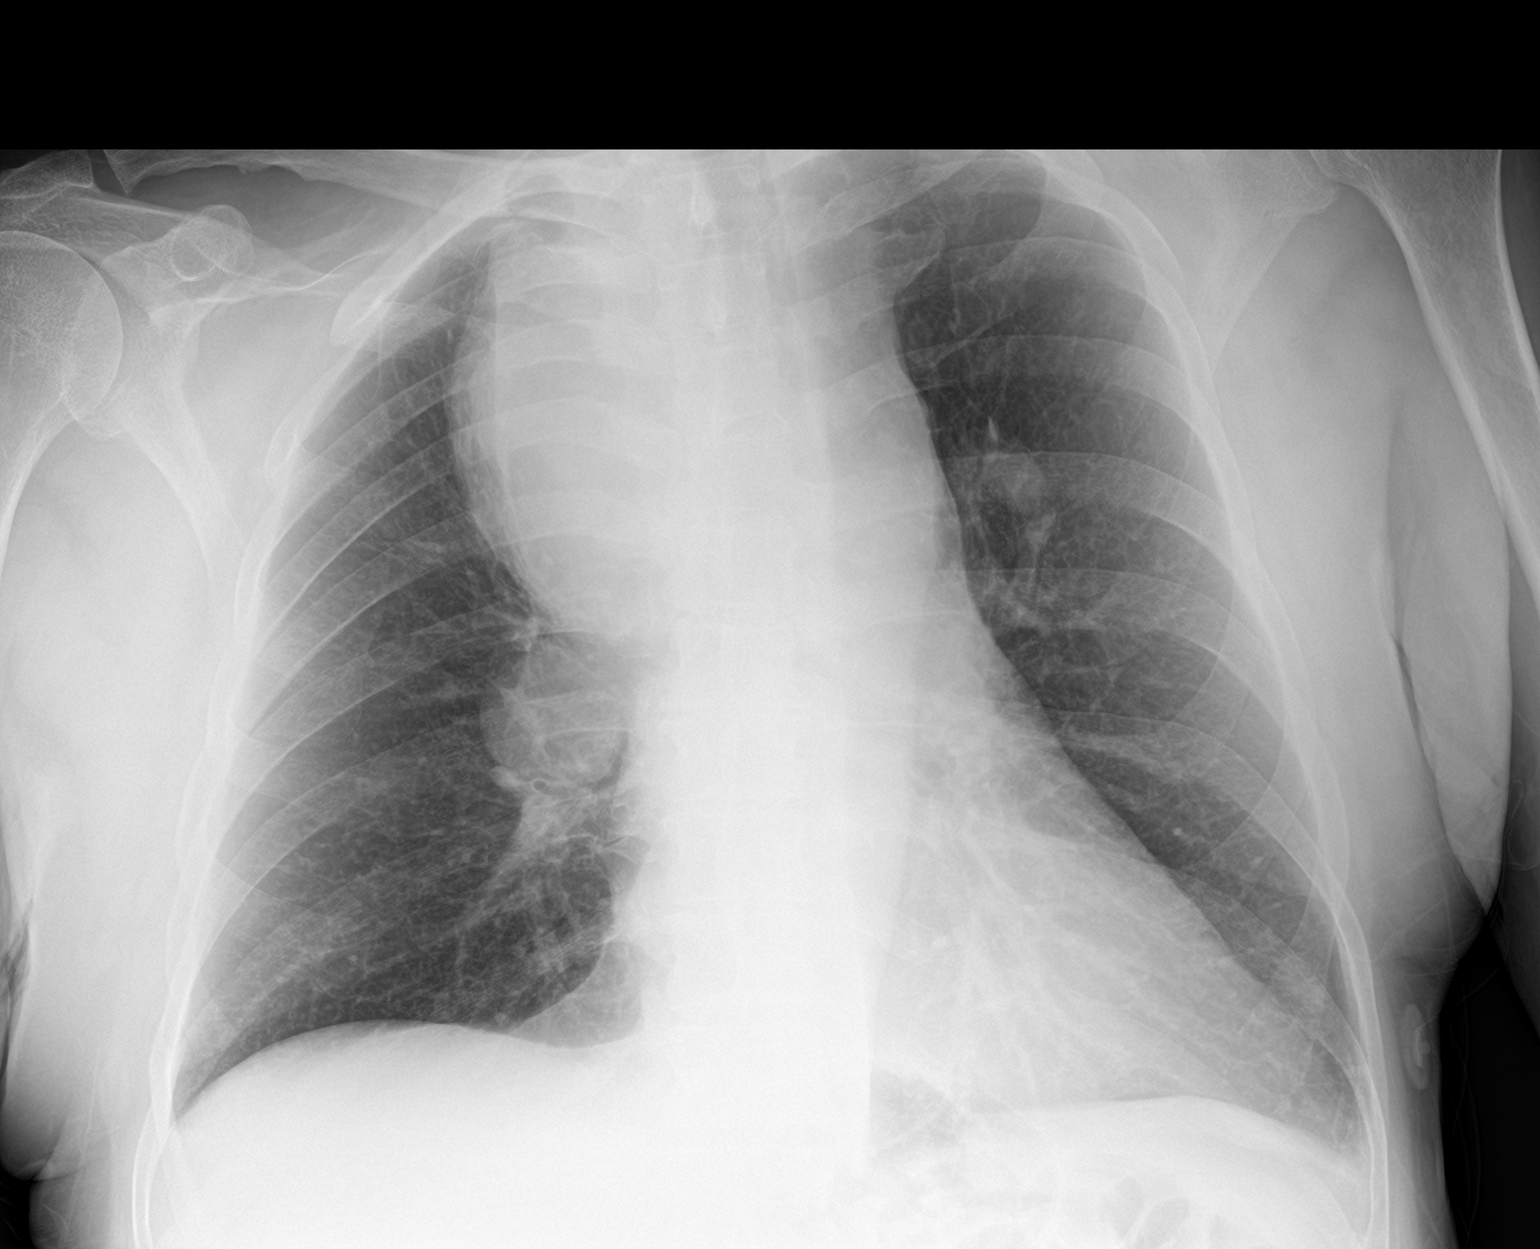

[2 of 2 positions shown; findings below may reference images not displayed]

FINDINGS: RIGHT mediastinal and RIGHT hilar masses are now identified.

A 1.5 cm nodular opacity overlying the UPPER LEFT lung noted.

The visualized lungs are otherwise clear.

No pleural effusion or pneumothorax.

No acute bony abnormalities are identified.
IMPRESSION: New RIGHT mediastinal and RIGHT hilar masses and possible LEFT lung
nodule. Chest CT with contrast recommended for further evaluation.

## 2019-09-01 IMAGING — CR DG CHEST 2V
2 series · 2 of 2 positions shown · non-contrast
Comparison: Chest CT and chest radiographs, 08/22/2018.

CLINICAL DATA: Patient has been diagnosed recently with melanoma,
and has been receiving radiation treatments. Patient was supposed to
get radiation today, but patient could not lay flat due to SOB.
Patient tachycardic and tachypnic in triage with tripoding

EXAM:
CHEST - 2 VIEW

[w chest lat]
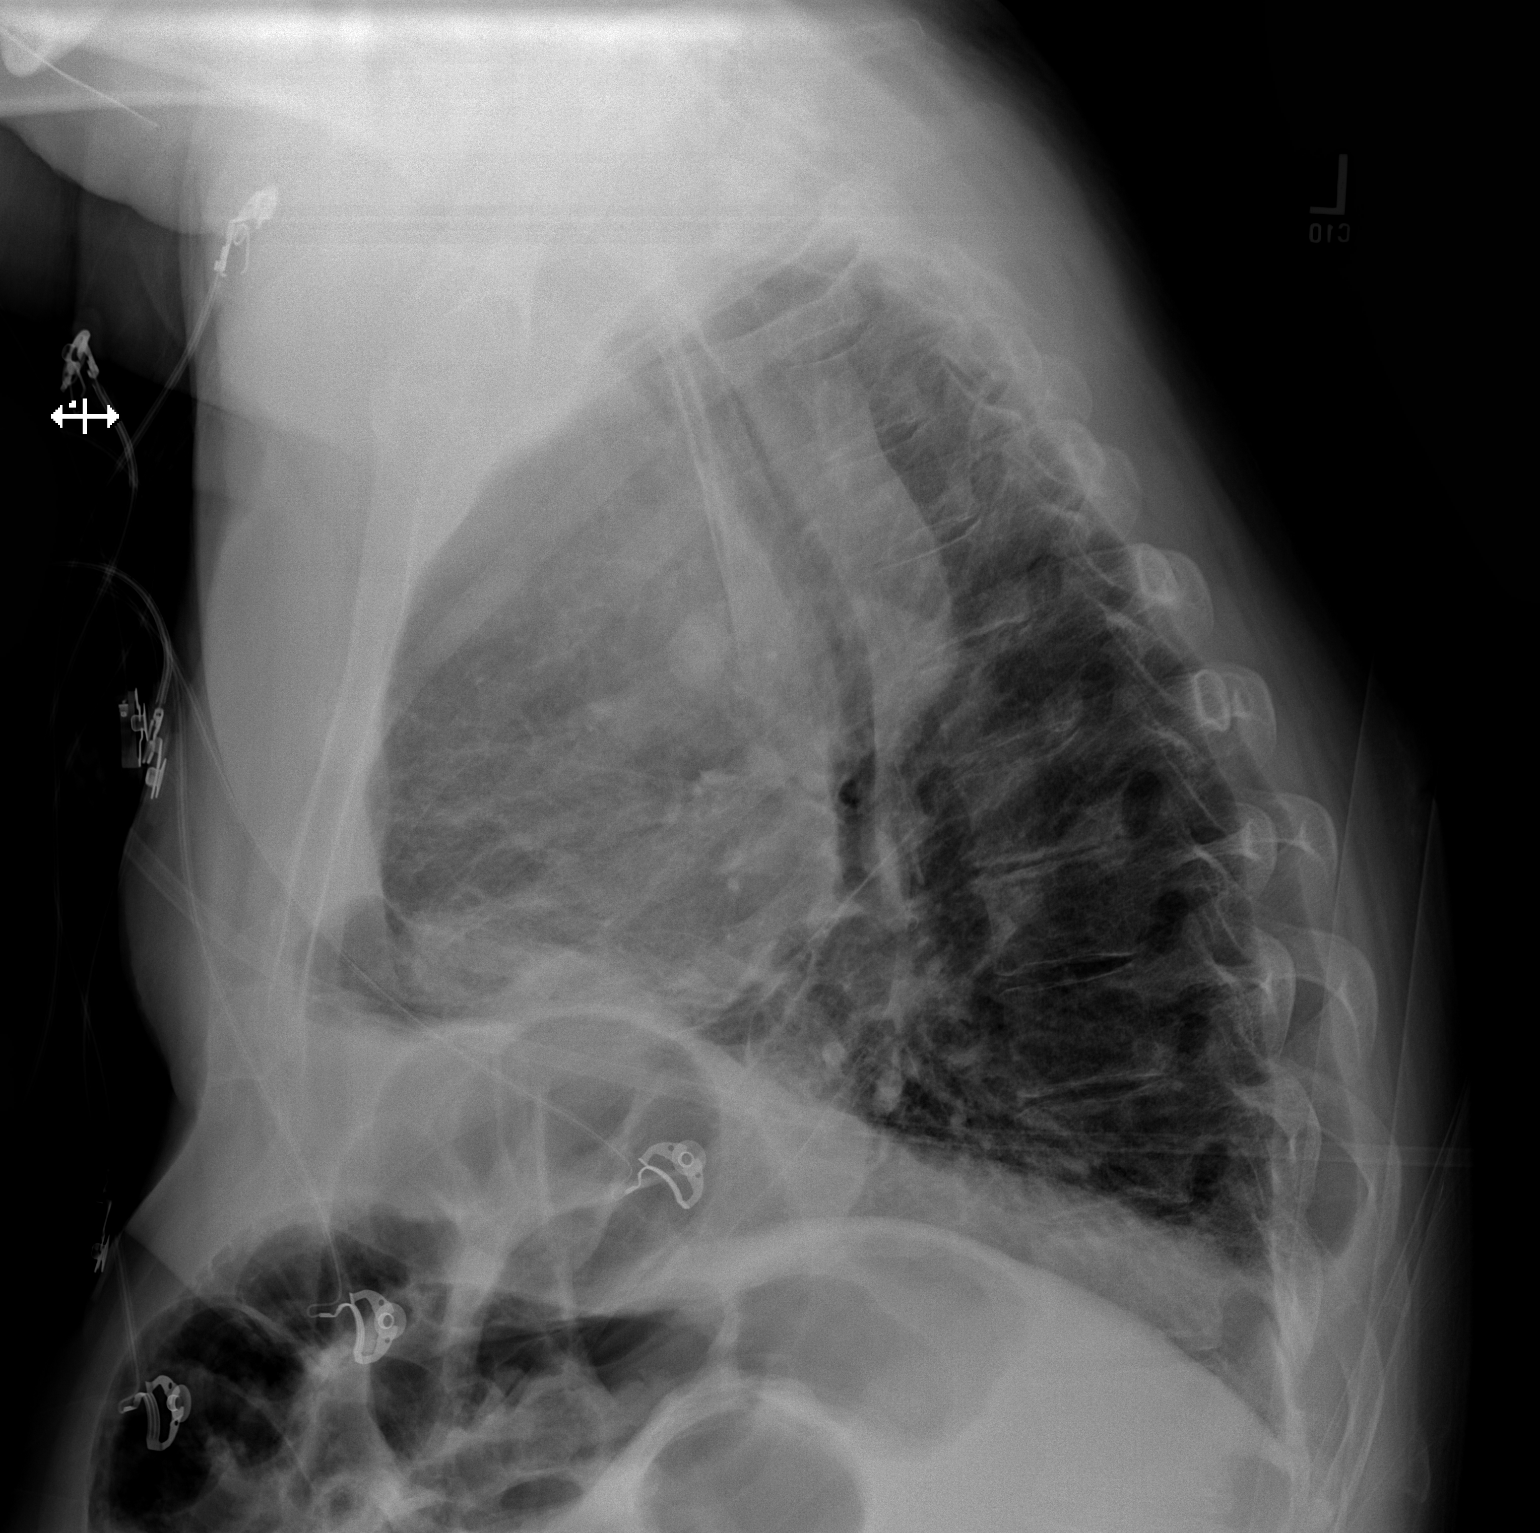

[x chest ap]
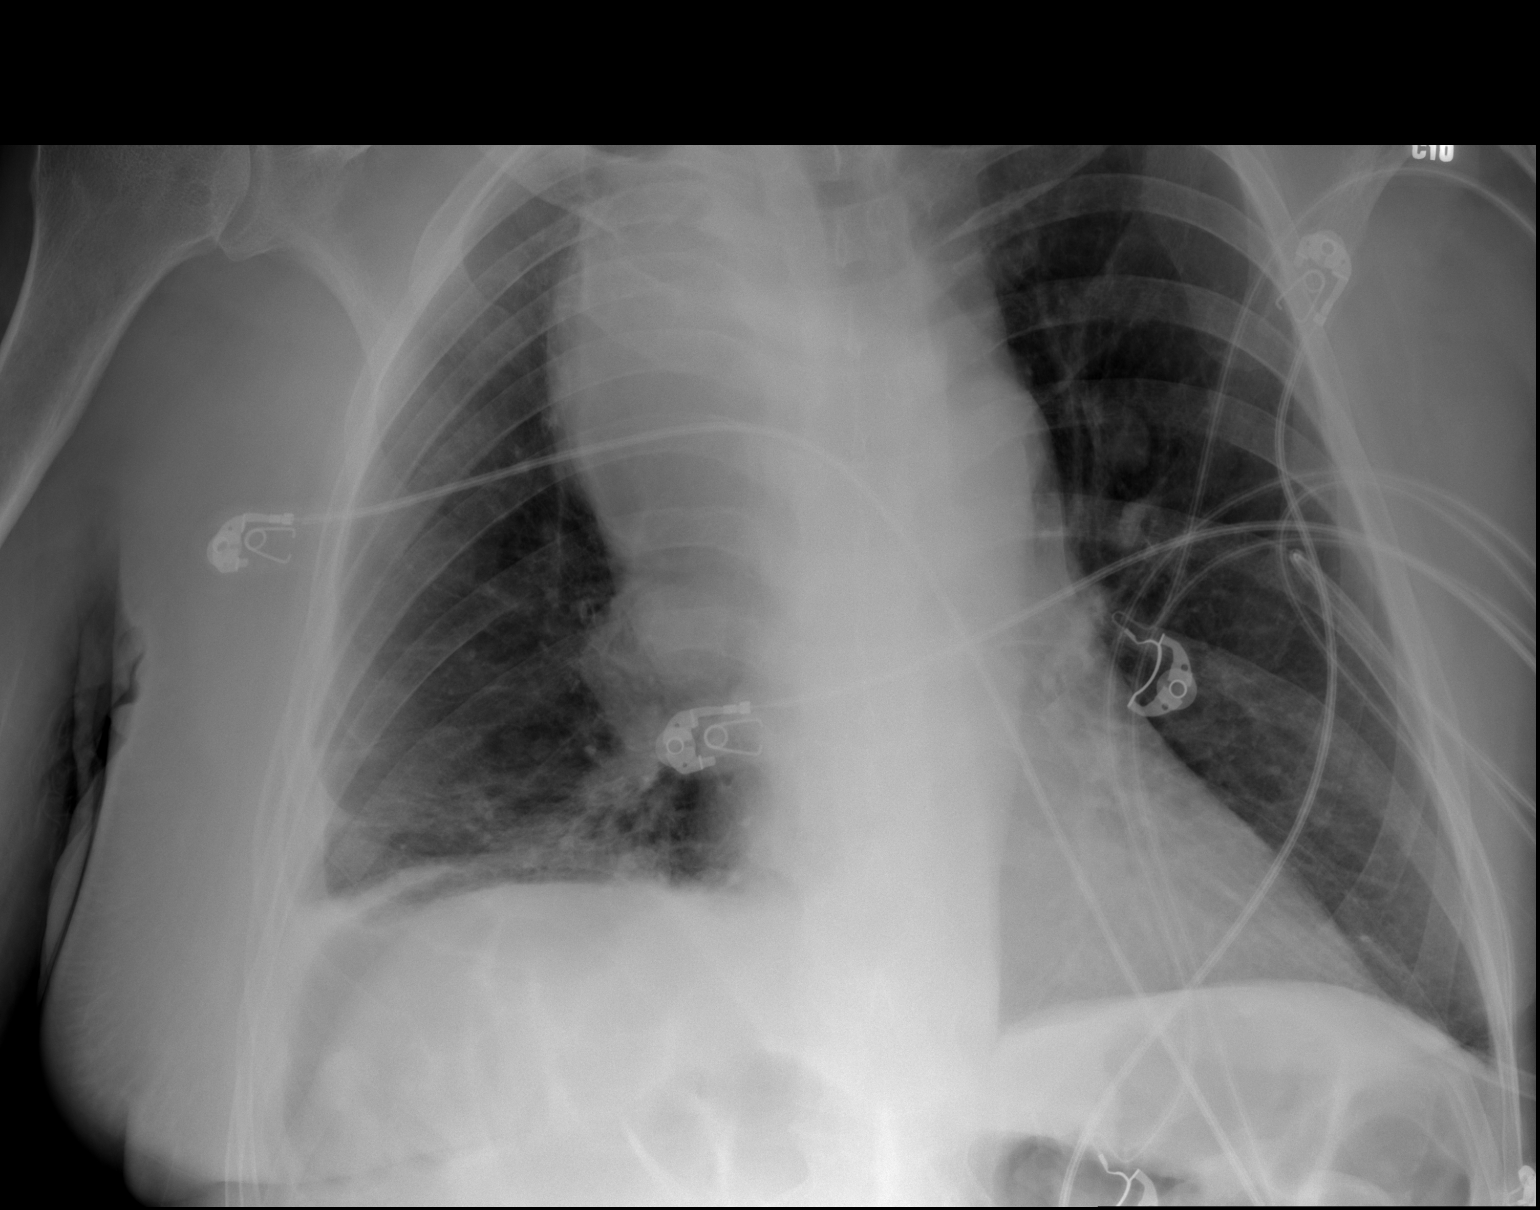

[2 of 2 positions shown; findings below may reference images not displayed]

FINDINGS: Large right mediastinal at contiguous right upper lobe mass,
contiguous right hilar mass and smaller nodule above the left hilar
structures in the left upper lobe are stable from the prior exams.

There is opacity at the right lung base that is new from the prior
chest radiograph and chest CT, likely atelectasis. Pneumonia is
possible. Remainder of the lungs is clear.

Cardiac silhouette is normal in size.

No pleural effusion or pneumothorax.

Skeletal structures are intact.
IMPRESSION: 1. New right lung base opacity, most likely atelectasis. Consider
pneumonia if there are consistent clinical findings.
2. No other change from the prior chest CT and chest radiograph.
Large right mediastinal and contiguous right hilar and upper lobe
mass consistent with carcinoma. Presumed metastatic lung nodule in
the central left upper lobe.

## 2019-10-02 DEATH — deceased

## 2020-03-28 IMAGING — CT CT LUMBAR SPINE WITHOUT CONTRAST
3 of 10 series · 10 of 33 positions shown, 11 images · IV contrast (ISOVUE)
Comparison: Comparison made with prior CT from 03/16/2019.

CLINICAL DATA: Initial evaluation for acute abdominal and back
pain.

EXAM:
CT ABDOMEN AND PELVIS WITHOUT CONTRAST
TECHNIQUE: Multidetector CT imaging of the abdomen and pelvis was performed
following the standard protocol without IV contrast.

[Series 4: coronal st · coronal · 0.99mm/px · 2 of 156 slices shown]
[im 52/156  bone]
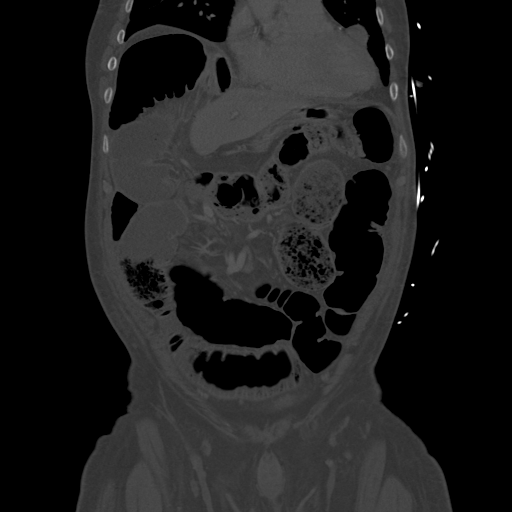
[im 104/156  bone]
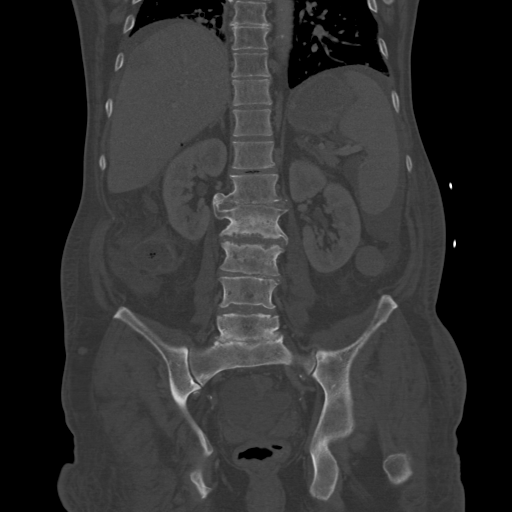

[Series 5: sagittal st · sagittal · 0.65mm/px · 5 of 216 slices shown]
[im 54/216  bone]
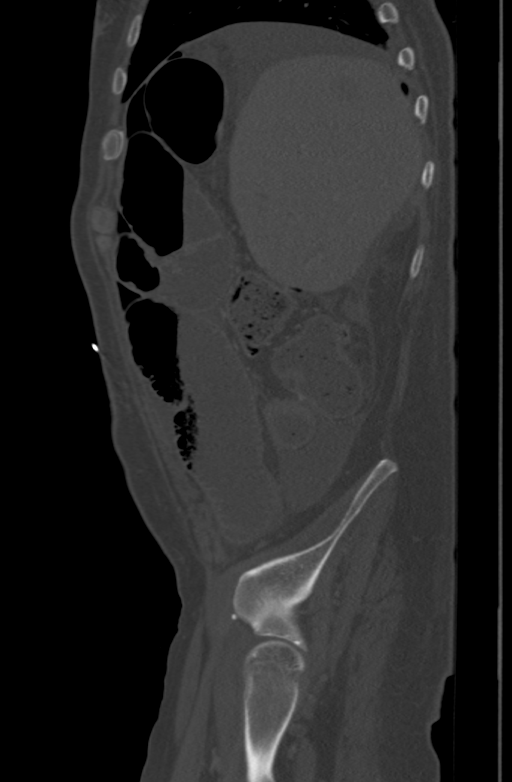
[im 81/216  bone]
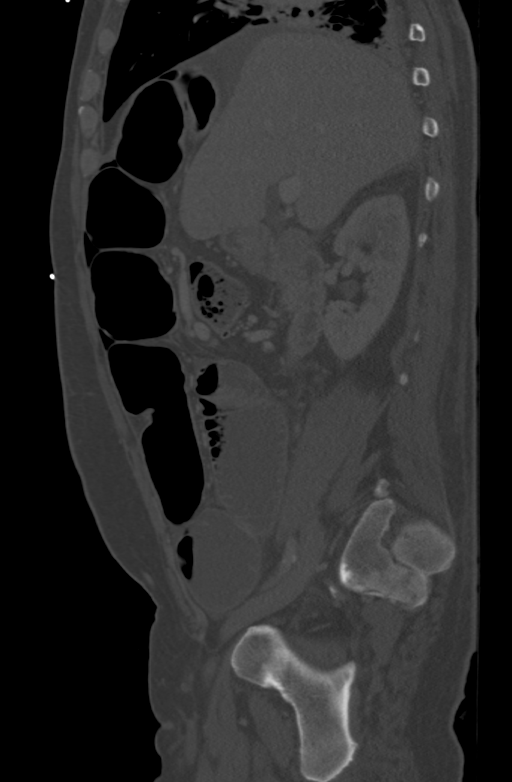
[im 108/216  bone]
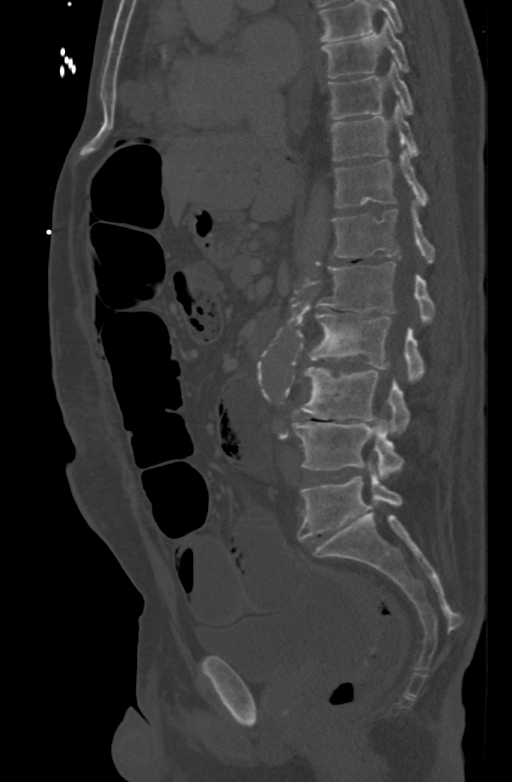
[im 135/216  bone]
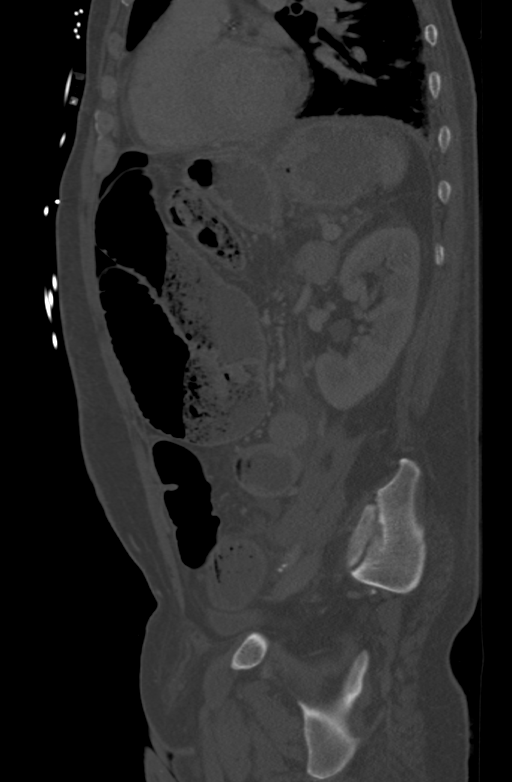
[im 162/216  bone]
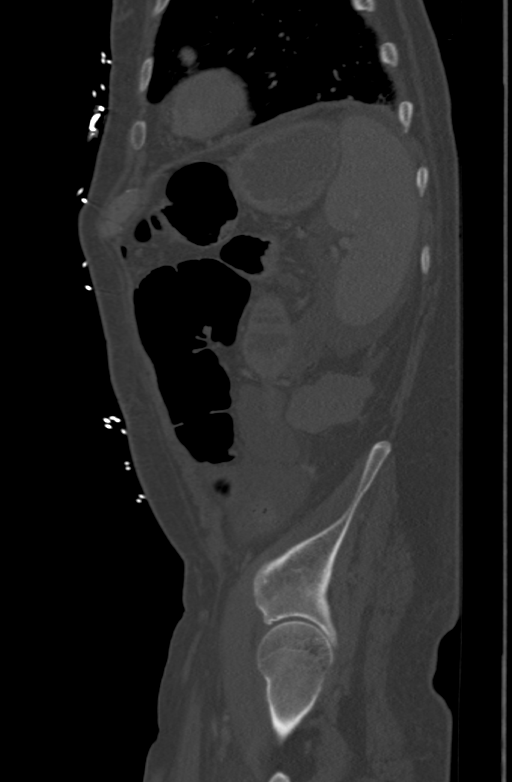

[Series 10: thins st · axial · 0.33mm/px · z∈[-412,-295]mm · 3 of 235 slices shown, 4 images]
[im 59/235  soft-tissue]
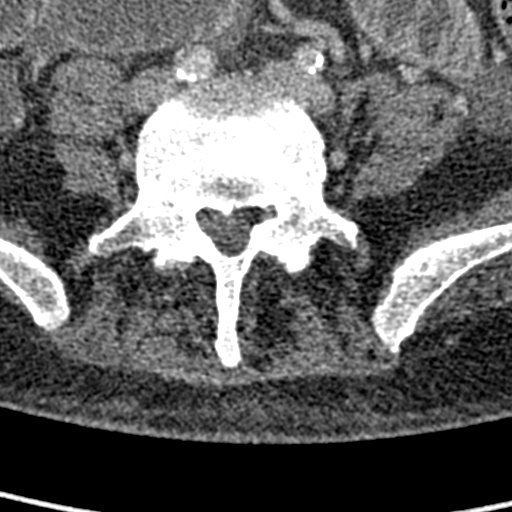
[im 59/235  bone]
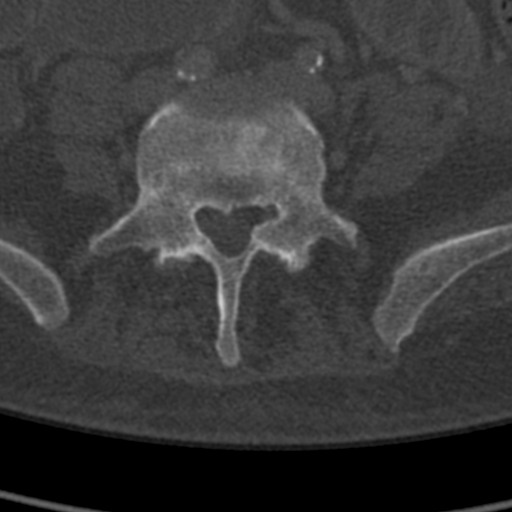
[im 118/235  bone]
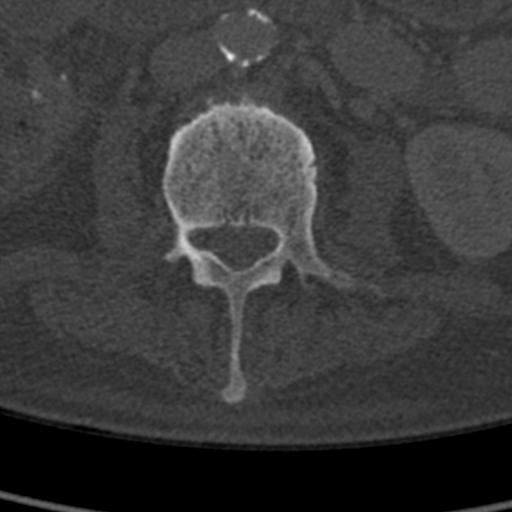
[im 176/235  bone]
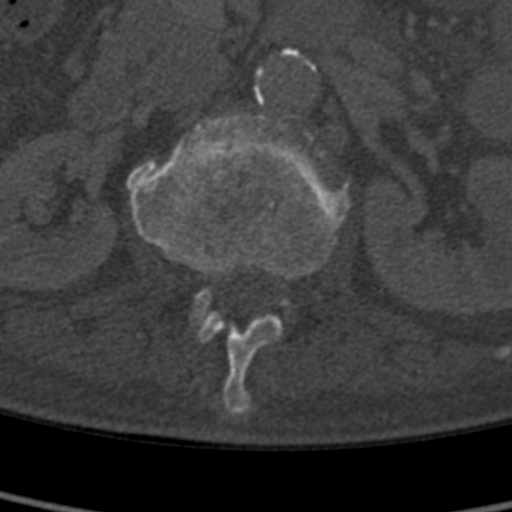

[10 of 33 positions shown; findings below may reference images not displayed]

FINDINGS: CT ABDOMEN AND PELVIS

Lower chest: Small layering right pleural effusion with associated
atelectasis. Probable postradiation changes partially visualize
within the right perihilar region, grossly similar. 2.8 cm pleural
base nodule at the lingula is increased in size (series 6, image
21). Additional 15 mm left lower lobe nodule also increased in size
(series 6, image 25). 12 mm node noted near the AE recess, similar.
Right cardiophrenic implant measures up to 2.6 cm, perhaps slightly
increased in size from previous. Trace pericardial effusion noted.

Hepatobiliary: 2.3 cm hypodense lesion at the right hepatic dome
noted, stable, concerning for metastatic disease. Additional 15 mm
lesion slightly inferiorly within the right hepatic lobe also
similar. Additional subcentimeter hypodensity within the right
hepatic lobe too small the characterize and could reflect a small
cyst versus metastatic implant, stable from previous. Gallbladder
surgically absent. Mild scattered intrahepatic biliary dilatation
similar to previous.

Pancreas: Pancreas within normal limits.

Spleen: Spleen within normal limits.

Adrenals/Urinary Tract: Right adrenal gland is normal. 19 mm left
adrenal nodule noted, stable. Kidneys equal in size with symmetric
enhancement. No nephrolithiasis, hydronephrosis or focal enhancing
renal mass. No hydroureter. Bladder within normal limits.

Stomach/Bowel: Stomach largely decompressed without acute finding.
Postoperative changes from interval right hemicolectomy with partial
small bowel resection and ileocolonic anastomosis. Small bowel is
diffusely dilated and fluid-filled to the level of the surgical
anastomosis within the right lower quadrant, measuring up to 5 cm in
maximal diameter. Few scattered internal air-fluid levels. Mild
gaseous distension of the colon. Imaging findings favored to reflect
postoperative ileus. Few scattered colonic diverticula noted without
evidence for acute diverticulitis. Small to moderate volume free
fluid present within the abdomen and pelvis, measuring simple fluid
density. Additionally, scattered small volume free air seen within
the abdomen, somewhat greater than expected for approximately 10
days post surgery. Findings raise the possibility for an ongoing
leak, source not identified. No discrete loculated collections or
intra-abdominal abscess.

Vascular/Lymphatic: Moderate to advanced aorto bi-iliac
atherosclerotic disease. Infrarenal aorta ectatic measuring up to
2.8 cm in transverse diameter. Mesenteric vessels patent proximally.

Enlarged porta hepatis lymph nodes again seen, measuring up to
cm in maximal diameter, perhaps slightly increased in size from
previous. No other new adenopathy.

Reproductive: Prostate normal.

Other: Focal skin breakdown noted along the midline incision. No
loculated collections or other complication along the surgical
incision itself. Mild diffuse anasarca noted.

Musculoskeletal: No acute osseous abnormality. No discrete lytic or
blastic osseous lesions.

CT LUMBAR SPINE:

Segmentation: Standard. Lowest well-formed disc labeled the L5-S1
level.

Alignment: Straightening with slight reversal of the normal lumbar
lordosis. Trace retrolisthesis of L3 on L4 and L4 on L5, likely
chronic and facet mediated. No malalignment.

Vertebrae: Vertebral body heights maintained without evidence for
acute or chronic fracture. No discrete lytic or blastic osseous
lesions. Visualized sacrum and pelvis intact. SI joints approximated
and symmetric.

Paraspinal and other soft tissues: Paraspinous soft tissues
demonstrate no acute finding.

Disc levels:

T12-L1: Disc desiccation with mild diffuse disc bulge.  No stenosis.

L1-2: Chronic intervertebral disc space narrowing with disc
desiccation and diffuse disc bulge. Right far lateral endplate
osteophytic spurring. No significant stenosis.

L2-3: Diffuse disc bulge with intervertebral disc space narrowing.
Prominent reactive endplate changes with endplate osteophytic
spurring. Mild disc desiccation. Mild bilateral facet hypertrophy.
Resultant mild spinal stenosis. Mild to moderate right L2 foraminal
narrowing.

L3-4: Diffuse disc bulge with disc desiccation and intervertebral
disc space narrowing. Reactive endplate changes. Mild facet
hypertrophy. Resultant moderate spinal stenosis. Foramina remain
patent.

L4-5: Diffuse disc bulge with associated annular calcification.
Moderate facet hypertrophy. Resultant severe spinal stenosis. Mild
to moderate bilateral L4 foraminal narrowing.

L5-S1: Chronic intervertebral disc space narrowing with diffuse disc
bulge and disc desiccation. Reactive endplate changes with marginal
endplate osteophytic spurring. No significant spinal stenosis.
Advanced bilateral L5 foraminal narrowing.
IMPRESSION: CT ABDOMEN AND PELVIS IMPRESSION

1. Postoperative changes from recent right hemicolectomy with
partial small bowel resection. Diffuse small bowel dilatation to the
level of the surgical anastomosis favored to reflect postoperative
ileus, although an obstructive process would be difficult to
exclude, and could be considered in the correct clinical setting.
2. Small moderate volume free fluid within the abdomen and pelvis
with persistent small volume free intraperitoneal air. Findings are
somewhat greater than expected for normal expected postoperative
changes given the patient is postoperative day 10 from prior
surgery. Findings raise the possibility for an enteric leak,
although source is not definitely visualized on this exam. No
loculated collections or intra-abdominal abscess identified.
3. Pulmonary nodules at the left lung base as above, slightly
increased in size, suggesting mildly progressed disease. Scattered
mediastinal and upper abdominal adenopathy stable to slightly
increased in size as well. Probable intrahepatic and left adrenal
metastases not significantly changed.
4. Aortic atherosclerosis.

CT LUMBAR SPINE IMPRESSION

1. No CT evidence for acute abnormality within the lumbar spine.
2. Moderate multilevel degenerative spondylolysis with resultant
moderate to severe spinal stenosis at L3-4 and L4-5.
3. Multifactorial degenerative changes with resultant advanced
bilateral L5 foraminal stenosis.
4. No evidence for osseous metastatic disease.

Results were discussed by telephone at the time of interpretation on
03/27/2019 at [DATE] with Dr. LUDMILA DE TORRES.
